# Patient Record
Sex: Female | Born: 1950 | Race: White | Hispanic: No | Marital: Married | Smoking: Current every day smoker
Health system: Southern US, Community
[De-identification: ages and names within clinical notes are randomized; demographics above are authoritative.]

## PROBLEM LIST (undated history)

## (undated) DIAGNOSIS — R634 Abnormal weight loss: Secondary | ICD-10-CM

## (undated) DIAGNOSIS — F419 Anxiety disorder, unspecified: Secondary | ICD-10-CM

## (undated) DIAGNOSIS — R0602 Shortness of breath: Secondary | ICD-10-CM

## (undated) DIAGNOSIS — K635 Polyp of colon: Secondary | ICD-10-CM

## (undated) DIAGNOSIS — C3490 Malignant neoplasm of unspecified part of unspecified bronchus or lung: Secondary | ICD-10-CM

## (undated) DIAGNOSIS — I509 Heart failure, unspecified: Secondary | ICD-10-CM

## (undated) DIAGNOSIS — R519 Other chronic pain: Secondary | ICD-10-CM

## (undated) DIAGNOSIS — Z923 Personal history of irradiation: Secondary | ICD-10-CM

## (undated) DIAGNOSIS — M1990 Unspecified osteoarthritis, unspecified site: Secondary | ICD-10-CM

## (undated) DIAGNOSIS — M545 Low back pain, unspecified: Secondary | ICD-10-CM

## (undated) DIAGNOSIS — J449 Chronic obstructive pulmonary disease, unspecified: Secondary | ICD-10-CM

## (undated) DIAGNOSIS — Z9981 Dependence on supplemental oxygen: Secondary | ICD-10-CM

## (undated) DIAGNOSIS — F1910 Other psychoactive substance abuse, uncomplicated: Secondary | ICD-10-CM

## (undated) DIAGNOSIS — S270XXA Traumatic pneumothorax, initial encounter: Secondary | ICD-10-CM

## (undated) DIAGNOSIS — M8700 Idiopathic aseptic necrosis of unspecified bone: Secondary | ICD-10-CM

## (undated) DIAGNOSIS — R51 Headache: Secondary | ICD-10-CM

## (undated) DIAGNOSIS — G8929 Other chronic pain: Secondary | ICD-10-CM

## (undated) DIAGNOSIS — J441 Chronic obstructive pulmonary disease with (acute) exacerbation: Secondary | ICD-10-CM

## (undated) DIAGNOSIS — I10 Essential (primary) hypertension: Secondary | ICD-10-CM

## (undated) DIAGNOSIS — F329 Major depressive disorder, single episode, unspecified: Secondary | ICD-10-CM

## (undated) DIAGNOSIS — F3289 Other specified depressive episodes: Secondary | ICD-10-CM

## (undated) DIAGNOSIS — F17200 Nicotine dependence, unspecified, uncomplicated: Secondary | ICD-10-CM

## (undated) HISTORY — DX: Abnormal weight loss: R63.4

## (undated) HISTORY — DX: Low back pain: M54.5

## (undated) HISTORY — DX: Other specified depressive episodes: F32.89

## (undated) HISTORY — DX: Other psychoactive substance abuse, uncomplicated: F19.10

## (undated) HISTORY — DX: Chronic obstructive pulmonary disease with (acute) exacerbation: J44.1

## (undated) HISTORY — PX: HIP SURGERY: SHX245

## (undated) HISTORY — PX: BACK SURGERY: SHX140

## (undated) HISTORY — PX: VESICOVAGINAL FISTULA CLOSURE W/ TAH: SUR271

## (undated) HISTORY — DX: Personal history of irradiation: Z92.3

## (undated) HISTORY — DX: Polyp of colon: K63.5

## (undated) HISTORY — DX: Low back pain, unspecified: M54.50

## (undated) HISTORY — DX: Nicotine dependence, unspecified, uncomplicated: F17.200

## (undated) HISTORY — DX: Unspecified osteoarthritis, unspecified site: M19.90

## (undated) HISTORY — DX: Anxiety disorder, unspecified: F41.9

## (undated) HISTORY — DX: Traumatic pneumothorax, initial encounter: S27.0XXA

## (undated) HISTORY — DX: Major depressive disorder, single episode, unspecified: F32.9

## (undated) HISTORY — DX: Idiopathic aseptic necrosis of unspecified bone: M87.00

## (undated) HISTORY — DX: Essential (primary) hypertension: I10

## (undated) HISTORY — DX: Chronic obstructive pulmonary disease, unspecified: J44.9

## (undated) HISTORY — PX: TUBAL LIGATION: SHX77

## (undated) HISTORY — PX: CHOLECYSTECTOMY: SHX55

## (undated) HISTORY — DX: Heart failure, unspecified: I50.9

## (undated) HISTORY — PX: TONSILECTOMY, ADENOIDECTOMY, BILATERAL MYRINGOTOMY AND TUBES: SHX2538

---

## 1997-11-26 ENCOUNTER — Emergency Department (HOSPITAL_COMMUNITY): Admission: EM | Admit: 1997-11-26 | Discharge: 1997-11-26 | Payer: Self-pay | Admitting: Emergency Medicine

## 1998-02-07 ENCOUNTER — Emergency Department (HOSPITAL_COMMUNITY): Admission: EM | Admit: 1998-02-07 | Discharge: 1998-02-07 | Payer: Self-pay | Admitting: Emergency Medicine

## 1998-03-06 ENCOUNTER — Emergency Department (HOSPITAL_COMMUNITY): Admission: EM | Admit: 1998-03-06 | Discharge: 1998-03-06 | Payer: Self-pay | Admitting: Endocrinology

## 1998-05-07 ENCOUNTER — Emergency Department (HOSPITAL_COMMUNITY): Admission: EM | Admit: 1998-05-07 | Discharge: 1998-05-07 | Payer: Self-pay | Admitting: Emergency Medicine

## 1998-08-07 DIAGNOSIS — F17200 Nicotine dependence, unspecified, uncomplicated: Secondary | ICD-10-CM

## 1998-08-07 HISTORY — DX: Nicotine dependence, unspecified, uncomplicated: F17.200

## 1999-02-01 ENCOUNTER — Emergency Department (HOSPITAL_COMMUNITY): Admission: EM | Admit: 1999-02-01 | Discharge: 1999-02-01 | Payer: Self-pay | Admitting: Emergency Medicine

## 1999-12-22 ENCOUNTER — Encounter: Payer: Self-pay | Admitting: Sports Medicine

## 1999-12-22 ENCOUNTER — Ambulatory Visit (HOSPITAL_COMMUNITY): Admission: RE | Admit: 1999-12-22 | Discharge: 1999-12-22 | Payer: Self-pay | Admitting: Sports Medicine

## 2000-01-05 ENCOUNTER — Encounter: Payer: Self-pay | Admitting: Sports Medicine

## 2000-01-05 ENCOUNTER — Ambulatory Visit (HOSPITAL_COMMUNITY): Admission: RE | Admit: 2000-01-05 | Discharge: 2000-01-05 | Payer: Self-pay | Admitting: Sports Medicine

## 2000-01-27 ENCOUNTER — Other Ambulatory Visit: Admission: RE | Admit: 2000-01-27 | Discharge: 2000-01-27 | Payer: Self-pay | Admitting: *Deleted

## 2000-02-02 ENCOUNTER — Encounter: Payer: Self-pay | Admitting: *Deleted

## 2000-02-02 ENCOUNTER — Encounter: Admission: RE | Admit: 2000-02-02 | Discharge: 2000-02-02 | Payer: Self-pay | Admitting: *Deleted

## 2000-09-01 ENCOUNTER — Encounter: Payer: Self-pay | Admitting: Emergency Medicine

## 2000-09-01 ENCOUNTER — Observation Stay (HOSPITAL_COMMUNITY): Admission: EM | Admit: 2000-09-01 | Discharge: 2000-09-02 | Payer: Self-pay | Admitting: Emergency Medicine

## 2000-12-10 ENCOUNTER — Encounter: Admission: RE | Admit: 2000-12-10 | Discharge: 2000-12-10 | Payer: Self-pay | Admitting: Neurosurgery

## 2000-12-10 ENCOUNTER — Encounter: Payer: Self-pay | Admitting: Neurosurgery

## 2000-12-25 ENCOUNTER — Encounter: Payer: Self-pay | Admitting: Neurosurgery

## 2000-12-25 ENCOUNTER — Encounter: Admission: RE | Admit: 2000-12-25 | Discharge: 2000-12-25 | Payer: Self-pay | Admitting: Neurosurgery

## 2001-01-22 ENCOUNTER — Encounter: Admission: RE | Admit: 2001-01-22 | Discharge: 2001-01-22 | Payer: Self-pay | Admitting: Neurosurgery

## 2001-01-22 ENCOUNTER — Encounter: Payer: Self-pay | Admitting: Neurosurgery

## 2001-02-14 ENCOUNTER — Encounter: Payer: Self-pay | Admitting: Neurosurgery

## 2001-02-14 ENCOUNTER — Encounter: Admission: RE | Admit: 2001-02-14 | Discharge: 2001-02-14 | Payer: Self-pay | Admitting: Neurosurgery

## 2001-03-04 ENCOUNTER — Encounter: Payer: Self-pay | Admitting: Neurosurgery

## 2001-03-07 ENCOUNTER — Encounter: Payer: Self-pay | Admitting: Neurosurgery

## 2001-03-07 ENCOUNTER — Inpatient Hospital Stay (HOSPITAL_COMMUNITY): Admission: RE | Admit: 2001-03-07 | Discharge: 2001-03-08 | Payer: Self-pay | Admitting: Neurosurgery

## 2001-04-01 ENCOUNTER — Ambulatory Visit (HOSPITAL_COMMUNITY): Admission: RE | Admit: 2001-04-01 | Discharge: 2001-04-01 | Payer: Self-pay | Admitting: Neurosurgery

## 2001-04-01 ENCOUNTER — Encounter: Payer: Self-pay | Admitting: Neurosurgery

## 2001-05-01 ENCOUNTER — Encounter: Payer: Self-pay | Admitting: Neurosurgery

## 2001-05-01 ENCOUNTER — Ambulatory Visit (HOSPITAL_COMMUNITY): Admission: RE | Admit: 2001-05-01 | Discharge: 2001-05-01 | Payer: Self-pay | Admitting: Neurosurgery

## 2001-05-23 ENCOUNTER — Emergency Department (HOSPITAL_COMMUNITY): Admission: EM | Admit: 2001-05-23 | Discharge: 2001-05-23 | Payer: Self-pay | Admitting: Emergency Medicine

## 2001-07-15 ENCOUNTER — Encounter: Payer: Self-pay | Admitting: Neurosurgery

## 2001-07-15 ENCOUNTER — Ambulatory Visit (HOSPITAL_COMMUNITY): Admission: RE | Admit: 2001-07-15 | Discharge: 2001-07-15 | Payer: Self-pay | Admitting: Neurosurgery

## 2002-01-01 ENCOUNTER — Encounter: Payer: Self-pay | Admitting: Neurosurgery

## 2002-01-01 ENCOUNTER — Ambulatory Visit (HOSPITAL_COMMUNITY): Admission: RE | Admit: 2002-01-01 | Discharge: 2002-01-01 | Payer: Self-pay | Admitting: Neurosurgery

## 2002-03-12 ENCOUNTER — Emergency Department (HOSPITAL_COMMUNITY): Admission: EM | Admit: 2002-03-12 | Discharge: 2002-03-12 | Payer: Self-pay | Admitting: *Deleted

## 2002-03-12 ENCOUNTER — Encounter: Payer: Self-pay | Admitting: *Deleted

## 2002-05-10 ENCOUNTER — Emergency Department (HOSPITAL_COMMUNITY): Admission: EM | Admit: 2002-05-10 | Discharge: 2002-05-11 | Payer: Self-pay | Admitting: Emergency Medicine

## 2002-08-29 ENCOUNTER — Encounter: Admission: RE | Admit: 2002-08-29 | Discharge: 2002-08-29 | Payer: Self-pay | Admitting: Family Medicine

## 2002-09-10 ENCOUNTER — Encounter: Admission: RE | Admit: 2002-09-10 | Discharge: 2002-09-10 | Payer: Self-pay | Admitting: Family Medicine

## 2002-09-16 ENCOUNTER — Encounter: Payer: Self-pay | Admitting: Emergency Medicine

## 2002-09-16 ENCOUNTER — Emergency Department (HOSPITAL_COMMUNITY): Admission: EM | Admit: 2002-09-16 | Discharge: 2002-09-17 | Payer: Self-pay | Admitting: *Deleted

## 2002-09-16 ENCOUNTER — Encounter: Payer: Self-pay | Admitting: *Deleted

## 2002-09-19 ENCOUNTER — Encounter: Admission: RE | Admit: 2002-09-19 | Discharge: 2002-09-19 | Payer: Self-pay | Admitting: Family Medicine

## 2002-10-06 ENCOUNTER — Encounter: Admission: RE | Admit: 2002-10-06 | Discharge: 2002-10-06 | Payer: Self-pay | Admitting: Family Medicine

## 2002-10-06 ENCOUNTER — Encounter (INDEPENDENT_AMBULATORY_CARE_PROVIDER_SITE_OTHER): Payer: Self-pay | Admitting: *Deleted

## 2002-10-06 LAB — CONVERTED CEMR LAB

## 2002-10-17 ENCOUNTER — Encounter: Admission: RE | Admit: 2002-10-17 | Discharge: 2002-10-17 | Payer: Self-pay | Admitting: Family Medicine

## 2002-10-20 ENCOUNTER — Encounter: Admission: RE | Admit: 2002-10-20 | Discharge: 2002-10-20 | Payer: Self-pay | Admitting: Family Medicine

## 2002-10-20 ENCOUNTER — Encounter: Payer: Self-pay | Admitting: Family Medicine

## 2002-11-28 ENCOUNTER — Encounter: Admission: RE | Admit: 2002-11-28 | Discharge: 2002-11-28 | Payer: Self-pay | Admitting: Specialist

## 2002-11-28 ENCOUNTER — Encounter: Payer: Self-pay | Admitting: Specialist

## 2002-12-02 ENCOUNTER — Encounter: Admission: RE | Admit: 2002-12-02 | Discharge: 2002-12-02 | Payer: Self-pay | Admitting: Family Medicine

## 2002-12-30 ENCOUNTER — Encounter: Admission: RE | Admit: 2002-12-30 | Discharge: 2002-12-30 | Payer: Self-pay | Admitting: Family Medicine

## 2003-01-27 ENCOUNTER — Encounter: Admission: RE | Admit: 2003-01-27 | Discharge: 2003-01-27 | Payer: Self-pay | Admitting: Family Medicine

## 2003-02-05 HISTORY — PX: LAMINECTOMY: SHX219

## 2003-02-19 ENCOUNTER — Inpatient Hospital Stay (HOSPITAL_COMMUNITY): Admission: RE | Admit: 2003-02-19 | Discharge: 2003-02-26 | Payer: Self-pay | Admitting: Specialist

## 2003-02-21 ENCOUNTER — Encounter: Payer: Self-pay | Admitting: Specialist

## 2003-02-22 ENCOUNTER — Encounter: Payer: Self-pay | Admitting: Pulmonary Disease

## 2003-02-23 ENCOUNTER — Encounter: Payer: Self-pay | Admitting: Pulmonary Disease

## 2003-02-23 ENCOUNTER — Encounter: Payer: Self-pay | Admitting: Cardiology

## 2003-03-03 ENCOUNTER — Encounter: Admission: RE | Admit: 2003-03-03 | Discharge: 2003-03-03 | Payer: Self-pay | Admitting: Family Medicine

## 2003-04-07 ENCOUNTER — Encounter: Admission: RE | Admit: 2003-04-07 | Discharge: 2003-04-07 | Payer: Self-pay | Admitting: Family Medicine

## 2003-05-05 ENCOUNTER — Encounter: Admission: RE | Admit: 2003-05-05 | Discharge: 2003-05-05 | Payer: Self-pay | Admitting: Family Medicine

## 2003-05-19 ENCOUNTER — Emergency Department (HOSPITAL_COMMUNITY): Admission: EM | Admit: 2003-05-19 | Discharge: 2003-05-19 | Payer: Self-pay | Admitting: *Deleted

## 2003-05-19 ENCOUNTER — Encounter: Payer: Self-pay | Admitting: *Deleted

## 2003-06-02 ENCOUNTER — Encounter: Admission: RE | Admit: 2003-06-02 | Discharge: 2003-06-02 | Payer: Self-pay | Admitting: Sports Medicine

## 2003-06-19 ENCOUNTER — Encounter: Admission: RE | Admit: 2003-06-19 | Discharge: 2003-07-09 | Payer: Self-pay | Admitting: Specialist

## 2003-07-24 ENCOUNTER — Encounter: Admission: RE | Admit: 2003-07-24 | Discharge: 2003-07-24 | Payer: Self-pay | Admitting: Sports Medicine

## 2003-09-05 ENCOUNTER — Emergency Department (HOSPITAL_COMMUNITY): Admission: EM | Admit: 2003-09-05 | Discharge: 2003-09-05 | Payer: Self-pay | Admitting: Emergency Medicine

## 2003-09-15 ENCOUNTER — Encounter: Admission: RE | Admit: 2003-09-15 | Discharge: 2003-09-15 | Payer: Self-pay | Admitting: Family Medicine

## 2003-09-16 ENCOUNTER — Encounter: Admission: RE | Admit: 2003-09-16 | Discharge: 2003-09-16 | Payer: Self-pay | Admitting: Specialist

## 2003-10-13 ENCOUNTER — Encounter: Admission: RE | Admit: 2003-10-13 | Discharge: 2003-10-13 | Payer: Self-pay | Admitting: Family Medicine

## 2003-11-10 ENCOUNTER — Encounter: Admission: RE | Admit: 2003-11-10 | Discharge: 2003-11-10 | Payer: Self-pay | Admitting: Family Medicine

## 2003-12-01 ENCOUNTER — Encounter: Admission: RE | Admit: 2003-12-01 | Discharge: 2003-12-01 | Payer: Self-pay | Admitting: Sports Medicine

## 2004-01-01 ENCOUNTER — Emergency Department (HOSPITAL_COMMUNITY): Admission: EM | Admit: 2004-01-01 | Discharge: 2004-01-01 | Payer: Self-pay | Admitting: Emergency Medicine

## 2004-01-12 ENCOUNTER — Encounter: Admission: RE | Admit: 2004-01-12 | Discharge: 2004-01-12 | Payer: Self-pay | Admitting: Sports Medicine

## 2004-02-02 ENCOUNTER — Emergency Department (HOSPITAL_COMMUNITY): Admission: EM | Admit: 2004-02-02 | Discharge: 2004-02-02 | Payer: Self-pay | Admitting: Emergency Medicine

## 2004-02-02 ENCOUNTER — Inpatient Hospital Stay (HOSPITAL_COMMUNITY): Admission: RE | Admit: 2004-02-02 | Discharge: 2004-02-07 | Payer: Self-pay | Admitting: Psychiatry

## 2004-03-01 ENCOUNTER — Encounter: Admission: RE | Admit: 2004-03-01 | Discharge: 2004-03-01 | Payer: Self-pay | Admitting: Sports Medicine

## 2004-09-15 ENCOUNTER — Ambulatory Visit: Payer: Self-pay | Admitting: Internal Medicine

## 2004-10-11 ENCOUNTER — Ambulatory Visit: Payer: Self-pay | Admitting: Family Medicine

## 2004-10-16 ENCOUNTER — Encounter: Admission: RE | Admit: 2004-10-16 | Discharge: 2004-10-16 | Payer: Self-pay | Admitting: Orthopaedic Surgery

## 2004-10-19 ENCOUNTER — Ambulatory Visit (HOSPITAL_COMMUNITY): Admission: RE | Admit: 2004-10-19 | Discharge: 2004-10-20 | Payer: Self-pay | Admitting: Orthopaedic Surgery

## 2004-10-27 ENCOUNTER — Ambulatory Visit (HOSPITAL_COMMUNITY): Admission: RE | Admit: 2004-10-27 | Discharge: 2004-10-27 | Payer: Self-pay | Admitting: Orthopaedic Surgery

## 2004-11-24 ENCOUNTER — Emergency Department (HOSPITAL_COMMUNITY): Admission: EM | Admit: 2004-11-24 | Discharge: 2004-11-25 | Payer: Self-pay | Admitting: Emergency Medicine

## 2004-11-29 ENCOUNTER — Ambulatory Visit: Payer: Self-pay | Admitting: Family Medicine

## 2004-12-14 ENCOUNTER — Ambulatory Visit (HOSPITAL_COMMUNITY): Admission: RE | Admit: 2004-12-14 | Discharge: 2004-12-14 | Payer: Self-pay | Admitting: Family Medicine

## 2004-12-14 ENCOUNTER — Ambulatory Visit: Payer: Self-pay | Admitting: Family Medicine

## 2005-06-08 ENCOUNTER — Ambulatory Visit: Payer: Self-pay | Admitting: Family Medicine

## 2005-06-18 ENCOUNTER — Emergency Department (HOSPITAL_COMMUNITY): Admission: EM | Admit: 2005-06-18 | Discharge: 2005-06-18 | Payer: Self-pay | Admitting: *Deleted

## 2005-06-19 ENCOUNTER — Emergency Department (HOSPITAL_COMMUNITY): Admission: EM | Admit: 2005-06-19 | Discharge: 2005-06-20 | Payer: Self-pay | Admitting: Emergency Medicine

## 2005-07-03 ENCOUNTER — Inpatient Hospital Stay (HOSPITAL_COMMUNITY): Admission: RE | Admit: 2005-07-03 | Discharge: 2005-07-06 | Payer: Self-pay | Admitting: Psychiatry

## 2005-07-03 ENCOUNTER — Emergency Department (HOSPITAL_COMMUNITY): Admission: EM | Admit: 2005-07-03 | Discharge: 2005-07-03 | Payer: Self-pay | Admitting: Emergency Medicine

## 2005-07-04 ENCOUNTER — Ambulatory Visit: Payer: Self-pay | Admitting: Psychiatry

## 2005-11-20 ENCOUNTER — Emergency Department (HOSPITAL_COMMUNITY): Admission: EM | Admit: 2005-11-20 | Discharge: 2005-11-20 | Payer: Self-pay | Admitting: Emergency Medicine

## 2006-04-06 ENCOUNTER — Ambulatory Visit: Payer: Self-pay | Admitting: Family Medicine

## 2006-04-22 ENCOUNTER — Emergency Department (HOSPITAL_COMMUNITY): Admission: EM | Admit: 2006-04-22 | Discharge: 2006-04-22 | Payer: Self-pay | Admitting: Family Medicine

## 2006-05-06 ENCOUNTER — Emergency Department (HOSPITAL_COMMUNITY): Admission: EM | Admit: 2006-05-06 | Discharge: 2006-05-06 | Payer: Self-pay | Admitting: Emergency Medicine

## 2006-05-11 ENCOUNTER — Ambulatory Visit (HOSPITAL_COMMUNITY): Admission: RE | Admit: 2006-05-11 | Discharge: 2006-05-12 | Payer: Self-pay

## 2006-05-11 ENCOUNTER — Encounter (INDEPENDENT_AMBULATORY_CARE_PROVIDER_SITE_OTHER): Payer: Self-pay | Admitting: Specialist

## 2006-05-12 ENCOUNTER — Encounter (INDEPENDENT_AMBULATORY_CARE_PROVIDER_SITE_OTHER): Payer: Self-pay | Admitting: *Deleted

## 2006-05-23 ENCOUNTER — Emergency Department (HOSPITAL_COMMUNITY): Admission: EM | Admit: 2006-05-23 | Discharge: 2006-05-23 | Payer: Self-pay | Admitting: Family Medicine

## 2006-10-04 DIAGNOSIS — R638 Other symptoms and signs concerning food and fluid intake: Secondary | ICD-10-CM | POA: Insufficient documentation

## 2006-10-04 DIAGNOSIS — J438 Other emphysema: Secondary | ICD-10-CM | POA: Insufficient documentation

## 2006-10-04 DIAGNOSIS — F411 Generalized anxiety disorder: Secondary | ICD-10-CM | POA: Insufficient documentation

## 2006-10-04 DIAGNOSIS — M545 Low back pain: Secondary | ICD-10-CM

## 2006-10-04 DIAGNOSIS — K5900 Constipation, unspecified: Secondary | ICD-10-CM | POA: Insufficient documentation

## 2006-10-04 DIAGNOSIS — F17200 Nicotine dependence, unspecified, uncomplicated: Secondary | ICD-10-CM | POA: Insufficient documentation

## 2006-10-04 DIAGNOSIS — F329 Major depressive disorder, single episode, unspecified: Secondary | ICD-10-CM | POA: Insufficient documentation

## 2006-10-04 DIAGNOSIS — I10 Essential (primary) hypertension: Secondary | ICD-10-CM | POA: Insufficient documentation

## 2006-10-05 ENCOUNTER — Encounter (INDEPENDENT_AMBULATORY_CARE_PROVIDER_SITE_OTHER): Payer: Self-pay | Admitting: *Deleted

## 2006-10-15 ENCOUNTER — Encounter: Payer: Self-pay | Admitting: Family Medicine

## 2006-11-21 ENCOUNTER — Inpatient Hospital Stay (HOSPITAL_COMMUNITY): Admission: EM | Admit: 2006-11-21 | Discharge: 2006-11-23 | Payer: Self-pay | Admitting: Emergency Medicine

## 2006-12-26 ENCOUNTER — Ambulatory Visit (HOSPITAL_COMMUNITY): Admission: RE | Admit: 2006-12-26 | Discharge: 2006-12-26 | Payer: Self-pay | Admitting: Internal Medicine

## 2006-12-26 ENCOUNTER — Encounter (INDEPENDENT_AMBULATORY_CARE_PROVIDER_SITE_OTHER): Payer: Self-pay | Admitting: *Deleted

## 2007-03-08 ENCOUNTER — Emergency Department (HOSPITAL_COMMUNITY): Admission: EM | Admit: 2007-03-08 | Discharge: 2007-03-08 | Payer: Self-pay | Admitting: Emergency Medicine

## 2007-03-08 ENCOUNTER — Encounter (INDEPENDENT_AMBULATORY_CARE_PROVIDER_SITE_OTHER): Payer: Self-pay | Admitting: *Deleted

## 2007-03-12 ENCOUNTER — Encounter (INDEPENDENT_AMBULATORY_CARE_PROVIDER_SITE_OTHER): Payer: Self-pay | Admitting: *Deleted

## 2007-03-12 ENCOUNTER — Ambulatory Visit (HOSPITAL_COMMUNITY): Admission: RE | Admit: 2007-03-12 | Discharge: 2007-03-12 | Payer: Self-pay | Admitting: Internal Medicine

## 2007-03-16 ENCOUNTER — Encounter (INDEPENDENT_AMBULATORY_CARE_PROVIDER_SITE_OTHER): Payer: Self-pay | Admitting: *Deleted

## 2007-03-16 ENCOUNTER — Emergency Department (HOSPITAL_COMMUNITY): Admission: EM | Admit: 2007-03-16 | Discharge: 2007-03-16 | Payer: Self-pay | Admitting: Emergency Medicine

## 2007-03-27 ENCOUNTER — Ambulatory Visit: Payer: Self-pay | Admitting: Internal Medicine

## 2007-03-27 LAB — CONVERTED CEMR LAB
Albumin: 4 g/dL (ref 3.5–5.2)
Amylase: 113 units/L (ref 27–131)
CEA: 3.5 ng/mL (ref 0.0–5.0)
Total Bilirubin: 0.9 mg/dL (ref 0.3–1.2)

## 2007-03-28 ENCOUNTER — Ambulatory Visit: Payer: Self-pay

## 2007-04-19 ENCOUNTER — Ambulatory Visit (HOSPITAL_COMMUNITY): Admission: RE | Admit: 2007-04-19 | Discharge: 2007-04-19 | Payer: Self-pay | Admitting: Internal Medicine

## 2007-04-19 ENCOUNTER — Encounter: Payer: Self-pay | Admitting: Internal Medicine

## 2007-04-24 ENCOUNTER — Ambulatory Visit: Payer: Self-pay | Admitting: Internal Medicine

## 2007-05-10 ENCOUNTER — Emergency Department (HOSPITAL_COMMUNITY): Admission: EM | Admit: 2007-05-10 | Discharge: 2007-05-10 | Payer: Self-pay | Admitting: *Deleted

## 2007-05-21 ENCOUNTER — Emergency Department (HOSPITAL_COMMUNITY): Admission: EM | Admit: 2007-05-21 | Discharge: 2007-05-21 | Payer: Self-pay | Admitting: Emergency Medicine

## 2007-05-31 ENCOUNTER — Emergency Department (HOSPITAL_COMMUNITY): Admission: EM | Admit: 2007-05-31 | Discharge: 2007-05-31 | Payer: Self-pay | Admitting: *Deleted

## 2007-06-07 ENCOUNTER — Ambulatory Visit: Payer: Self-pay | Admitting: Internal Medicine

## 2007-06-17 ENCOUNTER — Encounter: Payer: Self-pay | Admitting: Internal Medicine

## 2007-06-17 ENCOUNTER — Ambulatory Visit: Payer: Self-pay | Admitting: Internal Medicine

## 2007-06-17 DIAGNOSIS — D126 Benign neoplasm of colon, unspecified: Secondary | ICD-10-CM

## 2007-07-07 ENCOUNTER — Emergency Department (HOSPITAL_COMMUNITY): Admission: EM | Admit: 2007-07-07 | Discharge: 2007-07-07 | Payer: Self-pay | Admitting: *Deleted

## 2007-10-03 DIAGNOSIS — R519 Headache, unspecified: Secondary | ICD-10-CM | POA: Insufficient documentation

## 2007-10-03 DIAGNOSIS — J449 Chronic obstructive pulmonary disease, unspecified: Secondary | ICD-10-CM

## 2007-10-03 DIAGNOSIS — Q8500 Neurofibromatosis, unspecified: Secondary | ICD-10-CM | POA: Insufficient documentation

## 2007-10-03 DIAGNOSIS — J4489 Other specified chronic obstructive pulmonary disease: Secondary | ICD-10-CM | POA: Insufficient documentation

## 2007-10-03 DIAGNOSIS — Z9981 Dependence on supplemental oxygen: Secondary | ICD-10-CM

## 2007-10-03 DIAGNOSIS — R51 Headache: Secondary | ICD-10-CM

## 2007-10-03 DIAGNOSIS — F1910 Other psychoactive substance abuse, uncomplicated: Secondary | ICD-10-CM

## 2007-10-03 DIAGNOSIS — I509 Heart failure, unspecified: Secondary | ICD-10-CM | POA: Insufficient documentation

## 2007-10-18 ENCOUNTER — Ambulatory Visit: Payer: Self-pay | Admitting: Internal Medicine

## 2007-11-14 ENCOUNTER — Encounter
Admission: RE | Admit: 2007-11-14 | Discharge: 2007-11-14 | Payer: Self-pay | Admitting: Physical Medicine & Rehabilitation

## 2007-11-19 ENCOUNTER — Inpatient Hospital Stay (HOSPITAL_COMMUNITY): Admission: EM | Admit: 2007-11-19 | Discharge: 2007-11-28 | Payer: Self-pay | Admitting: Emergency Medicine

## 2007-11-25 ENCOUNTER — Ambulatory Visit: Payer: Self-pay | Admitting: Physical Medicine & Rehabilitation

## 2008-01-29 ENCOUNTER — Encounter: Admission: RE | Admit: 2008-01-29 | Discharge: 2008-01-29 | Payer: Self-pay | Admitting: Specialist

## 2008-04-03 ENCOUNTER — Encounter
Admission: RE | Admit: 2008-04-03 | Discharge: 2008-07-02 | Payer: Self-pay | Admitting: Physical Medicine & Rehabilitation

## 2008-04-06 ENCOUNTER — Ambulatory Visit: Payer: Self-pay | Admitting: Physical Medicine & Rehabilitation

## 2008-04-29 ENCOUNTER — Emergency Department (HOSPITAL_COMMUNITY): Admission: EM | Admit: 2008-04-29 | Discharge: 2008-04-29 | Payer: Self-pay | Admitting: Family Medicine

## 2008-05-06 ENCOUNTER — Ambulatory Visit: Payer: Self-pay | Admitting: Physical Medicine & Rehabilitation

## 2008-06-05 ENCOUNTER — Ambulatory Visit: Payer: Self-pay | Admitting: Physical Medicine & Rehabilitation

## 2008-07-06 ENCOUNTER — Ambulatory Visit: Payer: Self-pay | Admitting: Physical Medicine & Rehabilitation

## 2008-07-06 ENCOUNTER — Encounter
Admission: RE | Admit: 2008-07-06 | Discharge: 2008-10-04 | Payer: Self-pay | Admitting: Physical Medicine & Rehabilitation

## 2008-07-17 ENCOUNTER — Emergency Department (HOSPITAL_COMMUNITY): Admission: EM | Admit: 2008-07-17 | Discharge: 2008-07-17 | Payer: Self-pay | Admitting: Emergency Medicine

## 2008-08-03 ENCOUNTER — Ambulatory Visit: Payer: Self-pay | Admitting: Physical Medicine & Rehabilitation

## 2008-09-02 ENCOUNTER — Ambulatory Visit: Payer: Self-pay | Admitting: Physical Medicine & Rehabilitation

## 2008-09-18 ENCOUNTER — Emergency Department (HOSPITAL_COMMUNITY): Admission: EM | Admit: 2008-09-18 | Discharge: 2008-09-18 | Payer: Self-pay | Admitting: Emergency Medicine

## 2008-09-22 ENCOUNTER — Emergency Department (HOSPITAL_COMMUNITY): Admission: EM | Admit: 2008-09-22 | Discharge: 2008-09-22 | Payer: Self-pay | Admitting: Emergency Medicine

## 2008-10-02 ENCOUNTER — Ambulatory Visit: Payer: Self-pay | Admitting: Physical Medicine & Rehabilitation

## 2008-11-02 ENCOUNTER — Ambulatory Visit: Payer: Self-pay | Admitting: Physical Medicine & Rehabilitation

## 2008-11-02 ENCOUNTER — Encounter
Admission: RE | Admit: 2008-11-02 | Discharge: 2009-01-26 | Payer: Self-pay | Admitting: Physical Medicine & Rehabilitation

## 2008-11-06 ENCOUNTER — Ambulatory Visit (HOSPITAL_COMMUNITY)
Admission: RE | Admit: 2008-11-06 | Discharge: 2008-11-06 | Payer: Self-pay | Admitting: Physical Medicine & Rehabilitation

## 2008-12-04 ENCOUNTER — Ambulatory Visit: Payer: Self-pay | Admitting: Physical Medicine & Rehabilitation

## 2009-01-08 ENCOUNTER — Ambulatory Visit: Payer: Self-pay | Admitting: Physical Medicine & Rehabilitation

## 2009-01-26 ENCOUNTER — Encounter
Admission: RE | Admit: 2009-01-26 | Discharge: 2009-04-26 | Payer: Self-pay | Admitting: Physical Medicine & Rehabilitation

## 2009-01-29 ENCOUNTER — Ambulatory Visit: Payer: Self-pay | Admitting: Physical Medicine & Rehabilitation

## 2009-02-23 ENCOUNTER — Other Ambulatory Visit (HOSPITAL_COMMUNITY): Admission: RE | Admit: 2009-02-23 | Discharge: 2009-03-15 | Payer: Self-pay | Admitting: Psychiatry

## 2009-02-24 ENCOUNTER — Ambulatory Visit: Payer: Self-pay | Admitting: Psychiatry

## 2009-03-17 ENCOUNTER — Ambulatory Visit: Payer: Self-pay | Admitting: Physical Medicine & Rehabilitation

## 2009-03-27 ENCOUNTER — Emergency Department (HOSPITAL_COMMUNITY): Admission: EM | Admit: 2009-03-27 | Discharge: 2009-03-27 | Payer: Self-pay | Admitting: Emergency Medicine

## 2009-04-14 ENCOUNTER — Ambulatory Visit: Payer: Self-pay | Admitting: Physical Medicine & Rehabilitation

## 2009-05-06 ENCOUNTER — Encounter
Admission: RE | Admit: 2009-05-06 | Discharge: 2009-07-29 | Payer: Self-pay | Admitting: Physical Medicine & Rehabilitation

## 2009-05-14 ENCOUNTER — Ambulatory Visit: Payer: Self-pay | Admitting: Physical Medicine & Rehabilitation

## 2009-06-10 ENCOUNTER — Ambulatory Visit: Payer: Self-pay | Admitting: Physical Medicine & Rehabilitation

## 2009-07-09 ENCOUNTER — Ambulatory Visit: Payer: Self-pay | Admitting: Physical Medicine & Rehabilitation

## 2009-07-27 ENCOUNTER — Encounter
Admission: RE | Admit: 2009-07-27 | Discharge: 2009-08-05 | Payer: Self-pay | Admitting: Physical Medicine & Rehabilitation

## 2009-08-05 ENCOUNTER — Ambulatory Visit: Payer: Self-pay | Admitting: Physical Medicine & Rehabilitation

## 2009-08-25 ENCOUNTER — Encounter
Admission: RE | Admit: 2009-08-25 | Discharge: 2009-11-23 | Payer: Self-pay | Admitting: Physical Medicine & Rehabilitation

## 2009-09-01 ENCOUNTER — Ambulatory Visit: Payer: Self-pay | Admitting: Physical Medicine & Rehabilitation

## 2009-10-05 ENCOUNTER — Ambulatory Visit: Payer: Self-pay | Admitting: Physical Medicine & Rehabilitation

## 2009-10-21 ENCOUNTER — Ambulatory Visit: Payer: Self-pay | Admitting: Pulmonary Disease

## 2009-10-21 ENCOUNTER — Ambulatory Visit: Payer: Self-pay | Admitting: Cardiothoracic Surgery

## 2009-10-21 ENCOUNTER — Inpatient Hospital Stay (HOSPITAL_COMMUNITY): Admission: EM | Admit: 2009-10-21 | Discharge: 2009-11-05 | Payer: Self-pay | Admitting: Emergency Medicine

## 2009-10-23 ENCOUNTER — Encounter (INDEPENDENT_AMBULATORY_CARE_PROVIDER_SITE_OTHER): Payer: Self-pay | Admitting: Internal Medicine

## 2009-11-01 ENCOUNTER — Encounter (INDEPENDENT_AMBULATORY_CARE_PROVIDER_SITE_OTHER): Payer: Self-pay | Admitting: Internal Medicine

## 2009-11-10 ENCOUNTER — Ambulatory Visit: Payer: Self-pay | Admitting: Thoracic Surgery

## 2009-11-10 ENCOUNTER — Encounter: Admission: RE | Admit: 2009-11-10 | Discharge: 2009-11-10 | Payer: Self-pay | Admitting: Thoracic Surgery

## 2009-11-24 ENCOUNTER — Ambulatory Visit (HOSPITAL_COMMUNITY): Admission: RE | Admit: 2009-11-24 | Discharge: 2009-11-24 | Payer: Self-pay | Admitting: Thoracic Surgery

## 2009-11-24 ENCOUNTER — Ambulatory Visit: Payer: Self-pay | Admitting: Thoracic Surgery

## 2009-11-29 ENCOUNTER — Emergency Department (HOSPITAL_COMMUNITY): Admission: EM | Admit: 2009-11-29 | Discharge: 2009-11-29 | Payer: Self-pay | Admitting: Emergency Medicine

## 2009-12-01 ENCOUNTER — Encounter: Payer: Self-pay | Admitting: Internal Medicine

## 2009-12-01 ENCOUNTER — Encounter: Admission: RE | Admit: 2009-12-01 | Discharge: 2009-12-01 | Payer: Self-pay | Admitting: Thoracic Surgery

## 2009-12-01 ENCOUNTER — Ambulatory Visit: Payer: Self-pay | Admitting: Thoracic Surgery

## 2009-12-29 ENCOUNTER — Encounter
Admission: RE | Admit: 2009-12-29 | Discharge: 2010-03-29 | Payer: Self-pay | Admitting: Physical Medicine & Rehabilitation

## 2009-12-31 ENCOUNTER — Ambulatory Visit: Payer: Self-pay | Admitting: Internal Medicine

## 2009-12-31 ENCOUNTER — Ambulatory Visit: Payer: Self-pay | Admitting: Physical Medicine & Rehabilitation

## 2010-01-06 ENCOUNTER — Encounter: Payer: Self-pay | Admitting: Internal Medicine

## 2010-02-01 ENCOUNTER — Ambulatory Visit: Payer: Self-pay | Admitting: Internal Medicine

## 2010-02-10 ENCOUNTER — Ambulatory Visit: Payer: Self-pay | Admitting: Physical Medicine & Rehabilitation

## 2010-03-17 ENCOUNTER — Ambulatory Visit: Payer: Self-pay | Admitting: Physical Medicine & Rehabilitation

## 2010-04-05 ENCOUNTER — Encounter (INDEPENDENT_AMBULATORY_CARE_PROVIDER_SITE_OTHER): Payer: Self-pay | Admitting: *Deleted

## 2010-04-05 ENCOUNTER — Ambulatory Visit: Payer: Self-pay | Admitting: Internal Medicine

## 2010-04-05 ENCOUNTER — Telehealth: Payer: Self-pay | Admitting: Internal Medicine

## 2010-04-06 ENCOUNTER — Encounter (INDEPENDENT_AMBULATORY_CARE_PROVIDER_SITE_OTHER): Payer: Self-pay | Admitting: *Deleted

## 2010-04-06 ENCOUNTER — Encounter: Admission: RE | Admit: 2010-04-06 | Discharge: 2010-04-06 | Payer: Self-pay | Admitting: Internal Medicine

## 2010-04-07 ENCOUNTER — Encounter (INDEPENDENT_AMBULATORY_CARE_PROVIDER_SITE_OTHER): Payer: Self-pay | Admitting: *Deleted

## 2010-04-15 ENCOUNTER — Encounter
Admission: RE | Admit: 2010-04-15 | Discharge: 2010-07-05 | Payer: Self-pay | Admitting: Physical Medicine & Rehabilitation

## 2010-04-20 ENCOUNTER — Ambulatory Visit: Payer: Self-pay | Admitting: Physical Medicine & Rehabilitation

## 2010-05-06 ENCOUNTER — Ambulatory Visit: Payer: Self-pay | Admitting: Internal Medicine

## 2010-05-06 DIAGNOSIS — Z8639 Personal history of other endocrine, nutritional and metabolic disease: Secondary | ICD-10-CM | POA: Insufficient documentation

## 2010-05-06 DIAGNOSIS — Z862 Personal history of diseases of the blood and blood-forming organs and certain disorders involving the immune mechanism: Secondary | ICD-10-CM

## 2010-05-13 LAB — CONVERTED CEMR LAB: A-1 Antitrypsin, Ser: 292 mg/dL — ABNORMAL HIGH (ref 83–200)

## 2010-05-18 ENCOUNTER — Emergency Department (HOSPITAL_COMMUNITY): Admission: EM | Admit: 2010-05-18 | Discharge: 2010-05-18 | Payer: Self-pay | Admitting: Family Medicine

## 2010-05-18 ENCOUNTER — Telehealth (INDEPENDENT_AMBULATORY_CARE_PROVIDER_SITE_OTHER): Payer: Self-pay | Admitting: *Deleted

## 2010-05-19 ENCOUNTER — Ambulatory Visit: Payer: Self-pay | Admitting: Physical Medicine & Rehabilitation

## 2010-05-31 ENCOUNTER — Encounter: Payer: Self-pay | Admitting: Cardiovascular Disease

## 2010-05-31 DIAGNOSIS — I739 Peripheral vascular disease, unspecified: Secondary | ICD-10-CM

## 2010-06-01 ENCOUNTER — Ambulatory Visit: Payer: Self-pay

## 2010-06-01 ENCOUNTER — Encounter: Payer: Self-pay | Admitting: Cardiovascular Disease

## 2010-06-16 ENCOUNTER — Ambulatory Visit: Payer: Self-pay | Admitting: Physical Medicine & Rehabilitation

## 2010-07-05 ENCOUNTER — Encounter
Admission: RE | Admit: 2010-07-05 | Discharge: 2010-08-06 | Payer: Self-pay | Source: Home / Self Care | Attending: Physical Medicine & Rehabilitation | Admitting: Physical Medicine & Rehabilitation

## 2010-07-13 ENCOUNTER — Ambulatory Visit: Payer: Self-pay | Admitting: Physical Medicine & Rehabilitation

## 2010-07-20 ENCOUNTER — Inpatient Hospital Stay (HOSPITAL_COMMUNITY)
Admission: EM | Admit: 2010-07-20 | Discharge: 2010-07-26 | Payer: Self-pay | Source: Home / Self Care | Attending: Internal Medicine | Admitting: Internal Medicine

## 2010-07-25 ENCOUNTER — Encounter: Payer: Self-pay | Admitting: Internal Medicine

## 2010-08-03 ENCOUNTER — Ambulatory Visit
Admission: RE | Admit: 2010-08-03 | Discharge: 2010-08-03 | Payer: Self-pay | Source: Home / Self Care | Attending: Physical Medicine & Rehabilitation | Admitting: Physical Medicine & Rehabilitation

## 2010-08-07 DIAGNOSIS — C3490 Malignant neoplasm of unspecified part of unspecified bronchus or lung: Secondary | ICD-10-CM

## 2010-08-07 HISTORY — DX: Malignant neoplasm of unspecified part of unspecified bronchus or lung: C34.90

## 2010-08-08 ENCOUNTER — Encounter: Payer: Self-pay | Admitting: Internal Medicine

## 2010-08-08 ENCOUNTER — Inpatient Hospital Stay (HOSPITAL_COMMUNITY)
Admission: EM | Admit: 2010-08-08 | Discharge: 2010-08-10 | Payer: Self-pay | Source: Home / Self Care | Attending: Internal Medicine | Admitting: Internal Medicine

## 2010-08-09 ENCOUNTER — Telehealth: Payer: Self-pay | Admitting: Internal Medicine

## 2010-08-09 ENCOUNTER — Encounter
Admission: RE | Admit: 2010-08-09 | Discharge: 2010-09-06 | Payer: Self-pay | Source: Home / Self Care | Attending: Physical Medicine & Rehabilitation | Admitting: Physical Medicine & Rehabilitation

## 2010-08-10 ENCOUNTER — Encounter (INDEPENDENT_AMBULATORY_CARE_PROVIDER_SITE_OTHER): Payer: Self-pay | Admitting: *Deleted

## 2010-08-10 LAB — GLUCOSE, CAPILLARY
Glucose-Capillary: 100 mg/dL — ABNORMAL HIGH (ref 70–99)
Glucose-Capillary: 83 mg/dL (ref 70–99)

## 2010-08-11 ENCOUNTER — Ambulatory Visit
Admission: RE | Admit: 2010-08-11 | Discharge: 2010-08-11 | Payer: Self-pay | Source: Home / Self Care | Attending: Internal Medicine | Admitting: Internal Medicine

## 2010-08-11 ENCOUNTER — Ambulatory Visit: Admit: 2010-08-11 | Payer: Self-pay | Admitting: Physical Medicine & Rehabilitation

## 2010-08-11 DIAGNOSIS — J984 Other disorders of lung: Secondary | ICD-10-CM | POA: Insufficient documentation

## 2010-08-19 ENCOUNTER — Ambulatory Visit: Admit: 2010-08-19 | Payer: Self-pay | Admitting: Internal Medicine

## 2010-08-23 ENCOUNTER — Encounter: Payer: Self-pay | Admitting: Internal Medicine

## 2010-08-28 ENCOUNTER — Encounter: Payer: Self-pay | Admitting: Internal Medicine

## 2010-09-01 ENCOUNTER — Ambulatory Visit: Admit: 2010-09-01 | Payer: Self-pay | Admitting: Physical Medicine & Rehabilitation

## 2010-09-02 ENCOUNTER — Telehealth: Payer: Self-pay | Admitting: Internal Medicine

## 2010-09-06 NOTE — Progress Notes (Signed)
Summary: Supporting lab dx codes  ---- Converted from flag ---- ---- 05/18/2010 2:54 PM, Hart Carwin MD wrote: [phone], 571.9, 790.4,794.8  ---- 05/18/2010 9:22 AM, Leodis Liverpool wrote: Could you please send supporting dx codes for Mitochondrial anti, AFP tumor marker, anti nuclear AB reflex, alpha-1 antitrypsin from 05/06/10, V12.2 was denied by insurance for payment.  Thanks. Darl Pikes ------------------------------

## 2010-09-06 NOTE — Miscellaneous (Signed)
Summary: Orders Update pft charges  Clinical Lists Changes  Orders: Added new Service order of Carbon Monoxide diffusing w/capacity (94720) - Signed Added new Service order of Lung Volumes (94240) - Signed Added new Service order of Spirometry (Pre & Post) (94060) - Signed 

## 2010-09-06 NOTE — Miscellaneous (Signed)
Summary: Orders Update pft charges  Clinical Lists Changes  Orders: Added new Service order of Lung Volumes (94240) - Signed Added new Service order of Spirometry (Pre & Post) (94060) - Signed 

## 2010-09-06 NOTE — Op Note (Signed)
Summary: ERCP  NAME:  [name], [name]               ACCOUNT NO.:  [id number]      MEDICAL RECORD NO.:  [id number]          PATIENT TYPE:  AMB      LOCATION:  ENDO                         FACILITY:  [facility]      PHYSICIAN:  Hedwig Morton. Juanda Chance, MD     DATE OF BIRTH:  08-19-1950      DATE OF PROCEDURE:  04/19/2007   DATE OF DISCHARGE:                                  OPERATIVE REPORT      PROCEDURE:  Endoscopic retrograde cholangiopancreatography.      INDICATIONS:  This 60 year old white female has had dilated intrahepatic   ducts on intraoperative cholangiogram during cholecystectomy two years   ago.  She has been complaining of recurrent abdominal pain and had mild   elevation of liver function tests.  CT scan of the abdomen recently   showed biliary, as well as pancreatic duct dilatation.  There was no   mass.  She is undergoing an ERCP to rule out a retained common bile duct   stone.  She also has COPD and neurofibromatosis.      ENDOSCOPE:  Olympus single-channel side-viewing duodenoscope.      SEDATION:  Versed 10 mg IV, fentanyl 150 micrograms IV, Phenergan 25 mg   IV, and Glucagon 0.5 mg IV.      FINDINGS:  Patient required a large amount of sedation prior to the   procedure because she has been on maintenance dose of Xanax 1 mg three   times a day.  Olympus single-channel side-viewing duodenoscope was   passed blindly through the posterior oropharynx into esophagus, into the   stomach.  Gastric mucosa appeared normal.  Pyloric channel was normal.   Biliary papilla was located without difficulty and showed normal size.   Small amount of bile was exiting from the papilla.  It was intubated   without difficulty and, with the help of a guide wire, we obtained a   cholangiogram, using fluoroscopic guidance.  Common bile duct was   visualized and was at least 15 mm dilated, but smoothly tapered without   evidence of obstruction.  Bile was easily flowing out of the common bile   duct.   A 15 mm Wilson-Cook balloon swept bile duct twice with recovery   of only normal-looking bile.  There were no stones or sludge.  Occlusion   cholangiogram was obtained.  Surgical clips were visualized next to the   common bile duct.  Small amount of extravasation was noted at the   papilla.  Pancreatic duct was avoided.  Internal hepatic radicles were   mildly prominent.  Common bile duct emptied rapidly.  After obtaining   occlusion cholangiogram and review of the spot films, which showed no   evidence of retained stones, the procedure was terminated.  The patient   tolerated the procedure well.      IMPRESSION:   1. Dilated, nonobstructed common bile duct with mild dilatation of       intrahepatic radicles.  No evidence of obstruction.   2. Main pancreatic duct not seen.  PLAN:  Although the common bile duct seemed to be prominent, it is not   likely to be causing the patient's abdominal pain.  I would suggest   further evaluation.  The pain seemed to be relieved by antispasmodics.   She is also on Vicodin for chronic back pain.  Her pain threshold may be   somewhat low because of chronic use of antianxiety medications, as well   as narcotics.  We will follow her liver function tests.               Hedwig Morton. Juanda Chance, MD   Electronically Signed            DMB/MEDQ  D:  04/19/2007  T:  04/19/2007  Job:  [id number]      cc:   Kendrick Ranch, M.D.   Fax: [phone]

## 2010-09-06 NOTE — Op Note (Signed)
Summary: Cholelithiasis   NAMEXIN, KLAWITTER               ACCOUNT NO.:  [id number]   MEDICAL RECORD NO.:  [id number]          PATIENT TYPE:  OIB   LOCATION:  5737                         FACILITY:  [facility]   PHYSICIAN:  Lebron Conners, M.D.   DATE OF BIRTH:  1950/10/08   DATE OF PROCEDURE:  05/11/2006  DATE OF DISCHARGE:  05/12/2006                                 OPERATIVE REPORT   PREOPERATIVE DIAGNOSIS:  Cholelithiasis   POSTOPERATIVE DIAGNOSIS:  Abdominal pain of uncertain etiology, probable  chronic cholecystitis   OPERATION:  Laparoscopic cholecystectomy with operative cholangiogram.   SURGEON:  Lebron Conners, M.D.   ANESTHESIA:  General and local.   BLOOD LOSS:  Minimal.   SPECIMEN:  Gallbladder.   COMPLICATIONS:  None.   PROCEDURE:  After the patient was monitored and asleep and had routine  preparation and draping of the abdomen,  I infiltrated local anesthetic just  below the umbilicus and made a short transverse incision.  I then exposed  the midline fascia and cut it for about 2 cm.  I opened the peritoneum  bluntly and placed a 0 Vicryl pursestring suture; secured a Hassan cannula  and inflated the abdomen with carbon dioxide.  There were some adhesions of  omentum and some filmy adhesions on the undersurface of the liver and  gallbladder.  I saw no other abnormalities.  The liver appeared to be  healthy.  The gallbladder appeared to be somewhat chronically inflamed.  I  put in 3 additional ports through anesthetized sites, and then retracted the  fundus of the gallbladder toward the right shoulder.  I took down the  adhesions and then grasped the fundus of the gallbladder, pulled it to the  right and dissected the hepatoduodenal ligament, until I clearly saw the  cystic duct emerging from the infundibulum and saw the cystic artery  crossing the triangle of Calot.  I clipped and divided the cystic artery; I  put a clip on the cystic duct and made a small cut  just distal to it and put  in a Cook cholangiogram catheter.  A fluoroscopic cholangiogram showed an  enlarged common bile duct with slow filling and slow emptying, but I saw no  stricture and saw no filling defects to suggest stones.  There was definite  easy flow into the duodenum.  I then resumed my laparoscopy and removed the  clip and the cholangiogram catheter.  I then clipped the distal cystic duct  with 3 clips and divided it.  I then dissected the gallbladder from the  liver with the hook and cautery.  I saw a couple more small vessels, which I  clipped and divided.  During the dissection I made a couple of small holes  in the gallbladder and some bile spilled; but, I did not see any stones  spill.  I then completed the detachment of the gallbladder from the liver  and placed it in a plastic pouch.  After copiously irrigating the right  upper quadrant and removing the irrigant, and assuring good hemostasis, I  removed the gallbladder from the  abdomen through the umbilical incision and  tied the pursestring suture.  I then removed the remaining small amount of  irrigant from the right gutter.  I removed the two lateral ports under  direct view and saw no bleeding from the belly wall.  I removed the  epigastric port after allowing the carbon dioxide to escape.  I closed all  skin incisions with intracuticular 4-0 Vicryl and Steri-Strips.  I felt the  gallbladder  and did not feel any obvious stones (feeling it in the pouch); but I sent it  to the lab intact for their examination.  The sponge, needle and instrument  counts were correct.  The patient tolerated the operation well and went to  PACU in stable condition.      Lebron Conners, M.D.  Electronically Signed     WB/MEDQ  D:  05/11/2006  T:  05/13/2006  Job:  [id number]

## 2010-09-06 NOTE — Assessment & Plan Note (Signed)
Summary: rov after pft ///kp   CC:  Follow up visit-unable to do PFT; Increased SOB; currently smoking 1/2 ppd. .  History of Present Illness:  History of Present Illness: Jan 22, 2010- 60 yoF smoker, former Scientist, product/process development, seen with husband at request of Dr Riley Kill for COPD. She was dx'd about 4 years ago, but continues to smoke. Prescribed continuous oxygen, but using it only at night. She started out by asking for "somethng for my nerves". Has smoked 40 years, now reduced from 2 PPD to 1/2 PPD. Was followed by a psychiatrist - quit, "didn't like him". Tried quitting cigs but failed patches and afraid to try Chantix due to her depression. Little cough, dry, with dyspnea on ADLs, sometimes wheeze. Little change from day to day. Breathing doesn't wake her. Doesn't try to exercise. Feels overdired- avoids Spiriva. Had chest tube for left pneumothorax after a fall on 10/21/09. Denies pneumonia. Had pneumovax more than once, flu vax each year. Limited by back pain- Dr Riley Kill, Pain Management.  February 01, 2010- COPD, neurofibromatosis, hx traumatic L PTX/ rib fx Scheduled today for PFT, but she had to defer due to residual pain left lateral ribs where fx'd and chest tube after falling in March, 2011.  Trying to stop smoking, cold [place],  but says nerves make it hard. Cough "little bit" clear mucus. Denies blood, fever.On arrival, O2 portable pulde regulated 82% sat, rose to 94% on continuous 3L.  Uses her nebulizer rarely. Has little change day to day and denies wheeze.    Preventive Screening-Counseling & Management  Alcohol-Tobacco     Smoking Status: current     Smoking Cessation Counseling: yes     Smoke Cessation Stage: contemplative     Packs/Day: 0.5     Tobacco Counseling: to quit use of tobacco products  Current Medications (verified): 1)  Diltiazem Hcl Er Beads 360 Mg Xr24h-Cap (Diltiazem Hcl Er Beads) .... Take 1 By Mouth Once Daily 2)  Nortriptyline Hcl 50 Mg Caps (Nortriptyline  Hcl) .... Take 2 By Mouth At Bedtime 3)  Methadone Hcl 10 Mg Tabs (Methadone Hcl) .... Take 2 By Mouth Three Times A Day 4)  Oxycodone-Acetaminophen 5-325 Mg Tabs (Oxycodone-Acetaminophen) .... Take 1-2 Tablets By Mouth Every 4 Hours As Needed For Pain 5)  Alprazolam 0.25 Mg Tabs (Alprazolam) .... Take 1 Tablet By Mouth Three Times A Day As Needed For Anxiety and Panic Attacks 6)  Budesonide 0.25 Mg/64ml Susp (Budesonide) .... Use in Nebulizer Two Times A Day 7)  Albuterol Sulfate (2.5 Mg/28ml) 0.083% Nebu (Albuterol Sulfate) .... Use 1 Vial Via Nebulizer Every 6 Hours 8)  Ipratropium Bromide 0.02 % Soln (Ipratropium Bromide) .... Inhale 1 Vial in [employer] Every 6 Hours  Allergies (verified): 1)  ! Pcn  Past History:  Past Medical History: Last updated: 01-22-2010 Degenerative disk disease Laminectomy-removal of painful hardware-7/04, 7/0, Low Back Surgery-2000(disabled), Severe bullous emphysema/ COPD  supplemental O2 Avascular necrosis Osteoporosis Neurofibromatosis  Past Surgical History: Last updated: 2010/01/22 Chest CT-emphysema - 09/25/2002, Chest CT-hypoplastic r rib - 10/23/2002, dorsal column stimulator - 06/08/2005, Echo-nl - 03/10/2003, FEV 1  70% befor bronchodilator - 10/23/2002, FEV 1  76% after bronchodilator - 10/06/2002, FEV 1/FVC 0.75 - 10/23/2002, MRI -brain, sm. 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 ; estranged from sisters Doesn't drive Patient is a current smoker. 40 pack years  Risk  Factors: Smoking Status: current (02/01/2010) Packs/Day: 0.5 (02/01/2010)  Review of Systems      See HPI       The patient complains of shortness of breath with activity and chest pain.  The patient denies shortness of breath at rest, productive cough, non-productive cough, coughing up blood, irregular heartbeats, acid heartburn, indigestion, loss of appetite, weight change, abdominal pain, difficulty swallowing, sore throat, tooth/dental problems, headaches, nasal congestion/difficulty breathing through nose, and sneezing.    Vital Signs:  Patient profile:   60 year old female Height:      63 inches Weight:      109 pounds BMI:     19.38 O2 Sat:      83 % on 3 L/min Pulse rate:   94 / minute BP sitting:   120 / 80  (right arm) Cuff size:   regular  Vitals Entered By: Reynaldo Minium CMA (February 01, 2010 2:12 PM)  O2 Flow:  3 L/min CC: Follow up visit-unable to do PFT; Increased SOB; currently smoking 1/2 ppd.  Comments Pt was placed on continous O2 3L/M and rechecked: 94% 3L/M.Reynaldo Minium CMA  February 01, 2010 2:20 PM    Physical Exam  Additional Exam:  General: A/Ox3; pleasant and cooperative, NAD, supplemental oxygen.  94% on 3 L/M continuous. SKIN: generalized neurofibromas NODES: no lymphadenopathy HEENT: [place]/AT, EOM- WNL, Conjuctivae- clear, PERRLA, TM-WNL, Nose- clear, Throat- clear and wnl, edentulous, very dry mucosa NECK: Supple w/ fair ROM, JVD- none, normal carotid impulses w/o bruits Thyroid- normal to palpation CHEST: [place]uff[place], somewhat labored. Crackles and diminished breath sounds. Fair bilateral breath sounds. HEART: RRR, no m/g/r heard ABDOMEN: Soft and nl ZOX:WRUE, nl pulses, no edema. Mild clubbing on fingers  NEURO: Grossly intact to observation      Impression & Recommendations:  Problem # 1:  CHRONIC OBSTRUCTIVE PULMONARY DISEASE (ICD-496) Mostly emphysema. She couldn't tell much before from Spiriva and doesn't feel much now with her nebulizer. I don't  think additional meds would make her life better. We discussed oxygen use. Desaturates with exertion and should adjust O2 for that.  Problem # 2:  TOBACCO DEPENDENCE (ICD-305.1) Continues effort to stop smoking. We have discussed available support.  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 We will continue to watch over time.  Other Orders: Est. Patient Level III (45409)  Patient Instructions: 1)  Keep appointment in August for PFT and to see me. 2)  Please keep trying hard to stop smoking.  3)  Suggest when you are up and walking around, that you set your oxygen on 4. When sitting or inactive, then 3  is ok.

## 2010-09-06 NOTE — Medication Information (Signed)
Summary: [employer]   Imported By: Lehman Prom 01/06/2010 16:33:17  _____________________________________________________________________  External Attachment:    Type:   Image     Comment:   External Document

## 2010-09-06 NOTE — Miscellaneous (Signed)
Summary: Orders Update  Clinical Lists Changes  Problems: Added new problem of UNSPECIFIED PERIPHERAL VASCULAR DISEASE (ICD-443.9) Orders: Added new Test order of Arterial Duplex Lower Extremity (Arterial Duplex Low) - Signed 

## 2010-09-06 NOTE — Letter (Signed)
Summary: [facility]   Imported By: Lamona Curl CMA (AAMA) 05/05/2010 12:03:33  _____________________________________________________________________  External Attachment:    Type:   Image     Comment:   External Document

## 2010-09-06 NOTE — Assessment & Plan Note (Signed)
Summary: abnormal lfts acute hepatitis.Marland KitchenMarland KitchenMarland KitchenMarland Kitchenem    History of Present Illness Visit Type: consult  Primary GI MD: Lina Sar MD Primary Provider: Bennett Scrape, MD Requesting Provider: Bennett Scrape, MD Chief Complaint: abnormal liver function tests History of Present Illness:   This is a 60 year old white female with chronic elevation of liver function tests, previously evaluated in 2008. She has neurofibromatosis and oxygen-dependent COPD. She had a recent upper abdominal ultrasound in August 2011 which showed a common bile duct of 10.3 mm with no interval change since her last ultrasound in May 2008. She has stable intrahepatic duct dilatation. Her alkaline phosphatase has been elevated at 375 with an AST of 98 and ALT of 234. Her hepatitis serologies have been negative and her synthetic function is normal; specifically an albumin of 4.5 and INR of 0.8. Her platelet count is normal. A CT Scan of the abdomen for evaluation of the common bile duct abnormality in August 2008 showed a prominent main pancreatic duct and dilated common bile duct with intrahepatic radical dilatation. There was no mass in the pancreas. An ERCP in September 2008 confirmed a dilated common bile duct but no obstruction. There was no stone or tumor. She had a prior cholecystectomy in 2007. She never drank alcohol. There is no family history of liver disease.   GI Review of Systems      Denies abdominal pain, acid reflux, belching, bloating, chest pain, dysphagia with liquids, dysphagia with solids, heartburn, loss of appetite, nausea, vomiting, vomiting blood, weight loss, and  weight gain.      Reports liver problems.     Denies anal fissure, black tarry stools, change in bowel habit, constipation, diarrhea, diverticulosis, fecal incontinence, heme positive stool, hemorrhoids, irritable bowel syndrome, jaundice, light color stool, rectal bleeding, and  rectal pain.    Current Medications (verified): 1)   Diltiazem Hcl Er Beads 360 Mg Xr24h-Cap (Diltiazem Hcl Er Beads) .... Take 1 By Mouth Once Daily 2)  Nortriptyline Hcl 50 Mg Caps (Nortriptyline Hcl) .... Take 2 By Mouth At Bedtime 3)  Methadone Hcl 10 Mg Tabs (Methadone Hcl) .... Take 2 By Mouth Three Times A Day 4)  Oxycodone-Acetaminophen 5-325 Mg Tabs (Oxycodone-Acetaminophen) .... Take 1-2 Tablets By Mouth Every 4 Hours As Needed For Pain 5)  Alprazolam 0.25 Mg Tabs (Alprazolam) .... Take 1 Tablet By Mouth Three Times A Day As Needed For Anxiety and Panic Attacks 6)  Budesonide 0.25 Mg/46ml Susp (Budesonide) .... Use in Nebulizer Two Times A Day 7)  Albuterol Sulfate (2.5 Mg/31ml) 0.083% Nebu (Albuterol Sulfate) .... Use 1 Vial Via Nebulizer Every 6 Hours 8)  Ipratropium Bromide 0.02 % Soln (Ipratropium Bromide) .... Inhale 1 Vial in [employer] Every 6 Hours 9)  Oxygen 4 L W/ Exertion, 3l At Rest, Sleep 10)  Excedrin Extra Strength 250-250-65 Mg Tabs (Aspirin-Acetaminophen-Caffeine) .... 6-8 By Mouth Daily For Headaches  Allergies (verified): 1)  ! 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 Chest CT-emphysema - 09/25/2002, Chest CT-hypoplastic r  rib -  10/23/2002, dorsal column stimulator - 06/08/2005, Echo-nl - 03/10/2003, FEV 1  70% befor bronchodilator - 10/23/2002, FEV 1  76% after bronchodilator - 10/06/2002, FEV 1/FVC 0.75 - 10/23/2002, MRI -brain, sm. Neurofib leasion - 06/02/2003, TSH  1.030 - 09/15/2002  tubal ligation hysterectomy cholecystectomy multiple back surgeries Tonsilectomy Right hip- metal plate  Family History: Father died of Lung CA Mother died of emphysema at 30, had neurofibromatosis, no family                                                       history of Asthma Family History Liver Disease No FH of Colon Cancer:  Social History: Reviewed history from 12/31/2009 and no changes required. 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 ; estranged from sisters Doesn't drive Patient is a current smoker. 40 pack years  Review of Systems       The patient complains of back pain, depression-new, fatigue, headaches-new, and sleeping problems.  The patient denies allergy/sinus, anemia, anxiety-new, arthritis/joint pain, blood in urine, breast changes/lumps, change in vision, confusion, cough, coughing up blood, fainting, fever, hearing problems, heart murmur, heart rhythm changes, itching, menstrual pain, muscle pains/cramps, night sweats, nosebleeds, pregnancy symptoms, shortness of breath, skin rash, sore throat, swelling of feet/legs, swollen lymph glands, thirst - excessive , urination - excessive , urination changes/pain, urine leakage, vision changes, and voice change.         Pertinent positive and negative review of systems were noted in the above HPI. All other ROS was otherwise negative.   Vital Signs:  Patient profile:   60 year old female Height:      63 inches Weight:      115 pounds BMI:     20.44 BSA:     1.53 Pulse rate:   88 / minute Pulse rhythm:   regular BP sitting:   128 / 64  (left arm) Cuff size:   regular  Vitals Entered By: Ok Anis CMA (May 06, 2010 1:41 PM)  Physical Exam  General:  dyspneic chronically ill appearing without portable oxygen Eyes:  nonicteric Mouth:  edentulous Neck:  jugular veins prominent Lungs:  decreased breath sounds Heart:  rapid S1-S2 Abdomen:  protuberant abdomen with voluntary guarding which relaxes no tenderness. Liver edge at costal margin. Overall span 8-9 cm. No splenomegaly. No ascites Skin:  widespread lesions of neurofibromatosis on the time extremities and face,palmar erythema Psych:  Alert and cooperative. Normal mood and affect.   Impression & Recommendations:  Problem # 1:  LIVER FUNCTION TESTS, ABNORMAL, HX OF (ICD-V12.2) Patient has stable intrahepatic duct dilatation with mild dilatation of the common bile duct which is not obstructed as per ERCP in 2008. A recent upper abdominal ultrasound shows no progression of dilatation. Her abnormal liver function tests are stable and her liver function is satisfactory as demonstrated by a normal albumin and prothrombin time. The etiology of the abnormalities is not known. We will obtain an antimitochondrial antibody titer and several other blood tests to rule out hepatocellular disease. She would be at very high risk for a liver biopsy which would most likely not impact her management in the long run anyway. Her upper abdominal ultrasound will need to be followed every year or 2 to watch for progressive dilatation of the biliary  tree. 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 There is no evidence of right-sided heart failure which could impact her liver function tests.  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  Patient Instructions: 1)  antimitochondrial antibody, ANA titer, serum ferritin, alpha-1 antitrypsin, alpha-fetoprotein, abdominal ultrasound every 2 years 2)  Hold off on liver biopsy due to comorbidities. 3)  Recall colonoscopy November 2015. 4)  Copy sent to : Dr Seth Bake. [place] 5)  The medication list was reviewed and reconciled.  All changed / newly prescribed medications were explained.  A complete medication list was provided to the patient / caregiver.

## 2010-09-06 NOTE — Assessment & Plan Note (Signed)
Summary: SOB consult/kcw   CC:  Pulmonary Consult-SOB; Dr. Riley Kill.  History of Present Illness: 01-27-2010- 60 yoF smoker, former Scientist, product/process development, seen with husband at request of Dr Riley Kill for COPD. She was dx'd about 4 years ago, but continues to smoke. Prescribed continuous oxygen, but using it only at night. She started out by asking for "somethng for my nerves". Has smoked 40 years, now reduced from 2 PPD to 1/2 PPD. Was followed by a psychiatrist - quit, "didn't like him". Tried quitting cigs but failed patches and afraid to try Chantix due to her depression. Little cough, dry, with dyspnea on ADLs, sometimes wheeze. Little change from day to day. Breathing doesn't wake her. Doesn't try to exercise. Feels overdired- avoids Spiriva. Had chest tube for left pneumothorax after a fall on 10/21/09. Denies pneumonia. Had pneumovax more than once, flu vax each year. Limited by back pain- Dr Riley Kill, Pain Management.   Preventive Screening-Counseling & Management  Alcohol-Tobacco     Smoking Status: current     Smoking Cessation Counseling: yes     Packs/Day: 0.5  Current Medications (verified): 1)  Diltiazem Hcl Er Beads 360 Mg Xr24h-Cap (Diltiazem Hcl Er Beads) .... Take 1 By Mouth Once Daily 2)  Nortriptyline Hcl 50 Mg Caps (Nortriptyline Hcl) .... Take 2 By Mouth At Bedtime 3)  Methadone Hcl 10 Mg Tabs (Methadone Hcl) .... Take 2 By Mouth Three Times A Day 4)  Oxycodone-Acetaminophen 5-325 Mg Tabs (Oxycodone-Acetaminophen) .... Take 1-2 Tablets By Mouth Every 4 Hours As Needed For Pain 5)  Alprazolam 0.25 Mg Tabs (Alprazolam) .... Take 1 Tablet By Mouth Three Times A Day As Needed For Anxiety and Panic Attacks 6)  Budesonide 0.25 Mg/73ml Susp (Budesonide) .... Use in Nebulizer Two Times A Day 7)  Albuterol Sulfate (2.5 Mg/4ml) 0.083% Nebu (Albuterol Sulfate) .... Use 1 Vial Via Nebulizer Every 6 Hours 8)  Ipratropium Bromide 0.02 % Soln (Ipratropium Bromide) .... Inhale 1 Vial in [employer]  Every 6 Hours 9)  Prednisone 10 Mg Tabs (Prednisone) .... Take As Directed 10)  Omeprazole 20 Mg Cpdr (Omeprazole) .... Take 1 By Mouth Once Daily 11)  Promethazine Hcl 25 Mg Tabs (Promethazine Hcl) .... Take 1 Tablet By Mouth Every 6 Hours As Needed For Nausea  Allergies (verified): 1)  ! 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 ; estranged from sisters Doesn't drive Patient is a current smoker. 40 pack years  Risk Factors: Smoking Status: current (01-27-10) Packs/Day: 0.5 (Jan 27, 2010)  Past Medical History: Degenerative disk disease Laminectomy-removal of painful hardware-7/04, 7/0, Low Back Surgery-2000(disabled), Severe bullous emphysema/ COPD  supplemental O2 Avascular necrosis Osteoporosis Neurofibromatosis  Past Surgical History: Chest CT-emphysema - 09/25/2002, Chest CT-hypoplastic r rib - 10/23/2002, dorsal column stimulator - 06/08/2005, Echo-nl - 03/10/2003, FEV 1  70% befor bronchodilator - 10/23/2002, FEV 1  76% after bronchodilator - 10/06/2002, FEV 1/FVC 0.75 - 10/23/2002, MRI -brain, sm. 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 ; estranged from sisters Doesn't drive Patient is a current smoker. 40 pack  years Smoking Status:  current Packs/Day:  0.5  Review of Systems      See HPI       The patient complains of shortness of breath with activity, anxiety, and depression.  The patient denies shortness of breath at rest, productive cough, non-productive cough, coughing up blood, chest pain, irregular heartbeats, acid heartburn, indigestion, loss of appetite, weight change, abdominal pain, difficulty swallowing, sore throat, tooth/dental problems, headaches, nasal congestion/difficulty breathing through nose, sneezing, itching, ear ache, hand/feet swelling, joint stiffness or pain, rash, change in color of mucus, and fever.    Vital Signs:  Patient profile:   60 year old female Weight:      113 pounds O2 Sat:      81 % on 4 L/min Pulse rate:   85 / minute BP sitting:   112 / 78  (left arm) Cuff size:   regular  Vitals Entered By: Reynaldo Minium CMA (Dec 31, 2009 11:22 AM)  O2 Flow:  4 L/min CC: Pulmonary Consult-SOB; Dr. Riley Kill Comments Pt was on pulse O2 when entering exam room; place on continous Oxygen (our tank)rechecked 2 min post 86% 2l/m; increased to 3l/m and rechecked 92%. Pt lef ton 3l/m at continous flow until otherwise directed by CDY.Reynaldo Minium CMA  Dec 31, 2009 11:23 AM    Physical Exam  Additional Exam:  General: A/Ox3; pleasant and cooperative, NAD, supplemental oxygen. Arrived sat 81% on 4 L/m demand. 86% on 2 L continuus and 92% on 3 L/M continuous. SKIN: generalized neurofibromas NODES: no lymphadenopathy HEENT: [place]/AT, EOM- WNL, Conjuctivae- clear, PERRLA, TM-WNL, Nose- clear, Throat- clear and wnl, edentulous, very dry mucosa NECK: Supple w/ fair ROM, JVD- none, normal carotid impulses w/o bruits Thyroid- normal to palpation CHEST: Puffineg, somewhat labored. Crackles and diminished breath sounds. HEART: RRR, no m/g/r heard ABDOMEN: Soft and nl; nml bowel sounds; no organomegaly or masses noted ZOX:WRUE, nl pulses, no edema. Mild clubbing on fingers  NEURO:  Grossly intact to observation      Impression & Recommendations:  Problem # 1:  CHRONIC OBSTRUCTIVE PULMONARY DISEASE (ICD-496) Probably severe emphysema with little bronchospasm and litle reponse to meds Wil get PFT and find results of [facility] CXR- Reviewed from 12/01/09- Severe emphysema, improved interstitial and airspace disease Smoking cessation counselled. Discussed oxygen. She has chronic hypoxic repiratory failure and I explained she needs to wear oxygen continuously.  Problem # 2:  TOBACCO DEPENDENCE (ICD-305.1)  Needs ongoing help- discussed. Support measures and counseling reviewed. Husband told not to buy for her. her chronic pain and anxiety/ depression make this harder to control.  Problem # 3:  DEPRESSIVE DISORDER, NOS (ICD-311) She began interview by asking med for nerves. This is deferred to Dr Riley Kill.  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  Medications Added to Medication List This Visit: 1)  Diltiazem Hcl Er Beads 360 Mg Xr24h-cap (Diltiazem hcl er beads) .... Take 1 by mouth once daily 2)  Nortriptyline Hcl 50 Mg Caps (Nortriptyline hcl) .... Take 2 by mouth at bedtime 3)  Methadone Hcl 10 Mg Tabs (Methadone hcl) .... Take 2 by mouth three times a day 4)  Oxycodone-acetaminophen 5-325 Mg Tabs (Oxycodone-acetaminophen) .... Take 1-2 tablets by mouth every 4 hours as needed for pain 5)  Alprazolam 0.25 Mg Tabs (Alprazolam) .... Take 1 tablet by mouth three times a day as needed for anxiety and panic attacks 6)  Budesonide 0.25 Mg/70ml Susp (Budesonide) .... Use in nebulizer two times a day 7)  Albuterol Sulfate (2.5 Mg/78ml) 0.083% Nebu (Albuterol sulfate) .... Use 1 vial via nebulizer every 6 hours 8)  Ipratropium Bromide 0.02 % Soln (Ipratropium bromide) .... Inhale 1 vial in nebulzer every 6 hours 9)  Prednisone 10 Mg Tabs (Prednisone) .... Take as directed 10)   Omeprazole 20 Mg Cpdr (Omeprazole) .... Take 1 by mouth once daily 11)  Promethazine Hcl 25 Mg Tabs (Promethazine hcl) .... Take 1 tablet by mouth every 6 hours as needed for nausea  Other Orders: Consultation Level IV (45409)  Patient Instructions: 1)  Please schedule a follow-up appointment in 1 month. 2)  Schedule PFT 3)  Use home oxygen at 3-4 L/M 4)  Please try very hard to stop smoking- don't ask your husband to buy any more. 5)  Walking will help, even a little walking is better than none. 6)  We will get your CXR report from [facility], and check your medicine list. 7)  Copy sent to: Dr Riley Kill

## 2010-09-06 NOTE — Letter (Signed)
Summary: [facility]   Imported By: Lamona Curl CMA (AAMA) 05/05/2010 12:02:14  _____________________________________________________________________  External Attachment:    Type:   Image     Comment:   External Document

## 2010-09-06 NOTE — Progress Notes (Signed)
Summary: Oxygen needs  Phone Note Call from Patient Call back at Home Phone [phone]   Caller: Patient Call For: [name] Summary of Call: Pt states that [facility] is needing an order from Dr Maple Hudson stating that he increased her O2 needs and the settings.Please advise. Initial call taken by: Reynaldo Minium CMA,  April 05, 2010 12:06 PM  Follow-up for Phone Call        Order faxed to [facility] [phone]. [name]  April 05, 2010 12:44 PM     New/Updated Medications: * OXYGEN 4 L W/ EXERTION, 3L AT REST, SLEEP

## 2010-09-06 NOTE — Letter (Signed)
Summary: Medical laboratory scientific officer- Office Note  [facility]- Office Note   Imported By: Lamona Curl CMA (AAMA) 05/05/2010 11:56:52  _____________________________________________________________________  External Attachment:    Type:   Image     Comment:   External Document

## 2010-09-08 NOTE — Assessment & Plan Note (Signed)
Summary: LUNG NODULE/[facility]   Copy to:  Bennett Scrape, MD Primary Provider/Referring Provider:  Bennett Scrape, MD  CC:  Follow up visit-COPD and post hospital.  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 She was dx'd about 4 years ago, but continues to smoke. Prescribed continuous oxygen, but using it only at night. She started out by asking for "somethng for my nerves". Has smoked 40 years, now reduced from 2 PPD to 1/2 PPD. Was followed by a psychiatrist - quit, "didn't like him". Tried quitting cigs but failed patches and afraid to try Chantix due to her depression. Little cough, dry, with dyspnea on ADLs, sometimes wheeze. Little change from day to day. Breathing doesn't wake her. Doesn't try to exercise. Feels overdired- avoids Spiriva. Had chest tube for left pneumothorax after a fall on 10/21/09. Denies pneumonia. Had pneumovax more than once, flu vax each year. Limited by back pain- Dr Riley Kill, Pain Management.  February 01, 2010- COPD, neurofibromatosis, hx traumatic L PTX/ rib fx Scheduled today for PFT, but she had to defer due to residual pain left lateral ribs where fx'd and chest tube after falling in March, 2011.  Trying to stop smoking, cold [place],  but says nerves make it hard. Cough "little bit" clear mucus. Denies blood, fever.On arrival, O2 portable pulde regulated 82% sat, rose to 94% on continuous 3L.  Uses her nebulizer rarely. Has little change day to day and denies wheeze.  August 11, 2010-  COPD, neurofibromatosis, hx traumatic L PTX/ rib fx Nurse-CC: Follow up visit-COPD and post hospital Hosp twice- Dec 14 and Jan 3 for exacerbations of COPD. Can't afford meds. Both episodes c/w viral chest colds. Remais on O2- helps. Continues to smoke. CXRs showed severe bullous emphysema and new LUL nodule 1.4x1.0 cm. Feels back to her baseline, no phlegm. Right  rib cage still hurts where she broke rib in Spring 2010. Has been running O2 at 5 L since first hosp.    Preventive Screening-Counseling & Management  Alcohol-Tobacco     Smoking Status: current     Smoking Cessation Counseling: yes     Smoke Cessation Stage: contemplative     Packs/Day: 0.75     Year Started: teens     Tobacco Counseling: to quit use of tobacco products  Current Medications (verified): 1)  Methadone Hcl 10 Mg Tabs (Methadone Hcl) .... Take 2 By Mouth Three Times A Day 2)  Oxycodone-Acetaminophen 5-325 Mg Tabs (Oxycodone-Acetaminophen) .... Take 1-2 Tablets By Mouth Every 4 Hours As Needed For Pain 3)  Budesonide 0.5 Mg/68ml Susp (Budesonide) .Marland Kitchen.. 1 Vial Two Times A Day 4)  Albuterol Sulfate (2.5 Mg/84ml) 0.083% Nebu (Albuterol Sulfate) .... Use 1 Vial Via Nebulizer Every 6 Hours 5)  Ipratropium Bromide 0.02 % Soln (Ipratropium Bromide) .... Inhale 1 Vial in [employer] Every 6 Hours 6)  Oxygen 5 L 24/7 7)  Mucinex 600 Mg Xr12h-Tab (Guaifenesin) .... Take 1 By Mouth Two Times A Day 8)  Prednisone 10 Mg Tabs (Prednisone) .... Take 40 Mg Once Daily 9)  Citalopram Hydrobromide 10 Mg Tabs (Citalopram Hydrobromide) .... Take 1 By Mouth Once Daily  Allergies (verified): 1)  ! Pcn  Past History:  Past Surgical History: Last updated: 12/31/2009 Chest CT-emphysema - 09/25/2002, Chest CT-hypoplastic r rib - 10/23/2002, dorsal column stimulator - 06/08/2005, Echo-nl - 03/10/2003, FEV 1  70% befor bronchodilator -  10/23/2002, FEV 1  76% after bronchodilator - 10/06/2002, FEV 1/FVC 0.75 - 10/23/2002, MRI -brain, sm. 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 estranged from sisters Doesn't drive Patient is a current smoker. 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 estranged from sisters Doesn't drive Patient is a current smoker. 40 pack years Packs/Day:  0.75  Review of Systems      See HPI       The patient complains of shortness of breath with activity and chest pain.  The patient denies shortness of breath at rest, productive cough, non-productive cough, coughing up blood, irregular heartbeats, acid heartburn, indigestion, loss of appetite, weight change, abdominal pain, difficulty swallowing, sore throat, tooth/dental problems, headaches, nasal congestion/difficulty breathing through nose, and sneezing.    Vital Signs:  Patient profile:   60 year old female Height:      63 inches Weight:      114.50 pounds BMI:     20.36 O2 Sat:      99 % on 5 L/min Pulse rate:   110 / minute BP sitting:   112 / 68  (left arm) Cuff size:   regular  Vitals Entered By: Reynaldo Minium CMA (August 11, 2010 1:45 PM)  O2 Flow:  5 L/min CC: Follow up visit-COPD and post hospital   Physical Exam  Additional Exam:  General: A/Ox3; pleasant and cooperative, NAD, supplemental oxygen.  99% on 5 L/M continuous. SKIN: generalized neurofibromas NODES: no lymphadenopathy HEENT: [place]/AT, EOM- WNL, Conjuctivae- clear, PERRLA, TM-WNL, Nose- clear, Throat- clear and wnl, edentulous, very dry mucosa NECK: Supple w/ fair ROM, JVD- none, normal carotid impulses w/o bruits Thyroid- normal to palpation CHEST: Puffineg, somewhat labored. Crackles and diminished breath sounds. Fair bilateral breath sounds. HEART: RRR, no m/g/r heard ABDOMEN: Soft and nl WJX:BJYN, nl pulses, no edema. Mild clubbing on fingers  NEURO: Grossly intact to observation      Impression & Recommendations:  Problem # 1:  CHRONIC OBSTRUCTIVE PULMONARY DISEASE (ICD-496) Recent exacerbation due to viral pattern bronchitis. She is back to baseline and oxygenating better Will let her redu ce O2 back toward 3 L/M as tolerated.   Problem # 2:  TOBACCO DEPENDENCE (ICD-305.1)  Really important that she stop- doscussed.   Orders: Est. Patient Level IV (82956)  Problem # 3:  LUNG NODULE (ICD-518.89)  We discussed options and will bring her back in 3 months for f/u CXR. In this smoker ddx includes benign, neoplasm, neurofibroma.  Problem # 4:  LUNG NODULE (ICD-518.89)  Medications Added to Medication List This Visit: 1)  Budesonide 0.5 Mg/60ml Susp (Budesonide) .Marland Kitchen.. 1 vial two times a day 2)  Oxygen 5 L 24/7  3)  Mucinex 600 Mg Xr12h-tab (Guaifenesin) .... Take 1 by mouth two times a day 4)  Prednisone 10 Mg Tabs (Prednisone) .... Take 40 mg once daily 5)  Citalopram Hydrobromide 10 Mg Tabs (Citalopram hydrobromide) .... Take 1 by mouth once daily  Patient Instructions: 1)  Please schedule a follow-up appointment in 3 months.  We can update CXR then. 2)  Continue using your nebulizer as needed. Please call if you need [place].  3)   Please keep trying to get off those cigarettes.

## 2010-09-08 NOTE — Discharge Summary (Signed)
Summary: COPD Exacerbation    NAME:  [name], [name]               ACCOUNT NO.:  [id number]      MEDICAL RECORD NO.:  [id number]          PATIENT TYPE:  REC      LOCATION:  [facility]                          FACILITY:  [facility]      PHYSICIAN:  Jeoffrey Massed, MD    DATE OF BIRTH:  11-Sep-1950      DATE OF ADMISSION:  08/09/2010   DATE OF DISCHARGE:  08/10/2010                            DISCHARGE SUMMARY - REFERRING         PRIMARY CARE PRACTITIONER:  Dr. Providence Crosby.      PATIENT'S PRIMARY PULMONOLOGIST:  Dr. Jetty Duhamel.      PRIMARY DISCHARGE DIAGNOSES:   1. Chronic obstructive pulmonary exacerbation.   2. Acute on chronic hypoxic respiratory failure.      PATIENT'S SECONDARY DISCHARGE DIAGNOSES:   1. Chronic pain syndrome.   2. Ongoing tobacco abuse.   3. History of pulmonary nodule.   4. History of neurofibromatosis.   5. She does carry a history of hypertension.  However, she is not on       any medications presently.   6. History of traumatic pneumothorax secondary to fall with chest tube       placement.   7. Prior history of syncope.      DISCHARGE MEDICATIONS:   1. Albuterol nebulizers 2.5 mg inhaled q.6 hours and q.2 hours p.r.n.   2. Budesonide 0.5 mg inhaled every 12 hours.   3. Mucinex 600 mg 1 tablet p.o. twice daily.   4. Atrovent 0.5 mg inhaled every 6 hours via nebulizers.   5. Avelox 400 mg 1 tablet p.o. daily for 5 more days.   6. Prednisone 40 mg 1 tablet p.o. daily from January 5 to August 13, 2010.   7. Prednisone 30 mg 1 tablet p.o. daily from January 8 to August 16, 2010.   8. Prednisone 20 mg 1 tablet p.o. daily from January 11 to August 19, 2010.   9. Prednisone 10 mg p.o. daily from January 14 to August 22, 2010,       and then discontinue.   10.Citalopram 10 mg 1 tablet p.o. daily.   11.Hydrocortisone 1% cream, 1 application topically daily p.r.n.   12.Methadone 10 mg 2 tablets p.o. 3 times a day.   13.Percocet 5/325 one  tablet p.o. q.6 hours p.r.n.      CONSULTATIONS:  None.      BRIEF HPI:  Patient is a 60 year old female with the above noted medical   problems who was brought to the hospital on August 08, 2009 for   shortness of breath.  She was also found to be tachycardic during her   initial evaluation.  She was then admitted to the Hospitalist Service   for further evaluation and treatment.  For further details, please see   the history and physical that was dictated by Dr. Toniann Fail on   admission.      PERTINENT RADIOLOGICAL STUDIES:  1. CT of the head without contrast showed no significant acute       intracranial abnormality.   2. X-ray of the chest showed chronic interstitial lung disease with       severe emphysema.  Left upper lobe pulmonary nodule.  Thoracic       scoliosis.      PERTINENT LABORATORY DATA:   1. Cardiac enzymes were cycled, and these are negative.   2. TSH is 0.691.   3. Urine drug screen was positive for benzodiazepine and opiates.      BRIEF HOSPITAL COURSE:   1. Shortness of breath.  This was likely secondary to COPD       exacerbation.  This patient does have a history of chronic       respiratory failure secondary to COPD, and is on home O2 at all       times.  She unfortunately continues to use tobacco.  In any event       patient was admitted, started on steroids and nebulized       bronchodilators.  She was also empirically covered with       antibiotics.  Over the past 2 days she has done significantly       better, and is actually requesting to be discharged home today.  On       examination she hardly has any rhonchi or wheezing, and is not       short of breath at rest.  She is also easily able to talk in full       sentences, and is not using any of her accessory muscles of       respiration.  She will be discharged on a prednisone taper as noted       above and also on Avelox for 5 more days.  She is requesting that       nebulizers that she stopped  taking be resumed on discharge.  We       will have our case management worker arrange for nebulizers as       above.  She will follow up with her primary pulmonologist and the       primary care practitioner in the next 1-2 weeks for further       evaluation and ongoing care.   2. Patient did have atypical chest pain on admission.  Her cardiac       enzymes were cycled, and these are negative.   3. Chronic pain syndrome.  She is to continue taking her usual       narcotic medication.  She is to continue following up with her pain       doctor as previously scheduled.   4. Patient did have an episode of questionable fecal incontinence, and       as a result an EEG was done by the admitting physician which is       currently pending, and will need to be followed up as an       outpatient.   5. History of pulmonary nodule.  This was also seen on a CAT scan of       the chest done on July 25, 2010.  Given her history of smoking,       she will need further outpatient workup.      DISPOSITION:  Patient will be discharged home.      FOLLOW-UP INSTRUCTIONS:   1. Patient will follow up with her  primary care practitioner, Dr.       Providence Crosby, within 1-2 weeks upon discharge.   2. She is to follow up with her primary pulmonologist within 1-2 weeks       upon discharge.  She is to call and make an appointment.   3. Patient does have a pulmonary nodule noted on a CAT scan of herchest on July 25, 2010 for which she will require further       workup.  In fact, patient has a previously scheduled appointment at       her primary       pulmonologist, Dr. Roxy Cedar office, on August 11, 2010 at 1:30 p.m.       She is advised to keep this appointment.  Given a history of       smoking, the lung nodule is obviously a cause of concern.  All of       this was explained to the patient in detail, and she claims       understanding.  Total time spent equals 45 minutes.               Jeoffrey Massed, MD               SG/MEDQ  D:  08/10/2010  T:  08/10/2010  Job:  [id number]      cc:   Bennett Scrape, MD   Ranelle Oyster, M.D.   Jetty Duhamel, M.D.      Electronically Signed by Jeoffrey Massed  on 08/12/2010 05:27:22 PM

## 2010-09-08 NOTE — Miscellaneous (Signed)
Summary: Request for PFT records/[facility]  Request for PFT records/[facility]   Imported By: Sherian Rein 08/29/2010 10:20:57  _____________________________________________________________________  External Attachment:    Type:   Image     Comment:   External Document

## 2010-09-08 NOTE — Progress Notes (Signed)
Summary: dr called- re_ hosp visit- dr young  Phone Note From Other Clinic   Caller: dr Olena Leatherwood at [facility] For: young Summary of Call: calling as a courtesty for pt who requests that dr young see pt while she is in hosp today- [facility] room 4704. dr Olena Leatherwood (if needed) [phone]. pt is in for copd exacerbation Initial call taken by: Tivis Ringer, CNA,  August 09, 2010 12:17 PM  Follow-up for Phone Call        Noted courtesy call. Follow-up by: Waymon Budge MD,  August 09, 2010 1:28 PM

## 2010-09-14 NOTE — Progress Notes (Signed)
Summary: Triage  Medications Added PRILOSEC 20 MG CPDR (OMEPRAZOLE) Take one by mouth two times a day x 2 weeks then prn       Phone Note Call from Patient Call back at [phone]   Caller: Patient Call For: Dr. Juanda Chance Reason for Call: Talk to Nurse Summary of Call: abd pain, heartburn since Monday Initial call taken by: Karna Christmas,  September 02, 2010 3:02 PM  Follow-up for Phone Call        Patient calling to report abdominal pain and heartburn since Monday. Patient states the abdominal pain is above her belly button under her bra area. States her heartburn is so bad she cannot sleep. Some nausea this AM but no vomiting. States she is not eating much because it hurts. States she is drinking some fluids. Patient states she is not taking any medications for reflux. Please, advise. Follow-up by: Jesse Fall RN,  September 02, 2010 3:21 PM  Additional Follow-up for Phone Call Additional follow up Details #1::        Prilosec 20 mg by mouth two times a day x 2 weeks,then  as needed. Additional Follow-up by: Hart Carwin MD,  September 04, 2010 7:47 AM    Additional Follow-up for Phone Call Additional follow up Details #2::    Patient notified of Dr. Regino Schultze recommendations. Rx to pharmacy Follow-up by: Jesse Fall RN,  September 05, 2010 8:25 AM  New/Updated Medications: PRILOSEC 20 MG CPDR (OMEPRAZOLE) Take one by mouth two times a day x 2 weeks then prn Prescriptions: PRILOSEC 20 MG CPDR (OMEPRAZOLE) Take one by mouth two times a day x 2 weeks then prn  #30 x 0   Entered by:   Jesse Fall RN   Authorized by:   Hart Carwin MD   Signed by:   Jesse Fall RN on 09/05/2010   Method used:   Electronically to        [facility] [address] [facility] [address] [phone]* (retail)       [address]       [place], [place]  [address]       Ph: [phone] or [phone]       Fax: [phone]   RxID:   [id number]

## 2010-09-23 ENCOUNTER — Inpatient Hospital Stay (INDEPENDENT_AMBULATORY_CARE_PROVIDER_SITE_OTHER)
Admission: RE | Admit: 2010-09-23 | Discharge: 2010-09-23 | Disposition: A | Discharge status: Home / Self Care | Payer: Medicare Other | Source: Ambulatory Visit | Attending: Family Medicine | Admitting: Family Medicine

## 2010-09-23 DIAGNOSIS — I83009 Varicose veins of unspecified lower extremity with ulcer of unspecified site: Secondary | ICD-10-CM

## 2010-09-23 DIAGNOSIS — L97509 Non-pressure chronic ulcer of other part of unspecified foot with unspecified severity: Secondary | ICD-10-CM

## 2010-10-04 ENCOUNTER — Encounter (HOSPITAL_BASED_OUTPATIENT_CLINIC_OR_DEPARTMENT_OTHER): Payer: Medicare Other | Attending: Plastic Surgery

## 2010-10-04 DIAGNOSIS — Z79899 Other long term (current) drug therapy: Secondary | ICD-10-CM | POA: Insufficient documentation

## 2010-10-04 DIAGNOSIS — J438 Other emphysema: Secondary | ICD-10-CM | POA: Insufficient documentation

## 2010-10-04 DIAGNOSIS — L89609 Pressure ulcer of unspecified heel, unspecified stage: Secondary | ICD-10-CM | POA: Insufficient documentation

## 2010-10-04 DIAGNOSIS — L8990 Pressure ulcer of unspecified site, unspecified stage: Secondary | ICD-10-CM | POA: Insufficient documentation

## 2010-10-04 DIAGNOSIS — F17200 Nicotine dependence, unspecified, uncomplicated: Secondary | ICD-10-CM | POA: Insufficient documentation

## 2010-10-04 DIAGNOSIS — M549 Dorsalgia, unspecified: Secondary | ICD-10-CM | POA: Insufficient documentation

## 2010-10-04 DIAGNOSIS — Z90710 Acquired absence of both cervix and uterus: Secondary | ICD-10-CM | POA: Insufficient documentation

## 2010-10-04 DIAGNOSIS — Q8501 Neurofibromatosis, type 1: Secondary | ICD-10-CM | POA: Insufficient documentation

## 2010-10-04 DIAGNOSIS — Z9981 Dependence on supplemental oxygen: Secondary | ICD-10-CM | POA: Insufficient documentation

## 2010-10-05 ENCOUNTER — Encounter (INDEPENDENT_AMBULATORY_CARE_PROVIDER_SITE_OTHER): Payer: Medicare Other

## 2010-10-05 DIAGNOSIS — R9430 Abnormal result of cardiovascular function study, unspecified: Secondary | ICD-10-CM

## 2010-10-05 DIAGNOSIS — L97409 Non-pressure chronic ulcer of unspecified heel and midfoot with unspecified severity: Secondary | ICD-10-CM

## 2010-10-06 ENCOUNTER — Other Ambulatory Visit: Payer: Self-pay | Admitting: Plastic Surgery

## 2010-10-06 ENCOUNTER — Ambulatory Visit (HOSPITAL_COMMUNITY)
Admission: RE | Admit: 2010-10-06 | Discharge: 2010-10-06 | Disposition: A | Discharge status: Home / Self Care | Payer: Medicare Other | Source: Ambulatory Visit | Attending: Plastic Surgery | Admitting: Plastic Surgery

## 2010-10-06 ENCOUNTER — Ambulatory Visit: Payer: Medicare Other

## 2010-10-06 ENCOUNTER — Encounter: Payer: Medicare Other | Attending: Physical Medicine & Rehabilitation

## 2010-10-06 DIAGNOSIS — J449 Chronic obstructive pulmonary disease, unspecified: Secondary | ICD-10-CM

## 2010-10-06 DIAGNOSIS — M8708 Idiopathic aseptic necrosis of bone, other site: Secondary | ICD-10-CM | POA: Insufficient documentation

## 2010-10-06 DIAGNOSIS — M810 Age-related osteoporosis without current pathological fracture: Secondary | ICD-10-CM | POA: Insufficient documentation

## 2010-10-06 DIAGNOSIS — M79609 Pain in unspecified limb: Secondary | ICD-10-CM | POA: Insufficient documentation

## 2010-10-06 DIAGNOSIS — Z9981 Dependence on supplemental oxygen: Secondary | ICD-10-CM | POA: Insufficient documentation

## 2010-10-06 DIAGNOSIS — L02619 Cutaneous abscess of unspecified foot: Secondary | ICD-10-CM | POA: Insufficient documentation

## 2010-10-06 DIAGNOSIS — M79673 Pain in unspecified foot: Secondary | ICD-10-CM

## 2010-10-06 DIAGNOSIS — G8929 Other chronic pain: Secondary | ICD-10-CM | POA: Insufficient documentation

## 2010-10-06 DIAGNOSIS — J4489 Other specified chronic obstructive pulmonary disease: Secondary | ICD-10-CM | POA: Insufficient documentation

## 2010-10-06 DIAGNOSIS — M899 Disorder of bone, unspecified: Secondary | ICD-10-CM | POA: Insufficient documentation

## 2010-10-06 DIAGNOSIS — M961 Postlaminectomy syndrome, not elsewhere classified: Secondary | ICD-10-CM

## 2010-10-06 DIAGNOSIS — M1610 Unilateral primary osteoarthritis, unspecified hip: Secondary | ICD-10-CM

## 2010-10-06 DIAGNOSIS — IMO0002 Reserved for concepts with insufficient information to code with codable children: Secondary | ICD-10-CM

## 2010-10-06 NOTE — Procedures (Unsigned)
LOWER EXTREMITY ARTERIAL DUPLEX  INDICATION:  Nonhealing left heel ulcer.  HISTORY: Diabetes:  no Cardiac:  no Hypertension:  no Smoking:  yes Previous Surgery:  no  SINGLE LEVEL ARTERIAL EXAM                         RIGHT                LEFT Brachial:               132                  129 Anterior tibial:        125                  104 Posterior tibial:       126                  107 Peroneal: Ankle/Brachial Index:   0.95                 0.81  LOWER EXTREMITY ARTERIAL DUPLEX EXAM  DUPLEX:  Patent left common femoral, proximal profunda femoral, proximal mid and superficial femoral and popliteal arteries with no increased velocities noted.  No flow was adequately visualized in the distal superficial femoral artery based on limited visualization due to vessel depth with collateral circulation noted.  IMPRESSION: 1. Possible occlusion of the left distal superficial femoral artery     based on limited visualization as described above. 2. The right ankle brachial index and triphasic Doppler waveforms     suggest normal perfusion of the right lower extremity.  The left     ankle brachial index and biphasic/ monophasic Doppler waveforms     suggest mildly decreased  perfusion to the left lower extremity.  ___________________________________________ Quita Skye Hart Rochester, M.D.  CH/MEDQ  D:  10/06/2010  T:  10/06/2010  Job:  [id number]

## 2010-10-11 ENCOUNTER — Encounter (HOSPITAL_BASED_OUTPATIENT_CLINIC_OR_DEPARTMENT_OTHER): Payer: Medicare Other | Attending: Plastic Surgery

## 2010-10-11 DIAGNOSIS — Z79899 Other long term (current) drug therapy: Secondary | ICD-10-CM | POA: Insufficient documentation

## 2010-10-11 DIAGNOSIS — Q8501 Neurofibromatosis, type 1: Secondary | ICD-10-CM | POA: Insufficient documentation

## 2010-10-11 DIAGNOSIS — M549 Dorsalgia, unspecified: Secondary | ICD-10-CM | POA: Insufficient documentation

## 2010-10-11 DIAGNOSIS — Z90710 Acquired absence of both cervix and uterus: Secondary | ICD-10-CM | POA: Insufficient documentation

## 2010-10-11 DIAGNOSIS — Z9981 Dependence on supplemental oxygen: Secondary | ICD-10-CM | POA: Insufficient documentation

## 2010-10-11 DIAGNOSIS — F17200 Nicotine dependence, unspecified, uncomplicated: Secondary | ICD-10-CM | POA: Insufficient documentation

## 2010-10-11 DIAGNOSIS — J438 Other emphysema: Secondary | ICD-10-CM | POA: Insufficient documentation

## 2010-10-11 DIAGNOSIS — L8990 Pressure ulcer of unspecified site, unspecified stage: Secondary | ICD-10-CM | POA: Insufficient documentation

## 2010-10-11 DIAGNOSIS — L89609 Pressure ulcer of unspecified heel, unspecified stage: Secondary | ICD-10-CM | POA: Insufficient documentation

## 2010-10-17 LAB — CBC
HCT: 44.1 % (ref 36.0–46.0)
HCT: 45.2 % (ref 36.0–46.0)
HCT: 45.2 % (ref 36.0–46.0)
HCT: 45.9 % (ref 36.0–46.0)
HCT: 46.6 % — ABNORMAL HIGH (ref 36.0–46.0)
Hemoglobin: 15 g/dL (ref 12.0–15.0)
Hemoglobin: 15.2 g/dL — ABNORMAL HIGH (ref 12.0–15.0)
Hemoglobin: 15.5 g/dL — ABNORMAL HIGH (ref 12.0–15.0)
Hemoglobin: 16.2 g/dL — ABNORMAL HIGH (ref 12.0–15.0)
MCH: 29.6 pg (ref 26.0–34.0)
MCH: 30 pg (ref 26.0–34.0)
MCH: 30.6 pg (ref 26.0–34.0)
MCHC: 32.9 g/dL (ref 30.0–36.0)
MCHC: 33.1 g/dL (ref 30.0–36.0)
MCV: 88.3 fL (ref 78.0–100.0)
MCV: 88.5 fL (ref 78.0–100.0)
MCV: 89.5 fL (ref 78.0–100.0)
MCV: 89.6 fL (ref 78.0–100.0)
MCV: 89.8 fL (ref 78.0–100.0)
MCV: 90.1 fL (ref 78.0–100.0)
Platelets: 225 10*3/uL (ref 150–400)
Platelets: 230 10*3/uL (ref 150–400)
RBC: 5.17 MIL/uL — ABNORMAL HIGH (ref 3.87–5.11)
RBC: 5.3 MIL/uL — ABNORMAL HIGH (ref 3.87–5.11)
RDW: 18.5 % — ABNORMAL HIGH (ref 11.5–15.5)
RDW: 18.6 % — ABNORMAL HIGH (ref 11.5–15.5)
RDW: 18.7 % — ABNORMAL HIGH (ref 11.5–15.5)
RDW: 19 % — ABNORMAL HIGH (ref 11.5–15.5)
WBC: 10.6 10*3/uL — ABNORMAL HIGH (ref 4.0–10.5)
WBC: 14.6 10*3/uL — ABNORMAL HIGH (ref 4.0–10.5)
WBC: 8.8 10*3/uL (ref 4.0–10.5)
WBC: 9.5 10*3/uL (ref 4.0–10.5)

## 2010-10-17 LAB — TROPONIN I
Troponin I: 0.03 ng/mL (ref 0.00–0.06)
Troponin I: 0.03 ng/mL (ref 0.00–0.06)
Troponin I: 0.04 ng/mL (ref 0.00–0.06)

## 2010-10-17 LAB — BASIC METABOLIC PANEL
BUN: 11 mg/dL (ref 6–23)
BUN: 14 mg/dL (ref 6–23)
CO2: 24 mEq/L (ref 19–32)
CO2: 30 mEq/L (ref 19–32)
Calcium: 8.5 mg/dL (ref 8.4–10.5)
Chloride: 106 mEq/L (ref 96–112)
Chloride: 96 mEq/L (ref 96–112)
Creatinine, Ser: 0.69 mg/dL (ref 0.4–1.2)
GFR calc Af Amer: >60 mL/min (ref >60)
GFR calc Af Amer: >60 mL/min (ref >60)
GFR calc non Af Amer: >60 mL/min (ref >60)
GFR calc non Af Amer: >60 mL/min (ref >60)
Glucose, Bld: 102 mg/dL — ABNORMAL HIGH (ref 70–99)
Glucose, Bld: 134 mg/dL — ABNORMAL HIGH (ref 70–99)
Potassium: 3.9 mEq/L (ref 3.5–5.1)
Potassium: 4.9 mEq/L (ref 3.5–5.1)
Sodium: 134 mEq/L — ABNORMAL LOW (ref 135–145)
Sodium: 136 mEq/L (ref 135–145)
Sodium: 137 mEq/L (ref 135–145)
Sodium: 138 mEq/L (ref 135–145)

## 2010-10-17 LAB — RAPID URINE DRUG SCREEN, HOSP PERFORMED
Amphetamines: NOT DETECTED
Barbiturates: NOT DETECTED
Benzodiazepines: POSITIVE — AB
Cocaine: NOT DETECTED
Opiates: NOT DETECTED
Opiates: POSITIVE — AB
Tetrahydrocannabinol: NOT DETECTED

## 2010-10-17 LAB — DIFFERENTIAL
Eosinophils Absolute: 0.1 10*3/uL (ref 0.0–0.7)
Eosinophils Absolute: 0.1 10*3/uL (ref 0.0–0.7)
Eosinophils Relative: 1 % (ref 0–5)
Lymphocytes Relative: 9 % — ABNORMAL LOW (ref 12–46)
Lymphs Abs: 1 10*3/uL (ref 0.7–4.0)
Lymphs Abs: 1 10*3/uL (ref 0.7–4.0)
Monocytes Absolute: 0.8 10*3/uL (ref 0.1–1.0)
Monocytes Relative: 10 % (ref 3–12)
Monocytes Relative: 5 % (ref 3–12)
Neutrophils Relative %: 78 % — ABNORMAL HIGH (ref 43–77)
Neutrophils Relative %: 85 % — ABNORMAL HIGH (ref 43–77)

## 2010-10-17 LAB — GLUCOSE, CAPILLARY
Glucose-Capillary: 145 mg/dL — ABNORMAL HIGH (ref 70–99)
Glucose-Capillary: 153 mg/dL — ABNORMAL HIGH (ref 70–99)
Glucose-Capillary: 180 mg/dL — ABNORMAL HIGH (ref 70–99)
Glucose-Capillary: 193 mg/dL — ABNORMAL HIGH (ref 70–99)

## 2010-10-17 LAB — BLOOD GAS, ARTERIAL
Acid-base deficit: 0.4 mmol/L (ref 0.0–2.0)
Bicarbonate: 23.3 mEq/L (ref 20.0–24.0)
Bicarbonate: 26.2 mEq/L — ABNORMAL HIGH (ref 20.0–24.0)
Drawn by: 32526
O2 Saturation: 85 %
PEEP: 8 cmH2O
Patient temperature: 98.6
Pressure support: 14 cmH2O
TCO2: 24.8 mmol/L (ref 0–100)
TCO2: 27.5 mmol/L (ref 0–100)
pCO2 arterial: 50.8 mmHg — ABNORMAL HIGH (ref 35.0–45.0)
pCO2 arterial: 51 mmHg — ABNORMAL HIGH (ref 35.0–45.0)
pH, Arterial: 7.281 — ABNORMAL LOW (ref 7.350–7.400)
pO2, Arterial: 49.8 mmHg — ABNORMAL LOW (ref 80.0–100.0)
pO2, Arterial: 87.3 mmHg (ref 80.0–100.0)

## 2010-10-17 LAB — COMPREHENSIVE METABOLIC PANEL
ALT: 10 U/L (ref 0–35)
ALT: 25 U/L (ref 0–35)
AST: 13 U/L (ref 0–37)
Albumin: 2.9 g/dL — ABNORMAL LOW (ref 3.5–5.2)
Albumin: 3.3 g/dL — ABNORMAL LOW (ref 3.5–5.2)
Alkaline Phosphatase: 133 U/L — ABNORMAL HIGH (ref 39–117)
Alkaline Phosphatase: 95 U/L (ref 39–117)
BUN: 14 mg/dL (ref 6–23)
BUN: 5 mg/dL — ABNORMAL LOW (ref 6–23)
CO2: 22 mEq/L (ref 19–32)
CO2: 32 mEq/L (ref 19–32)
Calcium: 8.7 mg/dL (ref 8.4–10.5)
Calcium: 8.7 mg/dL (ref 8.4–10.5)
Chloride: 109 mEq/L (ref 96–112)
Creatinine, Ser: 0.76 mg/dL (ref 0.4–1.2)
GFR calc Af Amer: >60 mL/min (ref >60)
GFR calc non Af Amer: >60 mL/min (ref >60)
GFR calc non Af Amer: >60 mL/min (ref >60)
Glucose, Bld: 145 mg/dL — ABNORMAL HIGH (ref 70–99)
Glucose, Bld: 81 mg/dL (ref 70–99)
Potassium: 3.2 mEq/L — ABNORMAL LOW (ref 3.5–5.1)
Potassium: 4.1 mEq/L (ref 3.5–5.1)
Sodium: 137 mEq/L (ref 135–145)
Total Bilirubin: 0.1 mg/dL — ABNORMAL LOW (ref 0.3–1.2)
Total Protein: 5.9 g/dL — ABNORMAL LOW (ref 6.0–8.3)
Total Protein: 6 g/dL (ref 6.0–8.3)

## 2010-10-17 LAB — TSH: TSH: 0.691 u[IU]/mL (ref 0.350–4.500)

## 2010-10-17 LAB — POCT I-STAT 3, ART BLOOD GAS (G3+)
Bicarbonate: 20.6 mEq/L (ref 20.0–24.0)
O2 Saturation: 91 %
TCO2: 22 mmol/L (ref 0–100)
TCO2: 22 mmol/L (ref 0–100)
pCO2 arterial: 37.1 mmHg (ref 35.0–45.0)
pCO2 arterial: 38.9 mmHg (ref 35.0–45.0)
pH, Arterial: 7.345 — ABNORMAL LOW (ref 7.350–7.400)
pH, Arterial: 7.353 (ref 7.350–7.400)
pO2, Arterial: 44 mmHg — ABNORMAL LOW (ref 80.0–100.0)

## 2010-10-17 LAB — CK TOTAL AND CKMB (NOT AT ARMC)
CK, MB: 1.5 ng/mL (ref 0.3–4.0)
Relative Index: INVALID (ref 0.0–2.5)
Relative Index: INVALID (ref 0.0–2.5)
Relative Index: INVALID (ref 0.0–2.5)
Total CK: 21 U/L (ref 7–177)
Total CK: 25 U/L (ref 7–177)
Total CK: 54 U/L (ref 7–177)

## 2010-10-17 LAB — CARDIAC PANEL(CRET KIN+CKTOT+MB+TROPI)
CK, MB: 2.8 ng/mL (ref 0.3–4.0)
Relative Index: INVALID (ref 0.0–2.5)
Relative Index: INVALID (ref 0.0–2.5)
Total CK: 25 U/L (ref 7–177)
Total CK: 33 U/L (ref 7–177)
Total CK: 36 U/L (ref 7–177)
Troponin I: 0.02 ng/mL (ref 0.00–0.06)
Troponin I: 0.03 ng/mL (ref 0.00–0.06)

## 2010-10-17 LAB — MRSA PCR SCREENING: MRSA by PCR: NEGATIVE

## 2010-10-17 LAB — URINALYSIS, ROUTINE W REFLEX MICROSCOPIC
Glucose, UA: NEGATIVE mg/dL
Hgb urine dipstick: NEGATIVE
Specific Gravity, Urine: 1.038 — ABNORMAL HIGH (ref 1.005–1.030)

## 2010-10-17 LAB — PROTIME-INR: Prothrombin Time: 12.5 seconds (ref 11.6–15.2)

## 2010-10-17 LAB — LACTIC ACID, PLASMA
Lactic Acid, Venous: 1.1 mmol/L (ref 0.5–2.2)
Lactic Acid, Venous: 1.8 mmol/L (ref 0.5–2.2)

## 2010-10-17 LAB — POCT I-STAT, CHEM 8
BUN: 18 mg/dL (ref 6–23)
Hemoglobin: 16 g/dL — ABNORMAL HIGH (ref 12.0–15.0)
Potassium: 3.3 mEq/L — ABNORMAL LOW (ref 3.5–5.1)
Sodium: 141 mEq/L (ref 135–145)
TCO2: 23 mmol/L (ref 0–100)

## 2010-10-26 LAB — COMPREHENSIVE METABOLIC PANEL
AST: 16 U/L (ref 0–37)
Albumin: 3.3 g/dL — ABNORMAL LOW (ref 3.5–5.2)
Calcium: 8.8 mg/dL (ref 8.4–10.5)
Creatinine, Ser: 0.99 mg/dL (ref 0.4–1.2)
GFR calc Af Amer: >60 mL/min (ref >60)

## 2010-10-26 LAB — CBC
MCHC: 34.3 g/dL (ref 30.0–36.0)
MCV: 96.8 fL (ref 78.0–100.0)
Platelets: 315 10*3/uL (ref 150–400)
WBC: 7.9 10*3/uL (ref 4.0–10.5)

## 2010-10-26 LAB — APTT: aPTT: 29 seconds (ref 24–37)

## 2010-10-28 LAB — CARDIAC PANEL(CRET KIN+CKTOT+MB+TROPI)
CK, MB: 3.3 ng/mL (ref 0.3–4.0)
CK, MB: 3.5 ng/mL (ref 0.3–4.0)
Relative Index: INVALID (ref 0.0–2.5)
Total CK: 71 U/L (ref 7–177)
Total CK: 76 U/L (ref 7–177)
Troponin I: 0.04 ng/mL (ref 0.00–0.06)

## 2010-10-28 LAB — CBC
HCT: 40.2 % (ref 36.0–46.0)
HCT: 42.5 % (ref 36.0–46.0)
HCT: 46.2 % — ABNORMAL HIGH (ref 36.0–46.0)
HCT: 47.6 % — ABNORMAL HIGH (ref 36.0–46.0)
Hemoglobin: 12.5 g/dL (ref 12.0–15.0)
Hemoglobin: 13.6 g/dL (ref 12.0–15.0)
Hemoglobin: 14.1 g/dL (ref 12.0–15.0)
Hemoglobin: 14.3 g/dL (ref 12.0–15.0)
Hemoglobin: 14.3 g/dL (ref 12.0–15.0)
Hemoglobin: 16.3 g/dL — ABNORMAL HIGH (ref 12.0–15.0)
MCHC: 33.2 g/dL (ref 30.0–36.0)
MCHC: 33.6 g/dL (ref 30.0–36.0)
MCHC: 33.7 g/dL (ref 30.0–36.0)
MCHC: 34 g/dL (ref 30.0–36.0)
MCHC: 34 g/dL (ref 30.0–36.0)
MCV: 96.9 fL (ref 78.0–100.0)
MCV: 97.8 fL (ref 78.0–100.0)
MCV: 98 fL (ref 78.0–100.0)
MCV: 98.3 fL (ref 78.0–100.0)
MCV: 98.6 fL (ref 78.0–100.0)
MCV: 99.2 fL (ref 78.0–100.0)
Platelets: 202 10*3/uL (ref 150–400)
Platelets: 220 10*3/uL (ref 150–400)
Platelets: 223 10*3/uL (ref 150–400)
Platelets: 238 10*3/uL (ref 150–400)
Platelets: 267 10*3/uL (ref 150–400)
RBC: 3.8 MIL/uL — ABNORMAL LOW (ref 3.87–5.11)
RBC: 4.11 MIL/uL (ref 3.87–5.11)
RBC: 4.27 MIL/uL (ref 3.87–5.11)
RBC: 4.3 MIL/uL (ref 3.87–5.11)
RBC: 4.35 MIL/uL (ref 3.87–5.11)
RBC: 4.69 MIL/uL (ref 3.87–5.11)
RDW: 15 % (ref 11.5–15.5)
RDW: 15.2 % (ref 11.5–15.5)
WBC: 14 10*3/uL — ABNORMAL HIGH (ref 4.0–10.5)
WBC: 14.9 10*3/uL — ABNORMAL HIGH (ref 4.0–10.5)
WBC: 15 10*3/uL — ABNORMAL HIGH (ref 4.0–10.5)
WBC: 15.3 10*3/uL — ABNORMAL HIGH (ref 4.0–10.5)
WBC: 17 10*3/uL — ABNORMAL HIGH (ref 4.0–10.5)
WBC: 20.2 10*3/uL — ABNORMAL HIGH (ref 4.0–10.5)

## 2010-10-28 LAB — COMPREHENSIVE METABOLIC PANEL
ALT: 83 U/L — ABNORMAL HIGH (ref 0–35)
AST: 20 U/L (ref 0–37)
AST: 64 U/L — ABNORMAL HIGH (ref 0–37)
Albumin: 2.6 g/dL — ABNORMAL LOW (ref 3.5–5.2)
Alkaline Phosphatase: 159 U/L — ABNORMAL HIGH (ref 39–117)
CO2: 35 mEq/L — ABNORMAL HIGH (ref 19–32)
Calcium: 8.3 mg/dL — ABNORMAL LOW (ref 8.4–10.5)
Calcium: 8.4 mg/dL (ref 8.4–10.5)
Chloride: 100 mEq/L (ref 96–112)
Creatinine, Ser: 0.65 mg/dL (ref 0.4–1.2)
GFR calc Af Amer: >60 mL/min (ref >60)
GFR calc Af Amer: >60 mL/min (ref >60)
GFR calc non Af Amer: >60 mL/min (ref >60)
Glucose, Bld: 87 mg/dL (ref 70–99)
Potassium: 4.1 mEq/L (ref 3.5–5.1)
Sodium: 141 mEq/L (ref 135–145)
Total Bilirubin: 0.3 mg/dL (ref 0.3–1.2)
Total Protein: 5.7 g/dL — ABNORMAL LOW (ref 6.0–8.3)

## 2010-10-28 LAB — BASIC METABOLIC PANEL
BUN: 11 mg/dL (ref 6–23)
BUN: 21 mg/dL (ref 6–23)
CO2: 23 mEq/L (ref 19–32)
CO2: 27 mEq/L (ref 19–32)
CO2: 28 mEq/L (ref 19–32)
CO2: 30 mEq/L (ref 19–32)
Calcium: 8.5 mg/dL (ref 8.4–10.5)
Calcium: 8.7 mg/dL (ref 8.4–10.5)
Chloride: 100 mEq/L (ref 96–112)
Chloride: 101 mEq/L (ref 96–112)
Chloride: 103 mEq/L (ref 96–112)
Chloride: 105 mEq/L (ref 96–112)
Chloride: 107 mEq/L (ref 96–112)
Chloride: 108 mEq/L (ref 96–112)
Creatinine, Ser: 0.58 mg/dL (ref 0.4–1.2)
Creatinine, Ser: 0.68 mg/dL (ref 0.4–1.2)
Creatinine, Ser: 0.69 mg/dL (ref 0.4–1.2)
Creatinine, Ser: 0.72 mg/dL (ref 0.4–1.2)
Creatinine, Ser: 0.76 mg/dL (ref 0.4–1.2)
GFR calc Af Amer: >60 mL/min (ref >60)
GFR calc Af Amer: >60 mL/min (ref >60)
GFR calc Af Amer: >60 mL/min (ref >60)
GFR calc Af Amer: >60 mL/min (ref >60)
GFR calc Af Amer: >60 mL/min (ref >60)
GFR calc Af Amer: >60 mL/min (ref >60)
GFR calc non Af Amer: >60 mL/min (ref >60)
GFR calc non Af Amer: >60 mL/min (ref >60)
GFR calc non Af Amer: >60 mL/min (ref >60)
GFR calc non Af Amer: >60 mL/min (ref >60)
Glucose, Bld: 143 mg/dL — ABNORMAL HIGH (ref 70–99)
Glucose, Bld: 87 mg/dL (ref 70–99)
Glucose, Bld: 95 mg/dL (ref 70–99)
Potassium: 3.8 mEq/L (ref 3.5–5.1)
Potassium: 3.8 mEq/L (ref 3.5–5.1)
Potassium: 4.1 mEq/L (ref 3.5–5.1)
Potassium: 4.1 mEq/L (ref 3.5–5.1)
Potassium: 4.2 mEq/L (ref 3.5–5.1)
Potassium: 5 mEq/L (ref 3.5–5.1)
Sodium: 136 mEq/L (ref 135–145)
Sodium: 138 mEq/L (ref 135–145)
Sodium: 139 mEq/L (ref 135–145)
Sodium: 142 mEq/L (ref 135–145)
Sodium: 142 mEq/L (ref 135–145)

## 2010-10-28 LAB — MRSA PCR SCREENING: MRSA by PCR: NEGATIVE

## 2010-10-28 LAB — TYPE AND SCREEN
ABO/RH(D): O POS
Antibody Screen: NEGATIVE

## 2010-10-28 LAB — POCT I-STAT 3, ART BLOOD GAS (G3+)
Bicarbonate: 19.8 mEq/L — ABNORMAL LOW (ref 20.0–24.0)
Patient temperature: 98.7
TCO2: 21 mmol/L (ref 0–100)
pH, Arterial: 7.397 (ref 7.350–7.400)
pO2, Arterial: 55 mmHg — ABNORMAL LOW (ref 80.0–100.0)

## 2010-10-28 LAB — CULTURE, RESPIRATORY W GRAM STAIN

## 2010-10-28 LAB — APTT: aPTT: 24 seconds (ref 24–37)

## 2010-10-28 LAB — DIFFERENTIAL
Basophils Absolute: 0 10*3/uL (ref 0.0–0.1)
Basophils Relative: 0 % (ref 0–1)
Eosinophils Relative: 0 % (ref 0–5)
Lymphocytes Relative: 4 % — ABNORMAL LOW (ref 12–46)
Lymphocytes Relative: 4 % — ABNORMAL LOW (ref 12–46)
Lymphs Abs: 0.6 10*3/uL — ABNORMAL LOW (ref 0.7–4.0)
Neutro Abs: 8.6 10*3/uL — ABNORMAL HIGH (ref 1.7–7.7)

## 2010-10-28 LAB — AFB CULTURE WITH SMEAR (NOT AT ARMC)

## 2010-10-28 LAB — POCT CARDIAC MARKERS
CKMB, poc: <1 ng/mL — ABNORMAL LOW (ref 1.0–8.0)
Myoglobin, poc: 79.7 ng/mL (ref 12–200)
Troponin i, poc: <0.05 ng/mL (ref 0.00–0.09)

## 2010-10-28 LAB — CK TOTAL AND CKMB (NOT AT ARMC): Relative Index: INVALID (ref 0.0–2.5)

## 2010-10-28 LAB — TROPONIN I: Troponin I: 0.01 ng/mL (ref 0.00–0.06)

## 2010-10-28 LAB — PROTIME-INR
INR: 0.87 (ref 0.00–1.49)
Prothrombin Time: 11.7 seconds (ref 11.6–15.2)

## 2010-10-28 LAB — BRAIN NATRIURETIC PEPTIDE: Pro B Natriuretic peptide (BNP): 33 pg/mL (ref 0.0–100.0)

## 2010-11-03 ENCOUNTER — Encounter: Payer: Medicare Other | Attending: Physical Medicine & Rehabilitation

## 2010-11-03 ENCOUNTER — Ambulatory Visit: Payer: Medicare Other

## 2010-11-03 DIAGNOSIS — Z79899 Other long term (current) drug therapy: Secondary | ICD-10-CM | POA: Insufficient documentation

## 2010-11-03 DIAGNOSIS — IMO0002 Reserved for concepts with insufficient information to code with codable children: Secondary | ICD-10-CM

## 2010-11-03 DIAGNOSIS — M87029 Idiopathic aseptic necrosis of unspecified humerus: Secondary | ICD-10-CM | POA: Insufficient documentation

## 2010-11-03 DIAGNOSIS — J4489 Other specified chronic obstructive pulmonary disease: Secondary | ICD-10-CM | POA: Insufficient documentation

## 2010-11-03 DIAGNOSIS — M810 Age-related osteoporosis without current pathological fracture: Secondary | ICD-10-CM | POA: Insufficient documentation

## 2010-11-03 DIAGNOSIS — J449 Chronic obstructive pulmonary disease, unspecified: Secondary | ICD-10-CM | POA: Insufficient documentation

## 2010-11-03 DIAGNOSIS — M961 Postlaminectomy syndrome, not elsewhere classified: Secondary | ICD-10-CM

## 2010-11-03 DIAGNOSIS — G96198 Other disorders of meninges, not elsewhere classified: Secondary | ICD-10-CM | POA: Insufficient documentation

## 2010-11-03 DIAGNOSIS — M1610 Unilateral primary osteoarthritis, unspecified hip: Secondary | ICD-10-CM

## 2010-11-03 DIAGNOSIS — M545 Low back pain, unspecified: Secondary | ICD-10-CM | POA: Insufficient documentation

## 2010-11-07 ENCOUNTER — Encounter: Payer: Self-pay | Admitting: Internal Medicine

## 2010-11-08 ENCOUNTER — Encounter (INDEPENDENT_AMBULATORY_CARE_PROVIDER_SITE_OTHER): Payer: Medicare Other | Admitting: Vascular Surgery

## 2010-11-08 DIAGNOSIS — I7092 Chronic total occlusion of artery of the extremities: Secondary | ICD-10-CM

## 2010-11-08 NOTE — Consult Note (Signed)
NEW PATIENT CONSULTATION  [name], [name] DOB:  09/01/1950                                       11/08/2010 EAVWU#:[id number]  The patient presents today for evaluation of lower extremity arterial insufficiency.  She is a very frail 60 year old white female with multiple medical problems.  She had recent blistering over her left heel and underwent noninvasive vascular laboratory studies revealing some evidence of lower extremity arterial insufficiency.  I am seeing her for further discussion of this.  She reports that she had a similar ulcer on her right heel that healed spontaneously in the past.  She does not report any pain as currently related to the left heel.  She does have severe pulmonary compromise and is on continuous oxygen therapy.  She walks very little related to her shortness of breath.  Unbelievably she still continues to smoke 1 pack of cigarettes per day.  Does not drink alcohol.  FAMILY HISTORY:  Significant for severe CHF and her mother dying at age 46.  ALLERGIES:  Are to penicillin.  SOCIAL HISTORY:  She is married.  She does have a history of hypertension, paroxysmal atrial fibrillation, hyperglycemia, anxiety, depression, osteoarthritis and neurofibromatosis.  REVIEW OF SYSTEMS:  No weight loss or gain.  She weighs 112 pounds.  She is 5 feet 3 inches tall. VASCULAR:  Positive for pain in her legs with walking. CARDIAC:  Shortness of breath and arrhythmias. GI:  Negative. NEUROLOGIC:  Negative. PULMONARY:  Positive for home oxygen. Hematology, GU, ENT, musculoskeletal is negative.  PHYSICAL EXAM:  General:  Frail-appearing white female in no acute distress.  Vital signs:  Blood pressure 162/89, pulse 99, respirations 28.  HEENT:  Unremarkable.  Skin:  Remarkable for neurofibromatosis lesions.  Chest:  Clear bilaterally.  Heart:  Irregular.  Her radial pulses are 2+ bilaterally.  She does have palpable femoral  pulses bilaterally.  She does have palpable dorsalis pedis pulse on the right. I do not palpate pedal pulses on the left.  She does have a very superficial healing blister on her left heel.  I discussed this at length with the patient.  I do feel that she has adequate flow for healing.  I did review her noninvasive lower extremity arterial studies which did show probable focal occlusion above the knee at her distal superficial femoral artery.  I explained that this may be amenable to angioplasty and certainly would be amenable to bypass but do not feel that this is warranted currently since she is healing this superficial blister.  She will notify us should she have increasing pain or other tissue loss at which time we would recommend further evaluation.  Otherwise we will see her again on an as-needed basis.    [name], M.D. Electronically Signed  TFE/MEDQ  D:  11/08/2010  T:  11/08/2010  Job:  [id number]  cc:   Dr Duke Salvia

## 2010-11-10 ENCOUNTER — Ambulatory Visit: Payer: Self-pay | Admitting: Internal Medicine

## 2010-11-18 ENCOUNTER — Ambulatory Visit: Payer: Self-pay | Admitting: Internal Medicine

## 2010-11-22 LAB — POCT I-STAT, CHEM 8
BUN: 16 mg/dL (ref 6–23)
Calcium, Ion: 1.19 mmol/L (ref 1.12–1.32)
Hemoglobin: 18.7 g/dL — ABNORMAL HIGH (ref 12.0–15.0)
TCO2: 28 mmol/L (ref 0–100)

## 2010-12-02 ENCOUNTER — Ambulatory Visit: Payer: Medicare Other | Admitting: Neurosurgery

## 2010-12-09 ENCOUNTER — Other Ambulatory Visit (HOSPITAL_COMMUNITY): Payer: Self-pay | Admitting: Internal Medicine

## 2010-12-09 ENCOUNTER — Ambulatory Visit (INDEPENDENT_AMBULATORY_CARE_PROVIDER_SITE_OTHER): Payer: Medicare Other | Admitting: Internal Medicine

## 2010-12-09 ENCOUNTER — Encounter: Payer: Self-pay | Admitting: Internal Medicine

## 2010-12-09 VITALS — BP 122/78 | HR 98 | Ht 63.0 in | Wt 110.6 lb

## 2010-12-09 DIAGNOSIS — C3490 Malignant neoplasm of unspecified part of unspecified bronchus or lung: Secondary | ICD-10-CM | POA: Insufficient documentation

## 2010-12-09 DIAGNOSIS — J984 Other disorders of lung: Secondary | ICD-10-CM

## 2010-12-09 DIAGNOSIS — J449 Chronic obstructive pulmonary disease, unspecified: Secondary | ICD-10-CM

## 2010-12-09 DIAGNOSIS — F17200 Nicotine dependence, unspecified, uncomplicated: Secondary | ICD-10-CM

## 2010-12-09 MED ORDER — ALPRAZOLAM 1 MG PO TABS: ORAL_TABLET | ORAL | Status: DC

## 2010-12-09 NOTE — Progress Notes (Signed)
Subjective:    Patient ID: [name], female    DOB: May 30, 1951, 60 y.o.   MRN: [id number]  HPI 89 yoF smoker followed for COPD, tobacco abuse and lung nodule, still smoking. Complicating hx of extensive neurofibromatosis, depression, osteopenia. Last here August 11, 2010 after hosp twice for exacerbations of COPD, unable to afford meds. Oxygen dependent. During hosp CXR showed 1.4x1.0 LUL nodule.  Since last here, she had exacerbation with dyspnea while visiting sister in [place]. They admitted her to [facility] again 2-3 weeks ago- breathing better, but upped O2 to 5 L. They did needle bx of the lung nodule and told her POS for CANCER. She knows no more than that with no f/u planned at [facility].We are requesting records. She wants care here where she lives w/ husband. Very anxious- asks help for nerves- has taken xanax 1 mg in past w/o oversedation. Feels ok. Denies cough, phlegm, blood, pain or swelling. Easy DOE across room. Feet not swelling.   Review of Systems See HPI Constitutional:   No weight loss, night sweats, fevers, chills, fatigue, lassitude. HEENT:   No headaches,  Difficulty swallowing,  Tooth/dental problems,  Sore throat,                No sneezing, itching, ear ache, nasal congestion, post nasal drip,   CV:  No chest pain,  Orthopnea, PND, swelling in lower extremities, anasarca, dizziness, palpitations  GI  No heartburn, indigestion, abdominal pain, nausea, vomiting, diarrhea, change in bowel habits, loss of appetite  Resp:.  No coughing up of blood.  No change in color of mucus.  No wheezing.   Skin: She had scratched off an irritated fibroma on her abdomen  GU: no dysuria, change in color of urine, no urgency or frequency.  No flank pain.  MS:  No joint pain or swelling.  No decreased range of motion.  No back pain.  Psych:  No change in mood or affect. No memory loss. Very anxious.      Objective:   Physical Exam General- Alert, Oriented, Affect-appropriate,  Distress- none acute but anxious.     O2 portable on 5 L   VSx- 97% sat on 5 l O2, dropped to 85% on RA at rest. P reg 98/min, 122/ 78, Wt 110.6 lbs, 63" tall  Skin- Innumerable neurofibromas, with cafe au lait spots on back.   Lymphadenopathy- none  Head- atraumatic  Eyes- Gross vision intact, PERRLA, conjunctivae clear secretions  Ears- Normal-  Hearing, canals, Tm L ,    ,  Nose- Clear, no- Septal dev, mucus, polyps, erosion, perforation   Throat- Mallampati II , mucosa clear , drainage- none, tonsils- atrophic          Edentulous  Neck- flexible , trachea midline, no stridor , thyroid nl, carotid no bruit  Chest - symmetrical excursion , unlabored     Heart/CV- RRR , no murmur , no gallop  , no rub, nl s1 s2                     - JVD- none , edema- none, stasis changes- none, varices- none     Lung- Crackles, diminished,      wheeze- none, cough- none , dullness-none, rub- none     Chest wall-   Abd- tender-no, distended-no, bowel sounds-present, HSM- no  Br/ Gen/ Rectal- Not done, not indicated  Extrem- cyanosis- none, clubbing, none, atrophy- none, strength- nl  Neuro- grossly intact to observation  Assessment & Plan:

## 2010-12-09 NOTE — Assessment & Plan Note (Addendum)
Severe COPD with little bronchitis component. This will impact treatment options for lung nodule.

## 2010-12-09 NOTE — Patient Instructions (Signed)
Orders-  1) Records from [facility]                 2) [facility] schedule PET scan head to thigh, for dx lung nodule, lung cancer NOS  Please try hard now to stop smoking. Use gum, nicotine patches or any other method that works for you.

## 2010-12-09 NOTE — Assessment & Plan Note (Signed)
I have emphasized it is not too late to quit. She is going to try gum.

## 2010-12-09 NOTE — Assessment & Plan Note (Signed)
We had preliminary discussion. We have requested records and path from [facility] With that we will help her understand choices. Meanwhile we will get PET for staging.

## 2010-12-12 ENCOUNTER — Encounter: Payer: Medicare Other | Attending: Physical Medicine & Rehabilitation | Admitting: Neurosurgery

## 2010-12-12 DIAGNOSIS — J449 Chronic obstructive pulmonary disease, unspecified: Secondary | ICD-10-CM | POA: Insufficient documentation

## 2010-12-12 DIAGNOSIS — M545 Low back pain, unspecified: Secondary | ICD-10-CM | POA: Insufficient documentation

## 2010-12-12 DIAGNOSIS — J4489 Other specified chronic obstructive pulmonary disease: Secondary | ICD-10-CM | POA: Insufficient documentation

## 2010-12-12 DIAGNOSIS — G96198 Other disorders of meninges, not elsewhere classified: Secondary | ICD-10-CM | POA: Insufficient documentation

## 2010-12-12 DIAGNOSIS — M810 Age-related osteoporosis without current pathological fracture: Secondary | ICD-10-CM | POA: Insufficient documentation

## 2010-12-12 DIAGNOSIS — M48061 Spinal stenosis, lumbar region without neurogenic claudication: Secondary | ICD-10-CM

## 2010-12-12 DIAGNOSIS — Z79899 Other long term (current) drug therapy: Secondary | ICD-10-CM | POA: Insufficient documentation

## 2010-12-12 DIAGNOSIS — M87029 Idiopathic aseptic necrosis of unspecified humerus: Secondary | ICD-10-CM | POA: Insufficient documentation

## 2010-12-13 NOTE — Assessment & Plan Note (Signed)
This is a patient who sees Dr. Riley Kill.  He is with a history of low back pain.  The patient states that she was recently diagnosed with what she believes to be lung cancer at [facility].  She is going to be following up hopefully here in [place] according to her, but she is here today for the same problem with low back pain and some left leg pain.  She rates her pain about 7.  Her activity level is 4-5.  She does not indicate what makes the pain better or worse.  She does continue on Percocet.  She states that Dr. Riley Kill started her on Opana at her last visit.  She threw away bottle medicine and does not have it with her today due to the fact that it made her sick.  Her Percocet today had 75 pill count, it was filled on Dec 09, 2010.  Mobility, she can only walk for about 5 minutes.  She is on 2 L of oxygen.  She does not drive.  She is able to climb steps.  She has been out of work since 2001.  She has problems with dizziness.  Review of systems is notable for those difficulties as described in the history of present illness, past medical history.  Review of systems is otherwise unremarkable.  Past medical history is unchanged.  SOCIAL HISTORY:  She is married.  She does smoke at least a pack a day.  FAMILY HISTORY:  Significant for heart disease, lung disease, diabetes, and hypertension.  She may have a new diagnosis of lung cancer.  PHYSICAL EXAMINATION:  VITAL SIGNS:  Blood pressure 163/98, pulse 100, respirations 24, O2 sats 100% on 5 L. NEUROLOGIC:  Motor strength is diminished in the upper and lower extremities.  Sensation appears to be intact to pinprick throughout the upper and lower extremities.  Orientation, she is oriented x3.  She appears somewhat depressed.  She is somewhat disheveled and thin.  She can only flex to about 60 degrees and extend to about 0 with some pain and shortness of breath with any kind of exertion.  ASSESSMENT: 1. Low back pain with some  mild left radiculopathy. 2. Questionable new diagnosis of lung cancer. 3. Depression. 4. Tobacco abuse.  PLAN: 1. She will follow up with her cancer doctors at [facility] as     scheduled.  She may have some kind of followup schedule here, but I     am not sure exactly with whom or when. 2. She was not given prescriptions today as she just got her Percocet     filled. 3. She wants to talk to Dr. Riley Kill in person about restarting her     methadone since she threw away her Opana, and I have encouraged her     to call back to speak to him. 4. She will follow up with [place] in 1 month.  Her questions were     encouraged and answered.     Missy Baksh L. Blima Dessert    RLW/MedQ D:  12/12/2010 15:07:25  T:  12/13/2010 04:54:09  Job #:  [id number]

## 2010-12-15 ENCOUNTER — Encounter (HOSPITAL_COMMUNITY): Payer: Self-pay

## 2010-12-15 ENCOUNTER — Encounter (HOSPITAL_COMMUNITY)
Admission: RE | Admit: 2010-12-15 | Discharge: 2010-12-15 | Disposition: A | Discharge status: Home / Self Care | Payer: Medicare Other | Source: Ambulatory Visit | Attending: Internal Medicine | Admitting: Internal Medicine

## 2010-12-15 DIAGNOSIS — L723 Sebaceous cyst: Secondary | ICD-10-CM | POA: Insufficient documentation

## 2010-12-15 DIAGNOSIS — I7090 Unspecified atherosclerosis: Secondary | ICD-10-CM | POA: Insufficient documentation

## 2010-12-15 DIAGNOSIS — K5730 Diverticulosis of large intestine without perforation or abscess without bleeding: Secondary | ICD-10-CM | POA: Insufficient documentation

## 2010-12-15 DIAGNOSIS — C3490 Malignant neoplasm of unspecified part of unspecified bronchus or lung: Secondary | ICD-10-CM

## 2010-12-15 DIAGNOSIS — C3410 Malignant neoplasm of upper lobe, unspecified bronchus or lung: Secondary | ICD-10-CM | POA: Insufficient documentation

## 2010-12-15 DIAGNOSIS — J984 Other disorders of lung: Secondary | ICD-10-CM

## 2010-12-15 DIAGNOSIS — D1779 Benign lipomatous neoplasm of other sites: Secondary | ICD-10-CM | POA: Insufficient documentation

## 2010-12-15 LAB — GLUCOSE, CAPILLARY: Glucose-Capillary: 78 mg/dL (ref 70–99)

## 2010-12-15 MED ORDER — FLUDEOXYGLUCOSE F - 18 (FDG) INJECTION
18.7000 | Freq: Once | INTRAVENOUS | Status: AC | PRN
  Administered 2010-12-15: 18.7 via INTRAVENOUS

## 2010-12-18 ENCOUNTER — Encounter: Payer: Self-pay | Admitting: Internal Medicine

## 2010-12-19 ENCOUNTER — Emergency Department (HOSPITAL_COMMUNITY): Payer: Medicare Other

## 2010-12-19 ENCOUNTER — Inpatient Hospital Stay (HOSPITAL_COMMUNITY)
Admission: EM | Admit: 2010-12-19 | Discharge: 2010-12-22 | DRG: 917 | Disposition: A | Discharge status: Home / Self Care | Payer: Medicare Other | Attending: Internal Medicine | Admitting: Internal Medicine

## 2010-12-19 DIAGNOSIS — J8410 Pulmonary fibrosis, unspecified: Secondary | ICD-10-CM | POA: Diagnosis present

## 2010-12-19 DIAGNOSIS — Q8500 Neurofibromatosis, unspecified: Secondary | ICD-10-CM | POA: Diagnosis present

## 2010-12-19 DIAGNOSIS — J9620 Acute and chronic respiratory failure, unspecified whether with hypoxia or hypercapnia: Secondary | ICD-10-CM | POA: Diagnosis present

## 2010-12-19 DIAGNOSIS — J4489 Other specified chronic obstructive pulmonary disease: Secondary | ICD-10-CM | POA: Diagnosis present

## 2010-12-19 DIAGNOSIS — F17200 Nicotine dependence, unspecified, uncomplicated: Secondary | ICD-10-CM | POA: Diagnosis present

## 2010-12-19 DIAGNOSIS — F341 Dysthymic disorder: Secondary | ICD-10-CM | POA: Diagnosis present

## 2010-12-19 DIAGNOSIS — Y92009 Unspecified place in unspecified non-institutional (private) residence as the place of occurrence of the external cause: Secondary | ICD-10-CM

## 2010-12-19 DIAGNOSIS — I10 Essential (primary) hypertension: Secondary | ICD-10-CM | POA: Diagnosis present

## 2010-12-19 DIAGNOSIS — R680 Hypothermia, not associated with low environmental temperature: Secondary | ICD-10-CM | POA: Diagnosis present

## 2010-12-19 DIAGNOSIS — J449 Chronic obstructive pulmonary disease, unspecified: Secondary | ICD-10-CM | POA: Diagnosis present

## 2010-12-19 DIAGNOSIS — G894 Chronic pain syndrome: Secondary | ICD-10-CM | POA: Diagnosis present

## 2010-12-19 DIAGNOSIS — F19921 Other psychoactive substance use, unspecified with intoxication with delirium: Secondary | ICD-10-CM | POA: Diagnosis present

## 2010-12-19 DIAGNOSIS — Z79899 Other long term (current) drug therapy: Secondary | ICD-10-CM

## 2010-12-19 DIAGNOSIS — T50991A Poisoning by other drugs, medicaments and biological substances, accidental (unintentional), initial encounter: Principal | ICD-10-CM | POA: Diagnosis present

## 2010-12-19 DIAGNOSIS — C3490 Malignant neoplasm of unspecified part of unspecified bronchus or lung: Secondary | ICD-10-CM | POA: Diagnosis present

## 2010-12-20 ENCOUNTER — Emergency Department (HOSPITAL_COMMUNITY): Payer: Medicare Other

## 2010-12-20 LAB — BASIC METABOLIC PANEL
BUN: 11 mg/dL (ref 6–23)
CO2: 25 mEq/L (ref 19–32)
Chloride: 112 mEq/L (ref 96–112)
GFR calc Af Amer: >60 mL/min (ref >60)
Potassium: 4.7 mEq/L (ref 3.5–5.1)

## 2010-12-20 LAB — COMPREHENSIVE METABOLIC PANEL
ALT: 8 U/L (ref 0–35)
Albumin: 3 g/dL — ABNORMAL LOW (ref 3.5–5.2)
Calcium: 8.8 mg/dL (ref 8.4–10.5)
GFR calc Af Amer: >60 mL/min (ref >60)
Glucose, Bld: 87 mg/dL (ref 70–99)
Potassium: 3.9 mEq/L (ref 3.5–5.1)
Sodium: 140 mEq/L (ref 135–145)
Total Protein: 6 g/dL (ref 6.0–8.3)

## 2010-12-20 LAB — URINALYSIS, ROUTINE W REFLEX MICROSCOPIC
Bilirubin Urine: NEGATIVE
Ketones, ur: NEGATIVE mg/dL
Nitrite: NEGATIVE
Protein, ur: NEGATIVE mg/dL
Urobilinogen, UA: 0.2 mg/dL (ref 0.0–1.0)

## 2010-12-20 LAB — DIFFERENTIAL
Basophils Relative: 1 % (ref 0–1)
Eosinophils Absolute: 0.5 10*3/uL (ref 0.0–0.7)
Eosinophils Relative: 7 % — ABNORMAL HIGH (ref 0–5)
Lymphs Abs: 0.9 10*3/uL (ref 0.7–4.0)
Monocytes Relative: 14 % — ABNORMAL HIGH (ref 3–12)
Neutrophils Relative %: 65 % (ref 43–77)

## 2010-12-20 LAB — CBC
Hemoglobin: 14.5 g/dL (ref 12.0–15.0)
MCH: 30.2 pg (ref 26.0–34.0)
MCV: 91.8 fL (ref 78.0–100.0)
MCV: 92.6 fL (ref 78.0–100.0)
Platelets: 176 10*3/uL (ref 150–400)
Platelets: 201 10*3/uL (ref 150–400)
RBC: 4.51 MIL/uL (ref 3.87–5.11)
RBC: 4.84 MIL/uL (ref 3.87–5.11)
RDW: 15.6 % — ABNORMAL HIGH (ref 11.5–15.5)
WBC: 7.3 10*3/uL (ref 4.0–10.5)

## 2010-12-20 LAB — ACETAMINOPHEN LEVEL: Acetaminophen (Tylenol), Serum: <15 ug/mL (ref 10–30)

## 2010-12-20 LAB — ETHANOL: Alcohol, Ethyl (B): <11 mg/dL — ABNORMAL HIGH (ref 0–10)

## 2010-12-20 LAB — RAPID URINE DRUG SCREEN, HOSP PERFORMED
Cocaine: NOT DETECTED
Tetrahydrocannabinol: NOT DETECTED

## 2010-12-20 LAB — TRICYCLICS SCREEN, URINE: TCA Scrn: NOT DETECTED

## 2010-12-20 MED ORDER — IOHEXOL 300 MG/ML  SOLN
100.0000 mL | Freq: Once | INTRAMUSCULAR | Status: AC | PRN
  Administered 2010-12-20: 100 mL via INTRAVENOUS

## 2010-12-20 NOTE — Assessment & Plan Note (Signed)
[name] is back regarding her back pain.  She complains of more pain  particularly in the left leg over the last couple months.  Incidentally,  she stopped her Keppra a couple of months ago, but she does not recall  why.  She thinks it is because she did not feel that it was working.  She rates her pain as 6-7/10.  She describes the pain as stabbing and  aching.  She remains on her Percocet for breakthrough pain as well as a  methadone 20 mg t.i.d.  She states that pain interferes with general  activity, relations with others, enjoyment of life on a moderate level.  Sleep is fair with the Restoril.  Pain has been worse with the cooler  whether over the last few days especially.   On review of systems, the patient reports numbness, trouble walking,  depression, anxiety, shortness of breath at her baseline.  Other  pertinent positives are above, and full review is in the written health  and history section of the chart.  Her Oswestry score today is 68%.   SOCIAL HISTORY:  The patient is married and no new changes are reported  today.  She has been enjoying her new grandchild.   PHYSICAL EXAMINATION:  Blood pressure is 144/86, pulse is 104,  respiratory rate 18.  She is sating 92% on room air.  The patient is  pleasant.  Affect is generally bright and appropriate.  She had intact  strength in both legs at 5/5.  Her deep tendon reflexes at the ankles  may have been a bit diminished.  She is 2+ at the knees.  Sensation is  grossly intact.  She continues to have a kyphotic posture, but has fair  range of motion in the lower lumbar spine with flexion and extension  today.  Heart is tachycardic.  Chest is clear.  Abdomen is soft and  nontender.   ASSESSMENT:  1. Chronic low back pain status post lumbar laminectomy with epidural      fibrosis L4 through S1.  2. History of spinal stimulator.  3. Avascular necrosis.  4. Osteoporosis.  5. Severe oxygen-dependent chronic obstructive pulmonary  disease.  6. Tobacco abuse.   PLAN:  1. I would like to resume Keppra as it may have been actually helping      her.  We will begin at 500 mg nightly and titrate up to b.i.d.      after 1 week.  2. Begin a trial of Pamelor 10 mg to 20 mg nightly for sleep.  3. Refilled methadone 10 mg 2 t.i.d. #180.  4. Refilled Percocet 5 one q.6 hours p.r.n.  5. We will check CT with and without contrast of the back to assess      her for any new changes.  Consider intervention depending on      results.  6. I will see her back in 2 months' time with 2-month followup in      [facility]ursing [facility].      Ranelle Oyster, M.D.  Electronically Signed     ZTS/MedQ  D:  11/02/2008 10:13:26  T:  11/03/2008 00:43:59  Job #:  [id number]   cc:   Kerrin Champagne, M.D.  Fax: [phone]

## 2010-12-20 NOTE — Letter (Signed)
November 10, 2009   Lenon Curt. Chilton Si, MD  [address]  [place], [place]  [address]   Re:  [name], [name]               DOB:  1950-09-30   Dear Dr. Chilton Si:   The patient was recently in the hospital with prolonged air leak and was  requiring insertion of the chest tube and finally insertion of one-way  valve to stop her air leak.  She comes back today.  Her lungs completely  expanded.  Removed her chest tube sutures.  She is still short of  breath.  I told her not to smoke anymore.  From my standpoint though she  is stable will need to have close followup from her medical standpoint.  We will plan to remove her one-way valve on the 20th at [facility].   Ines Bloomer, M.D.  Electronically Signed   DPB/MEDQ  D:  11/10/2009  T:  11/11/2009  Job:  [id number]

## 2010-12-20 NOTE — Op Note (Signed)
[name], [name]               ACCOUNT NO.:  [id number]   MEDICAL RECORD NO.:  [id number]          PATIENT TYPE:  INP   LOCATION:  1607                         FACILITY[facility]  [facility]   PHYSICIAN:  Kerrin Champagne, M.D.   DATE OF BIRTH:  Mar 17, 1951   DATE OF PROCEDURE:  11/19/2007  DATE OF DISCHARGE:                               OPERATIVE REPORT   PREOPERATIVE DIAGNOSIS:  Right impacted valgus angulated femoral neck  fracture, Garden II variety.   POSTOPERATIVE DIAGNOSIS:  Right impacted valgus angulated femoral neck  fracture, Garden II variety.   PROCEDURE:  Closed reduction, internal fixation right femoral neck  fracture, with four 6.5 cannulated screws.   SURGEON:  Kerrin Champagne, M.D.   ASSISTANTS:  None.   ANESTHESIA:  General via orotracheal intubation, Jenelle Mages. Rica Mast,  M.D.   FINDINGS:  As above.   ESTIMATED BLOOD LOSS:  15 mL.   COMPLICATIONS:  None.   DRAINS:  Foley to straight drain.   The patient returned to the PACU in good condition.   HISTORY OF PRESENT ILLNESS:  This 60 year old female presented to the  emergency room at [facility] via ambulance after having fallen  while stepping out of her truck to go to an [facility] facility.  She apparently fell in the parking lot, landing on her right hip.  Seen  in the emergency room.  X-rays demonstrated an impacted right valgus  femoral neck fracture.  She has a history of osteoporosis, severe COPD,  and is on home oxygen.  Smokes 1 to 1-1/2 packs of cigarettes per day  for multiple years.  She underwent preoperative medical evaluation, felt  to be stable, and is brought to the operating room to undergo closed  reduction of right femoral neck fracture with pinning.  As she is 60  years of age, all attempts will be made to save the native femoral neck  and head.   INTRAOPERATIVE FINDINGS:  As above.  Fracture was reduced to anatomic  position and alignment.   DESCRIPTION OF PROCEDURE:   After adequate general anesthesia, with the  patient on the [facility] fracture table, the right lower extremity in  longitudinal traction and abduction, and placed into flexion.  C-arm  fluoro brought into the field.  Under the C-arm fluoro, directed  pressure was placed over the femoral neck fracture, reducing the  anterior angular deformity and reducing the fracture to anatomic  position and alignment.  This was noted in both AP and lateral planes.  Standard preoperative antibiotics of vancomycin.  Standard prep with  DuraPrep solution on the right lower limb, circumferentially about the  right thigh, draped in the usual manner.  Iodine Vi-Drape was used.  An  incisional approximately 3 to 3-1/2 inches in length directly across  from the lesser trochanter subcutaneous directly, directed down to the  tensor fascia .The fascia lata was incised and the vastus lateralis  muscle incised preserving a cuff for closure.  Using the multipin angle  guide 4 guide pins were placed from the lateral proximal femur crossing  the fracture site. Care  taken not to penetrate the hip joint.  A total  of four screws were placed, cannulated 6.5 screws were used. Layered  closure was then carried out following the removal of the guide pins and  use of the C-arm to verify accurate screw placement. A dressing with  2x2s and  Tegaderm.  The patient was then reactivated, extubated, and  returned to the respiratory rate in satisfactory condition.  Prior to  surgery, the patient had marking of her skin for correct leg for  surgery.  Also, intraoperatively, the patient had time-out for  identification of the patient, the side of surgery, surgical procedure,  and expected complications.      Kerrin Champagne, M.D.  Electronically Signed     JEN/MEDQ  D:  11/25/2007  T:  11/25/2007  Job:  [id number]

## 2010-12-20 NOTE — Assessment & Plan Note (Signed)
[facility]                         GASTROENTEROLOGY OFFICE NOTE   NAME:[name], [name] BROSSART                      MRN:          [id number]  DATE:06/07/2007                            DOB:          May 04, 1951    PROGRESS NOTE.   [name] is a 60 year old white female with no fibromatosis who we  have evaluated at the request of Dr. Constance Goltz for abnormal CT scan of  the abdomen indicating dilated intrahepatic ducts.  Her ERCP, however,  showed that there was no significant obstruction in her common bile duct  which was somewhat dilated but not obstructed, there were no stones or  cancer.  She had laparoscopic cholecystectomy in October, 2007.  She has  a chronic back pain for which she has had implanted a stimulator in the  back, the battery needs to be changed soon.  She also has chronic  anxiety and has taken some anxiolytic agents.  Patient continues to  smoke and has been oxygen dependent at times, being evaluated by Dr.  Sherene Sires in past.  She has COPD by chest x-ray.  The patient called  yesterday and was quite distressed about her abdominal distention and  bloating.  Her bowel habits apparently are regular but she has been  taking a lot of Maalox and has not been able to eat well, although her  weight has actually increased by 4 pounds from her last visit in August  of this year.   MEDICATIONS:  Oxygen at home.  Blood pressure medication, patient does  not know what kind.  She ran out of her Vicodin and Xanax.   PHYSICAL EXAM:  Blood pressure 114/70, pulse 88 and weight 131 pounds.  The patient was very nervous and anxious.  She was alert and oriented,  sclera is not icteric.  She had clearly neurofibromatosis of the skin.  LUNGS:  Clear to auscultation.  COR:  With rapid S1 S2, no murmur.  ABDOMEN:  Initially quite protuberant and tense as she was sitting up  but as I distracted her her abdomen relaxed and it was quite flat.  It  was tender  diffusely in periumbilical area.  Liver edge was at costal  margin, there was no ascites.  Lower abdomen was unremarkable.  RECTAL EXAM:  There was a small amount of hemoccult negative stool in  the rectal ampulla.  EXTREMITIES:  No edema.   IMPRESSION:  A 60 year old white female with abdominal distention and  bloating and nausea which has been partly evaluated short of colonoscopy  and no definite diagnosis has been made.  Some of her symptoms are  clearly related to a functional problem and anxiety.  Some of the  abdominal symptoms may be also originating in her back because of the  chronic back pain.  She continues to smoke and smoking itself could lead  to aerophagia and abdominal bloating.   PLAN:  1. Pt has never had a colonoscopy for screening. She ought to have one      as part of evaluating her abdominal pain. There is a high incidence  of colon cancer in patients with neurofibromatosis.  2. Refill on Xanax 1 mg, dispense 60, one p.o. b.i.d.  3. Samples of Prilosec 10 mg given to the patient that will last for      at least a couple of weeks.  4. Percocet 5/325, dispense 40, one p.o. q.6 to 8 hours p.r.n. back      and abdominal pain.   I encouraged patient and her husband strongly to stop smoking.     Hedwig Morton. Juanda Chance, MD  Electronically Signed    DMB/MedQ  DD: 06/07/2007  DT: 06/07/2007  Job #: [id number]   cc:   Kendrick Ranch, M.D.

## 2010-12-20 NOTE — H&P (Signed)
[name], [name]               ACCOUNT NO.:  [id number]  MEDICAL RECORD NO.:  [id number]           PATIENT TYPE:  E  LOCATION:  [facility]                         FACILITY:  [facility]  PHYSICIAN:  Andreas Blower, MD       DATE OF BIRTH:  1951-01-29  DATE OF ADMISSION:  12/19/2010 DATE OF DISCHARGE:                             HISTORY & PHYSICAL   PRIMARY CARE PHYSICIAN:  The patient does not have.  CHIEF COMPLAINT:  Altered mental status.  HISTORY OF PRESENT ILLNESS:  [name] is a 60 year old Caucasian female with history of neurofibromatosis, depression, anxiety, COPD, hypertension, possible new diagnosis of lung cancer with workup started a [facility] who presents with the above complaints.  The patient is not able to provide any history given her altered mental status.  Most of the history was obtained from talking to the patient's daughter [name], telephone number [phone].  Around 9:30 PM, patient's husband called his stepdaughter because the patient had slightly labored breathing and was not arousable.  There was some concern whether she may have taken more of her amitriptyline medications.  Per her family they did not believe that the patient was trying to kill herself.  The patient's daughter did note that some of the patient's friend had come by earlier during the day and may have given her "Xanax bar."  The patient was quite somnolent and was not arousable.  As a result the hospitalist service was called to help further evaluate.  In the ER, she was given two doses of Narcan without any response and she was given 0.2 mg of flumazenil with immediate response and the patient workup briefly before becoming somulent.  Per the patient's daughter has not had any recent fevers, chills, has not had any cough, has not complained of shortness of breath, has not complained of any abdominal pain, diarrhea, does not complain of any vision changes.  The patient  does have chronic headaches per the daughter.  Also has not had any chest pain.  The daughter did note that the patient has been feeling a little bit depressed given the finding of lung mass on imaging. Daughter also noted that at home patient's saturation was intially in low 80s off oxygen and when oxygen was placed back on her saturation improved to 90s.  REVIEW OF SYSTEMS:  Could not be obtained from the patient is positive as per HPI, otherwise all other systems are negative.  PAST MEDICAL HISTORY: 1. History of COPD. 2. History of anxiety. 3. History of depression. 4. Possible new diagnosis of lung cancer and had biopsy at [facility] results were pending.  The patient is scheduled further     evaluation as an outpatient. 5. History of neurofibromatosis 6. History of hypertension currently not on any hypertensive     medications. 7. History of a traumatic pneumothorax secondary to a fall with chest     tube placement. 8. History of the altered mental status and somnolent in the past per     daughter with similar presentation. 9. Tobacco use.  SOCIAL HISTORY:  Could not be obtained from the patient, but per daughter is a smoker.  Denies any alcohol use.  Has not used any illegal drugs or substances.  FAMILY HISTORY:  Could not be obtained from the patient, but per daughter the patient's mother had heart failure, father had a throat cancer and a sister with MS.  PHYSICAL EXAM:  VITAL SIGNS:  Temperature is 95.3, blood pressure is 112/75, heart rate 88, respiration 18, satting at 94% on 2 liters of oxygen. GENERAL:  The patient was somnolent was lying in bed comfortably. Wokeup briefy to sternal rub.  Had spontaneous movements. HEENT: Pupils equal, round.  Had moist mucous membranes. NECK:  Supple. HEART:  Regular with S1 and S2. LUNGS:  Had good air movement, minimally scattered wheezing. ABDOMEN:  Soft, nontender, nondistended.  Positive bowel  sounds. EXTREMITIES:  The patient had good peripheral pulses.  Trace edema. NEURO:  Could not be assessed due to patient's compliance. Skin: Numerous nodular lesions through out the skin.  RADIOLOGY/IMAGING:  The patient had a head CT with and without contrast which shows no acute or metastatic intracranial abnormality.  Chronic right parietal meningioma is not significantly changed since 2008. Numerous chronic scalp soft tissue nodules.  The patient had portable chest x-ray which shows diffuse bilateral airspace disease which may represent pneumonia or edema.  Left upper lobe mass, compatible with carcinoma.  LABS:  CBC shows white count of 6.6, hemoglobin 13.6, hematocrit 41.4, platelet count 201.  Electrolytes normal with BUN of 16, creatinine 0.77.  Liver function tests normal except total protein 6.0, albumin 3.0.  Drug screen was positive for benzodiazepine, blood alcohol level was less than 11.  UA was negative for nitrites and leukocytes.  ASSESSMENT AND PLAN: 1. Acute delirium, most likely due to benzodiazepine overdose.  The     patient's mentation improved after receiving a small dose of     flumazenil.  After talking to the patient's daughter, the daughter     had concern that the patient may have been given a Xanax bar by the     patient's friend which may have led to the patient's delirium.     Supportive management for now.  Let the benzodiazepine wear off,     as she can protect her airway at this time. If the patient's     mentation does not improve we could can consider further workup. 2. Benzodiazepine overdose managed as mentioned above.  3. Possible new diagnosis of lung cancer.  The patient had PET scan     done recently on Dec 15, 2010 which shows hypermetabolic activity     within the left upper lobe nodule most consistent with diagnosis of     lung cancer, biopsy results were pending, further management as an     outpatient.  Workup as per the patient's  daughter has been started     at [facility].  The patient may have been scheduled for     oncologist locally. 4. Hypothermia may be due to benzodiazepine overdose.  We will admit     the patient to step-down for further monitoring as her temperature     and her mentation improves we could consider transitioning the     patient to regular floor. 5. History of neurofibromatosis stable at this time. 6. Chronic obstructive pulmonary disease, stable at this time.     Continue oxygen.  Per the patient's daughter there was some concern     that whether are  not she was hypoxic at home currently is     maintaining good saturation. 7. Depression, stable at this time continue paroxetine.  We will be     holding her amitriptyline and nortriptyline for the time being     until her mentation improves. 8. History of anxiety.  Holding benzodiazepine. 9. Chronic pain syndrome.  Holding Roxicodone and Percocet at this     time until her mentation improves. 10.History of hypertension, blood pressure is stable at this time.     The patient is not on any antihypertensive medications. 11.Questionable hypoxic respiratory failure.  The patient's     saturation has improved.  Chest x-ray shows a bilateral airspace     disease concerning for possible pneumonia.  We will start the     patient on empiric course of moxifloxacin. 12.Prophylaxis Lovenox for deep venous thrombosis prophylaxis.  CODE STATUS:  The patient is full code as she is confused at this time.  Time spent on admission, examining the patient, talking to the patient's daughter and coordinating care was 1 hour.     Andreas Blower, MD     SR/MEDQ  D:  12/20/2010  T:  12/20/2010  Job:  [id number]  Electronically Signed by Wardell Heath Portia Wisdom  on 12/20/2010 06:17:00 AM

## 2010-12-20 NOTE — Assessment & Plan Note (Signed)
[name] is back regarding her chronic back pain.  We tried methadone and  titrated up to 20 mg t.i.d. and she seems to have found something that  works for her and also is affordable.  She rates her pain at 6/10 today.  She has occasional moments when pain flares in her back and hips,  particularly when the weather gets cold or damp.  She describes the pain  as stabbing.  Pain interferes with general activity, relations with  others, and enjoyment of life on a moderate-to-severe level.  Sleep is  still poor at times.  She just started on the Restoril and did find that  it helped a great deal.  The patient's Oswestry score today is 76%.   REVIEW OF SYSTEMS:  Notable for numbness, anxiety, weight gain, or  shortness of breath.  Other pertinent positives as above.  Full review  is in the written health and history section of the chart.   SOCIAL HISTORY:  Unchanged.  She still smoking a half-pack per day.   PHYSICAL EXAMINATION:  VITAL SIGNS:  Blood pressure 140/83, pulse 87,  and respiratory rate 20.  She is sating 95% on 2.5 ` of oxygen.  GENERAL:  She is alert and oriented.  Seem to be a bit brighter today.  She is short of breath with activity.  HEART:  Regular rate.  CHEST:  Notable for wheezes.  ABDOMEN:  Soft and nontender.  EXTREMITIES:  She continues to walk with a kyphotic posture and has  palpation tenderness over the lower lumbar spine.  Pain is worse with  flexion and extension.  Her strength is 5/5 with pain inhibition  proximally.  Her sensation is generally normal on both legs.  Reflexes  are 1-2+.   ASSESSMENT:  1. Chronic low back pain, status post lumbar laminectomy with epidural      fibrosis L4 through S1.  2. Spinal stimulator history.  3. Avascular necrosis of the hips and osteoporosis.  4. Severe chronic obstructive pulmonary disease.  5. Tobacco abuse.   PLAN:  1. Continue with methadone 20 mg t.i.d. #180 of the 10 mg tablets.  2. Percocet 5 mg for  breakthrough pain 1 q.6 hours p.r.n., #75.  3. We discussed the importance of exercise, range of motion,      strengthening of her back.  I gave her some gentle exercises to      work on for her low back.  I would like to look at trying physical      therapy this spring after she works on her stamina a bit.  4. Add melatonin over-the-counter for sleep, 3-9 mg in addition to her      Restoril.  5. See her back in Nursing Clinic 1 month and 2 months with me.      Ranelle Oyster, M.D.  Electronically Signed     ZTS/MedQ  D:  09/02/2008 10:56:47  T:  09/03/2008 01:30:24  Job #:  [id number]   cc:   Kerrin Champagne, M.D.  Fax: [phone]

## 2010-12-20 NOTE — Assessment & Plan Note (Signed)
[name] is back regarding her back and hip pain.  She really has not had a  lot of change with Avinza trial.  She is unable to tolerate Lyrica, so  we changed to Keppra, which she has not found beneficial either.  She  has tolerated it better, however.  She continues to have lot of back  pain, which is sharp, stabbing, tingling, and aching.  She has pain in  the low back and radiating down the left leg to the ankle.  The ankle  pain is more tingling and burning in nature.  Pain is rated 6-7/10.  Pain interferes with general activity, in relationship with others, and  enjoyment of life on a moderate level.  Sleep is poor.   REVIEW OF SYSTEMS:  Notable for bladder control problems, numbness,  tingling, trouble walking, spasms, and depression.  Full review is in  the written health and history section of the chart.  Other pertinent  positives are above.   SOCIAL HISTORY:  The patient is married, living with her husband.  She  still smokes despite being on oxygen.   PHYSICAL EXAMINATION:  VITAL SIGNS:  Blood pressure 118/69, pulse is 86,  and respiratory rate 16.  She is sating 93% on room air.  GENERAL:  The patient is pleasant, alert and oriented x3.  She is  wearing her oxygen once again today.  SKIN:  Not changed.  BACK:  Low back has remained tender somewhat to range of motion and  flexion and extension as well as with palpation today.  She had pain  with lateral bending as well.  Strength is generally 5/5.  NEUROLOGIC:  Reflexes are intact and sensory exam is nonfocal.  HEART:  Tachycardic.  CHEST:  Clear.  ABDOMEN:  Soft and nontender.   ASSESSMENT:  1. Chronic low back pain/lumbar post-laminectomy syndrome.  2. History of spinal stimulator.  3. Questionable epidural fibrosis, L4 through S1.  4. History of avascular necrosis and osteoporosis.  5. Severe oxygen-dependent chronic obstructive pulmonary disease.  6. Persistent tobacco use.   PLAN:  1. We will increase Avinza to 60  mg q.12 h. with Percocet 5 for      breakthrough pain.  She is given 60 of each medication today.  2. We will increase Keppra to 500 mg t.i.d. after 1 week to see if she      notes any appreciable difference.  3. We will see her back in about 1 month's time.  I did add Rozerem 8      mg nightly for sleep.      Ranelle Oyster, M.D.  Electronically Signed     ZTS/MedQ  D:  06/05/2008 13:22:17  T:  06/06/2008 01:18:53  Job #:  [id number]   cc:   Kerrin Champagne, M.D.  Fax: [phone]

## 2010-12-20 NOTE — Op Note (Signed)
NAMEDAZIAH, HESLER               ACCOUNT NO.:  [id number]   MEDICAL RECORD NO.:  [id number]          PATIENT TYPE:  AMB   LOCATION:  ENDO                         FACILITY:  [facility]   PHYSICIAN:  Hedwig Morton. Juanda Chance, MD     DATE OF BIRTH:  26-Nov-1950   DATE OF PROCEDURE:  04/19/2007  DATE OF DISCHARGE:                               OPERATIVE REPORT   PROCEDURE:  Endoscopic retrograde cholangiopancreatography.   INDICATIONS:  This 60 year old white female has had dilated intrahepatic  ducts on intraoperative cholangiogram during cholecystectomy two years  ago.  She has been complaining of recurrent abdominal pain and had mild  elevation of liver function tests.  CT scan of the abdomen recently  showed biliary, as well as pancreatic duct dilatation.  There was no  mass.  She is undergoing an ERCP to rule out a retained common bile duct  stone.  She also has COPD and neurofibromatosis.   ENDOSCOPE:  Olympus single-channel side-viewing duodenoscope.   SEDATION:  Versed 10 mg IV, fentanyl 150 micrograms IV, Phenergan 25 mg  IV, and Glucagon 0.5 mg IV.   FINDINGS:  Patient required a large amount of sedation prior to the  procedure because she has been on maintenance dose of Xanax 1 mg three  times a day.  Olympus single-channel side-viewing duodenoscope was  passed blindly through the posterior oropharynx into esophagus, into the  stomach.  Gastric mucosa appeared normal.  Pyloric channel was normal.  Biliary papilla was located without difficulty and showed normal size.  Small amount of bile was exiting from the papilla.  It was intubated  without difficulty and, with the help of a guide wire, we obtained a  cholangiogram, using fluoroscopic guidance.  Common bile duct was  visualized and was at least 15 mm dilated, but smoothly tapered without  evidence of obstruction.  Bile was easily flowing out of the common bile  duct.  A 15 mm Wilson-Cook balloon swept bile duct twice with recovery  of only normal-looking bile.  There were no stones or sludge.  Occlusion  cholangiogram was obtained.  Surgical clips were visualized next to the  common bile duct.  Small amount of extravasation was noted at the  papilla.  Pancreatic duct was avoided.  Internal hepatic radicles were  mildly prominent.  Common bile duct emptied rapidly.  After obtaining  occlusion cholangiogram and review of the spot films, which showed no  evidence of retained stones, the procedure was terminated.  The patient  tolerated the procedure well.   IMPRESSION:  1. Dilated, nonobstructed common bile duct with mild dilatation of      intrahepatic radicles.  No evidence of obstruction.  2. Main pancreatic duct not seen.   PLAN:  Although the common bile duct seemed to be prominent, it is not  likely to be causing the patient's abdominal pain.  I would suggest  further evaluation.  The pain seemed to be relieved by antispasmodics.  She is also on Vicodin for chronic back pain.  Her pain threshold may be  somewhat low because of chronic use of antianxiety medications,  as well  as narcotics.  We will follow her liver function tests.      Hedwig Morton. Juanda Chance, MD  Electronically Signed     DMB/MEDQ  D:  04/19/2007  T:  04/19/2007  Job:  [id number]   cc:   Kendrick Ranch, M.D.  Fax: [phone]

## 2010-12-20 NOTE — Assessment & Plan Note (Signed)
[name] is back regarding her back and leg pain.  She has been generally  stable since I last saw her.  She is unable to fill her Cymbalta due to  cost.  She had a sleep study this week and she is awaiting the results.  She states that she slept poorly during the whole study.  The patient's  pain is stabbing, interferes with general activity, relations with  others, enjoyment of life on a moderate-to-severe level.  She remains  limited with her breathing due to her COPD as well.  Pain usually  worsens with most activity, improves with rest and medications.  [name] is  on methadone 20 mg t.i.d., Pamelor 20 mg at bedtime, and Percocet 5 one  q.6 hours p.r.n.   CT of the back really was unremarkable for any acute changes.  She had  some neurofibromas more likely at the S2 foramina bilaterally.   REVIEW OF SYSTEMS:  Notable for the above as well as anxiety, shortness  of breath, poor appetite.  Full review is in the written health and  history section of the chart.   SOCIAL HISTORY:  The patient is married, living with her husband.  She  is still smoking one-pack of cigarettes per day.   PHYSICAL EXAMINATION:  Blood pressure is 148/89, pulse is 105,  respiratory rate 16.  She is sating 94% on 2.5 L via oxygen.  The  patient is pleasant, alert and oriented x3.  Affect is generally bright  and appropriate.  Gait is stable.  She uses her oxygen when she walks.  Reflexes are 2+ at the knees.  Sensation is grossly intact in bilateral  lower extremities.  She has poor posture at sitting and standing.  Range  of motion was fair, although more pain with flexion than extension noted  today.  She remains tachycardiac.  Chest is generally clear with poor  air movement.  Abdomen is soft, nontender.  Skin is unchanged.   ASSESSMENT:  1. Chronic low back pain status post lumbar laminectomy with epidural      fibrosis L4 through S1.  2. History of spinal stimulator.  3. History avascular necrosis.  4.  Osteoporosis.  5. Severe oxygen-dependent chronic obstructive pulmonary disease.  6. Tobacco abuse.   PLAN:  1. We really need to try to work on sleep.  We will wait results of      sleep apnea study.  We will increase Pamelor to 25-50 mg at bedtime      for now.  Continue with methadone 20 mg t.i.d. scheduled, as well      as Percocet 5 one q.6 hours p.r.n.  We will increase her Percocet      to 90 a month to allow her a bit more freedom with her breakthrough      pain doses.  Between her financial situation and medications and      treatment she has had in the past, there really are not a lot of      options here for her at this point.  Encourage increasing activity      tolerance as      well.  2. We will see her back in 3 months with nurse clinic followup in 1      month's time.      Ranelle Oyster, M.D.  Electronically Signed     ZTS/MedQ  D:  01/08/2009 10:17:53  T:  01/09/2009 02:18:34  Job #:  [id number]   cc:  Kerrin Champagne, M.D.  Fax: [phone]

## 2010-12-20 NOTE — Discharge Summary (Signed)
NAMELARAY, RIVKIN               ACCOUNT NO.:  [id number]   MEDICAL RECORD NO.:  [id number]          PATIENT TYPE:  INP   LOCATION:  1607                         FACILITY:  [facility]   PHYSICIAN:  Kerrin Champagne, M.D.   DATE OF BIRTH:  03-24-1951   DATE OF ADMISSION:  11/19/2007  DATE OF DISCHARGE:  11/27/2007                               DISCHARGE SUMMARY   DISCHARGE DIAGNOSIS/PRIMARY DIAGNOSIS:  1. Right impacted valgus anterior angulated femoral neck fracture,      Garden 2 variety.  2. Severe chronic hypoxemia and respiratory failure with exacerbation      secondary to pulmonary edema.  3. Ongoing tobacco abuse.  4. Neurofibromatosis.  5. Hyperglycemia.  6. Chronic back pain status post spinal cord stimulator placement.  7. History of narcotic and benzodiazepine abuse with prior behavioral      health care admission for detox.  8. Depression.  9. Status post bilateral tubal ligation.  10.Status post appendectomy in 1974.  11.Status post laparoscopic cystectomy.  12.Mild diastolic dysfunction by echocardiogram August 2008.   PROCEDURES PERFORMED DURING THIS HOSPITALIZATION:  1. November 19, 2007 right hip fracture, closed reduction and internal      fixation with multiple cannulated screws by Dr. Vira Browns      orthopedic surgery.  2. Internal medicine consultation Dr. Jetty Duhamel on November 20, 2007.  3. Spiral CT scan performed on November 21, 2007, indicating severe      chronic interstitial obstructive lung disease.  No pulmonary      embolism.   CONSULTATIONS PERFORMED DURING THIS HOSPITALIZATION:  1. Internal medicine consultation Dr. Jetty Duhamel.  [facility] team      F.  2. Rehab and physical medicine consult, Dr. Hermelinda Medicus performed on      November 25, 2007.   The patient was admitted to the step-down unit for period of 4 days  following her initial surgery.   HISTORY OF PRESENT ILLNESS:  This patient is a 61 year old female who  presented with right hip  pain to the [facility] emergency room and  admitted to the emergency room via ambulance after having fallen from  her truck in a parking lot at [employer].  Unable to  stand.  Unable to ambulate.  Radiographs in the emergency room  demonstrated a right femoral neck fracture.  Valgus impacted with  anterior angulation deformity.  The patient has as strong history of  osteoporosis and is on calcium and vitamin D for this.  She has a  history of severe COPD and chronic back pain.  Has an indwelling spinal  cord stimulator.   PAST SURGICAL HISTORY:  Significant for lumbar microdiskectomy in 2004,  cholecystectomy 2006, previous bilateral tubal ligation, previous  appendectomy, previous cholecystectomy.   PAST MEDICAL HISTORY:  Significant for emphysema and chronic obstructive  pulmonary disease on home oxygen.  She has also a history of high blood  pressure neurofibromatosis.   FAMILY HISTORY:  Significant for neurofibromatosis.   SOCIAL HISTORY:  Smokes between a pack and a pack-and-a-half of  cigarettes since age  14, greater than 60 pack year history.  Negative  for EtOH.  Does have past history of use of medications, benzodiazepine,  and narcotic medicines primarily for back pain.  She is disabled and  unable to work.   ALLERGIES:  PENICILLIN.   CURRENT MEDICATIONS:  1. Cardizem 180 mg 2 tablets p.o. daily.  2. She has previously been on Prozac and Ambien, and no longer takes      these medicines.   REVIEW OF SYSTEMS:  Negative for upper respiratory tract infection  recently.  Does have a history of GI discomfort up to 6 days ago.  She  has some chronic swelling in her leg and was treated with Lasix in the  past.   ADMISSION VITAL SIGNS:  Temperature of 97.5, pulse 86, respiration 22,  blood pressure of 117/73.   PHYSICAL EXAM:  Alert, oriented x4, well-developed, well-nourished 38-  year-old female multiple subcutaneous nodules of skin over the face,   neck, and entire body.  Pupils equal, rectal and accommodation.  Extraocular muscle was intact.  Neck is supple with full range of motion, no bruits noted.  No  thyromegaly, no masses, no lymphadenopathy.  Chest with coarse rhonchi sounds throughout both lung fields.  BREASTS: Atrophic.  Non tender, no masses, no lymphadenopathy.  HEART: Regular rate and rhythm without murmur, rub or gallop.  ABDOMEN: Soft, nontender, positive bowel sounds.  PELVIC: Exam deferred as was GU exam deferred to her primary care  physician in [facility].  She has had a Foley catheter placed in the  emergency room.  Right leg with minimal shortening, slight external rotation.  Good  pulses.  Normal sensation both lower extremities.  Skin with  neurofibroma throughout.  Reportedly with left leg pain that is chronic  related to previous lumbar surgery with indwelling spinal cord  stimulator.   LABORATORY TESTS:  Demonstrated a white cell count of 16.8, hematocrit  was 44.5% with hemoglobin 15, platelet count 351,000.  Chemistry, sodium  140, potassium 3.8, chloride 109, CO2 21, BUN 12, creatinine 0.78 and  glucose 98.  Plain radiographs demonstrated a right femoral neck  fracture.  This is subcapital mid transcervical and it demonstrated  anterior angular deformity lateral view with some valgus about 10-15  degrees impaction.  Chest x-ray showing pulmonary fibrosis.  No acute to  air space opacities noted.   The patient underwent further testing include EKG preoperatively which  demonstrated normal sinus rhythm.  No nonspecific ST and T-wave  abnormalities, prolonged QT.  Otherwise no acute findings.   HOSPITAL COURSE:  This patient was seen in the emergency room evaluated.  She underwent type and cross.  She was brought to the operating room and  underwent acute closed reduction internal fixation of the right femoral  neck fracture use using multiple cannulated screws.  Her age dictated  attempts at trying  to manage this with preserving normal anatomy and  native femoral head and neck.  Surgery was performed within 8 hours of  her being brought to the emergency room.  Postoperatively, the patient,  while in the post-op recovery unit demonstrated significant pulmonary  compromise.  She was treated with multiple inhalant treatments.  However, due to desaturation and requirements of a rebreather 75% mask,  the patient was transferred to the step-down unit.  An internal medicine  consult was obtained with [facility] Dr. Sharon Seller who graciously saw this  patient and treated her for the next 4 days improving her lung function.  Her  initial hemoglobin of 15 diminished to approximately 13 during the  course of her treatment.  Postoperatively, however, she did not require  any blood transfusions.  She did have a fall while in the hospital and  followup radiographs demonstrated excellent position and alignment right  femoral neck fracture status post internal fixation with multiple  cannula screws.  The patient was treated initially with Lovenox then  placed on Coumadin for anti DVT prophylaxis.  The chest and respiratory  compromise continued while in the intensive care unit.  A CT scan was  obtained in order to rule out pulmonary embolism.  Results of this were  unremarkable for pulmonary embolism, but I have reinforced the concerns  regarding chronic pulmonary fibrosis and COPD changes.  The patient was  placed on a patch for her nicotine use.  Coumadin levels by  postoperative day number four had begun to normalize at 2 to 2.1.  Her  incision right thigh remained without erythema or drainage.  There are  staples intact here and these will need to be removed at 2 weeks  following her surgery.  The patient was transferred from the intensive  care unit to the regular orthopedic floor on November 24, 2007.  There she  remained stable.  She was placed on 6 liters per nasal cannula.  This  seemed to maintain  her saturations between 90 and 95%.  Her BNP checked  on April 17 was 79.5.  The patient underwent physical therapy and  occupational therapy, and she demonstrated extreme need for assistance  in activities of daily living and in ambulation of short distances so  that a prolonged recovery and prolonged rehabilitation course was  expected.  A rehabilitation medicine consult was obtained on November 25, 2007.  They indicated that the patient was a candidate for admission to  the inpatient rehab unit at [facility]  However, because of insurance reasons  and lack of bed availability, the patient was not able to be transferred  to the rehab facility.  Instead a skilled nursing home facility  placement was undertaken.  She remained stable throughout the remainder  of her course.  She demonstrates poor standing tolerance, does require a  walker for ambulation, touchdown weightbearing on the right lower  extremity needs to be maintained for a period of at least 6 weeks  following the surgery.  She is able to move herself in bed and able to  ambulate with a walker at this time to and from bathroom with minimal  assistance.  However, standby assistance is recommended.  On November 25, 2007, this patient demonstrated stable diastolic dysfunction, well  compensated.  On November 27, 2007 seen by Dr. Sharon Seller and felt to have  severe COPD with pulmonary fibrosis which was stable and back to her  baseline.  It was recommended that she discontinue all smoking secondary  to severe COPD and that she participate in a nonsmoking or discontinue  smoking program.  Her anxiety during the hospitalization was treated  with BuSpar and she tolerated this well.  On November 27, 2007 the rehab  facility indicated that the patient is not capable of being placed into  the rehab center at [facility].  The patient selected [facility] for a skilled nursing facility for continued rehabilitation  and convalescence  following a pinning of right hip fracture performed on  November 19, 2007.  On November 27, 2007 this patient was transferred to the  [facility] [place].  Discharge authorization was obtained prior  to transfer.   DISPOSITION:  Mrs. SOLENNE MANWARREN was discharged to the [facility] nursing home on November 27, 2007 after a hospitalization for  a right hip fracture that was acute November 19, 2007 and subsequent  postoperative recovery and exacerbation of severe COPD.  At the time of  discharge the patient is on 6 liters per nasal cannula for respiratory  insufficiency.  She is reportedly taking Coumadin 4 mg p.o. daily at 4  p.m. to maintain INR between 2 and 3.  This should be checked weekly by  pharmacy to ensure that her Coumadin level was not require adjusting.   DISCHARGE MEDICATIONS:  1. Home oxygen per nasal cannula 6 liters.  2. Cardizem 360 mg p.o. daily.  3. Coumadin 4 mg p.o. daily at 6 p.m.  4. Colace 100 mg p.o. b.i.d.  5. MiraLax 17 grams powder dissolved an 8 ounces of juice p.o. daily.  6. Albuterol (Ventolin) 2.5 mg in 3 mL nebulizer solution q.6h p.r.n.  7. Atrovent 0.5 mg in 2.5 mL absolution q.6h p.r.n.  8. Guaifenesin 600 mg p.o. b.i.d.  9. Nicotine patch 21 mg applied to chest q.24h removing the old patch.  10.BuSpar 10 mg p.o. b.i.d. for anxiety.  11.Advair Diskus 1 puff 250/50 p.o. b.i.d.  12.Singulair 10 mg p.o. daily.  13.Tylenol 325 mg 1 or 2 q.4-6h p.r.n. temperature 101.  14.Albuterol had been changed to a inhaler form 2 puffs p.o. q.4h      p.r.n.  15.Robaxin 500 mg p.o. q.6h p.r.n. spasm.  16.Tylenol with Codeine #3 one tablet or two every 4-6 hours p.r.n.      pain.  17.The patient also is allowed Percocet 5/325 1 tablet p.o. q.4h to 6h      p.r.n. pain.  18.Dulcolax orally 3-4 tablets p.o. daily p.r.n. no bowel movement.   STATUS AT THE TIME OF DISCHARGE:  The patient was felt to be stable and  improved.   PLAN:  The patient will  be discharged to the [facility] [place]  nursing home on November 27, 2007 she should be seen back at [facility] and Dr. Vira Browns at 2 weeks following her  discharge and an appointment should be made at phone number [phone].  The patient will be transferred [facility] via ambulance.  Her  weightbearing status is touchdown weightbearing only right lower  extremity using a walker or crutches.  This will be maintained for a  period of 6 weeks from the time of her surgery November 19, 2007.      Kerrin Champagne, M.D.  Electronically Signed     JEN/MEDQ  D:  11/27/2007  T:  11/27/2007  Job:  [id number]

## 2010-12-20 NOTE — Assessment & Plan Note (Signed)
HISTORY OF PRESENT ILLNESS:  [name] is back regarding her back pain.  She  could not afford the Opana due to cough and so we tried methadone.  She  is on 10 mg t.i.d.  She has not had any problems with the methadone from  tolerance standpoint, although she does not notice improvement in her  pain.  She rates her pain at 5-6/10.  She stopped the Keppra.  She ran  out of trazodone and stated that it was not working either for sleep.  The only thing she feels that helps are the Percocet.  She rates her  pain at 5-6/10, describes it as sharp stabbing tingling aching.  Her  Oswestry score is 70%.  Pain interferes with general activity, relations  with others, enjoyment of life on a moderate level.  Pain increases with  bending, sitting, and standing and it usually gets better with rest and  then the Percocet as mentioned above.   REVIEW OF SYSTEMS:  Notable for trouble walking.  Other pertinent  positives are above and full review is in the written health and history  section of the chart.   SOCIAL HISTORY:  The patient is married, living with her husband.  She  is excited about grandchild that she expecting any day now.   PHYSICAL EXAMINATION:  VITAL SIGNS:  Blood pressure 139/88, pulse 104,  respiratory rate 20.  She is satting 95% on room air.  GENERAL:  The patient is pleasant, alert, and oriented x3.  Affect is  generally bright and appropriate.  She remains shortness of breath at  rest.  She is ordering portable oxygen.  HEART:  Tachycardic.  CHEST:  Clear.  ABDOMEN:  Soft, nontender.  She continues to have a kyphotic posture and pain with palpation and  range of motion in the lower lumbar spine.  Strength is generally 5/5  with normal sensory exam today.  Reflexes are 2+.   ASSESSMENT:  1. Chronic low back pain status post lumbar laminectomy.  2. History of spinal stimulator.  3. Epidural fibrosis, L4 through S1.  4. Avascular necrosis and osteoporosis.  5. Severe O2 dependent  chronic obstructive pulmonary disease.  6. Tobacco abuse.   PLAN:  1. We will continue titrating the methadone as she has not had any      tolerance issues as of yet .  We will titrate over 10 days at 20 mg      t.i.d.  2. Continue Percocet 5 for breakthrough pain.  3. We will try Restoril 15 mg nightly for sleep.  4. I will see her back in a month and we will continue working towards      better pain control.  I am starting to wonder if there will be a      regimen that will work for her.      Ranelle Oyster, M.D.  Electronically Signed     ZTS/MedQ  D:  08/03/2008 12:22:23  T:  08/04/2008 07:08:57  Job #:  [id number]   cc:   Kerrin Champagne, M.D.  Fax: [phone]

## 2010-12-20 NOTE — Assessment & Plan Note (Signed)
[name] is back regarding her back and hip pain.  I had a chance to review  her MRIs from last visit at least her older MRIs and MRI from 2003,  showed epidural scarring and involvement of L5-S1 nerve roots  potentially.  Myelogram done in 2005 revealed facet arthropathy.  No  significant disk herniation, but likely some epidural scaring without an  involvement of the nerves.  Her pain remains mostly in her back.  She  has an active stimulator, but does not feel that it helps her much at  this point.  The stimulator is at least 62 years old.  We trailed her on  Avinza 30 mg daily, and she did not have significant benefit with that.  She was unable to tolerate Lidoderm patch due to rash.  She uses  Percocet for breakthrough pain.   Recently, the patient has been suffering from viral illness, and she has  lost some weight and has not eaten much over the last few days and this  has certainly decreased her quality of life.  The patient rates her pain  at 6/10 today, described as aching and constant.  Pain interferes with  general activity, relations with others, enjoyment of life on a moderate-  to-severe level.  Sleep is poor.   REVIEW OF SYSTEMS:  Notable for numbness, trouble walking, depression,  and anxiety.  Full review is written out in history section in the  chart.   SOCIAL HISTORY:  The patient is married and living with her husband.  She is still smoking daily.   PHYSICAL EXAMINATION:  VITAL SIGNS:  Blood pressure is 139/85, pulse is  91, and respiratory rate 24.  She is saturating 93% on room air.  GENERAL:  The patient is pleasant, alert, and oriented x3.  She was  wearing oxygen today, but still is a bit short of breath.  She is  notable for her multiple fibromas all over the body.  Cafe-au-lait spots  were also seen.  Low back remains tender to range, particularly with  flexion and extension today.  Extension may have been more painful for  her.  It is hard to discern really  which movement was specifically  increasing symptoms.  HEART:  Tachycardic.  CHEST:  Clear.  ABDOMEN:  Soft and nontender.  EXTREMITIES:  Lower extremities, remain generally intact, 4+ to 5/5  strength and nonfocal.  Reflexes are 2+ and sensory exam is generally  within normal limits.   ASSESSMENT:  1. Chronic low back pain/lumbar post-laminectomy syndrome.  2. History of spinal stimulator.  3. Questionable epidural fibrosis L4-L5 and L5-S1.  4. History of avascular necrosis and osteoporosis.  5. Severe O2 dependent chronic obstructive pulmonary disease.  6. Persistent tobacco use.   PLAN:  1. We will increase Avinza to 30 mg q.12 hours with Percocet 5/325 for      breakthrough pain.  2. We will initiate Lyrica trail 75 mg nightly, titrating up to t.i.d.      over 2 weeks' time.  3. Given the fact that she has stimulator in place, I do not see it      prudent in trying any type of interventional blocks to her low back      at this point.  She could be having some facet symptoms perhaps.      We will first try to      reduce her pain levels and then encourage back strengthening range      of motion  exercises going forward.  4. I will see her back in about a month.      Ranelle Oyster, M.D.  Electronically Signed     ZTS/MedQ  D:  05/06/2008 11:14:46  T:  05/07/2008 04:44:31  Job #:  [id number]   cc:   Kerrin Champagne, M.D.  Fax: [phone]

## 2010-12-20 NOTE — Assessment & Plan Note (Signed)
Deshanna is back regarding her back and hip pain.  She has had nausea and  vomiting with the increase of Avinza to 60 mg q.12 h.  For some reason,  she neglected to call us about this.  Her pain has been at a 5-6/10.  She does have increase in her pain recently due to the cooler damp  weather.  Pain has been sharp and stabbing.  Pain interferes with  general activity, relations with others, and enjoyment of life on a  moderate level.  Pain is in the low back and right hip most prominently.   REVIEW OF SYSTEMS:  Notable for numbness, tingling, bladder control  problems, depression, anxiety, vomiting, and shortness of breath.  She  continues to wear oxygen and smokes 1 pack of cigarettes per day.   SOCIAL HISTORY:  The patient is married, living with her husband, who  also is smoking.   PHYSICAL EXAMINATION:  VITAL SIGNS:  Blood pressure is 125/71, pulse 72,  respiratory rate 24, and she is sating 85% on 2.5 L of oxygen.  GENERAL:  She is generally short of breath at rest.  She is able to  speak in complete sentences.  HEART:  Regular.  CHEST:  Noticeable for some wheezes.  EXTREMITIES:  Posture is kyphotic.  She has some pain with palpation  over the lower lumbar spine segments.  The pain is increased with  somewhat flexion more so than extension today, although both are  painful.  Strength is 5/5 with intact reflex and sensory exam.   ASSESSMENT:  1. Chronic low back pain/lumbar post-laminectomy syndrome.  2. History of spinal stimulator.  3. Epidural fibrosis L4 through S1.  4. Avascular necrosis and osteoporosis.  5. Severe O2 dependent chronic obstructive pulmonary disease.  6. Tobacco abuse.   PLAN:  1. We will change over her from Avinza 60 q.12 to Opana 10 mg q.12 h.      Continue Percocet 5 for breakthrough pain.  Continue Keppra 500 mg      t.i.d. for now for neuropathic symptoms.  2. The patient was placed on trazodone after she could not afford      Rozerem.  We will  increase trazodone to 100 mg nightly for sleep.      Consider sleep study.  3. I will see her back in a month.      Ranelle Oyster, M.D.  Electronically Signed     ZTS/MedQ  D:  07/06/2008 12:51:08  T:  07/07/2008 03:38:10  Job #:  [id number]   cc:   Kerrin Champagne, M.D.  Fax: [phone]

## 2010-12-20 NOTE — Assessment & Plan Note (Signed)
[facility]                         GASTROENTEROLOGY OFFICE NOTE   NAME:[name], [name] POCHE                      MRN:          [id number]  DATE:03/27/2007                            DOB:          24-Feb-1951    [name] is a very nice 60 year old white female with  neurofibromatosis who is here today because of abnormal x-ray of the  abdomen indicating dilated intrahepatic ducts, as well as dilation of  the pancreatic duct with underlying atrophy of the pancreas.  She also  has mild abnormality of liver function tests, specifically, mild  elevation of alkaline phosphatase and AST.  [name] is complaining  of abdominal pain.  It is in a band-like fashion anterior around the  umbilicus.  It is associated with abdomen distension and tightness.  It  usually relaxes when she lies down.  She also has had some nausea but no  dysphagia.  There is a questionable history of peptic ulcer disease in  the past for which she underwent upper endoscopy.  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  She is followed by Dr. Sherene Sires.  In November 2007, she presented for  laparoscopic cholecystectomy by Dr. Orson Slick with findings of chronic  cholecystitis, cholelithiasis, and dilated common bile duct  intraoperative cholangiogram.  There were no stones in the common bile  duct, and no evidence of pancreatic malignancy.  Several months  following cholecystectomy, she started having some swelling of her  abdomen.  She also has a chronic back pain for which she has implanted  stimulator.  There has been no jaundice, no itching, easy bleeding.  There was a complication during one of her laminectomies by Dr. Otelia Sergeant  with respiratory difficulty.  The patient was respiratory dependent  following general anesthesia.  She was told not to have a general  anesthesia, but she actually tolerated her laparoscopic cholecystectomy  quite well.   Her most recent liver function test showed mild elevation  of alkaline phosphatase to 165 with normal AST of 38 and ALT of  __________.   MEDICATIONS:  1. O2 two liters continuously.  2. Gabapentin 600 mg p.o. b.i.d.  3. Cyclobenzaprine 10 mg p.o. t.i.d.  4. She is also on alprazolam 1 mg p.o. t.i.d. p.r.n.  5. Oxycodone 5 mg p.r.n. back pain.   PAST HISTORY:  Significant for hysterectomy, tubal ligation, and lap  chole.   FAMILY HISTORY:  Positive for heart disease in her mother.   SOCIAL HISTORY:  Married with 3 children.  She is disabled.  She smokes,  but does not drink alcohol.   REVIEW OF SYSTEMS:  Positive for chronic back pain, shortness of breath,  and muscular pains.   PHYSICAL EXAMINATION:  Blood pressure 122/86, pulse 60 and weight 127  pounds.  The patient has gained about 20 pounds in the last several  months.  She had neurofibromatosis.  Sclerae were nonicteric.  Oral cavity was  normal.  NECK:  Supple.  LUNGS:  With decreased breath sounds.  No wheezes or rales, no cough.  COR:  With rapid S1, S2.  ABDOMEN:  Protuberant, but relaxed when she lays down.  It was very soft  and nontender with normoactive bowel sounds.  She had decreased muscular  tone and muscle support of her abdomen.  Post cholecystectomy scars were  well healed.  Liver edge was at costal margin.  RECTAL EXAM:  Normal rectal tone.  Stool was Hemoccult negative.  EXTREMITIES:  No edema.   LABORATORY DATA:  Reviewed from Dr. Lonell Face office.   IMPRESSION:  7. A 60 year old white female with abdominal distension and pain which      may be a combination of chronic obstructive pulmonary disease      causing her diaphragm to drop and cause protuberance of the      abdomen.  It seemed to relax when she lays down or when she takes      tension from her abdomen.  2. Anxiety aggravating her abdominal pain.  3. Abnormal CT scan of the abdomen with abnormal liver function tests      indicating  dilated biliary and pancreatic ducts.  This abnormality      was present during her laparoscopic cholecystectomy in the fall of      2007, and it was noticed by her surgeon, Dr. Orson Slick.  We, of      course, need to rule out retained common bile duct stone or      possibly due to old pancreatic malignancy or ampullary malignancy.      She, unfortunately, has a spinal stimulator which prevents magnetic      resonance imaging, x-rays or magnetic resonance      cholangiopancreatography.  She will, therefore, need an endoscopic      retrograde cholangiopancreatography .   PLAN:  1. Endoscopic retrograde cholangiopancreatography to assess her for      biliary obstruction.  2. Repeat liver function tests and tumor markers including CEA and      CA19-9.  3. Prilosec 20 mg a daily for digestive symptoms and hyper acidity.  4. I have explained at length to the patient that some of her      abdominal distension is likely related to dropped diaphragm causing      rapid crowding of her abdominal visceral organs and protuberance of      the abdomen which protrudes more because of absence of abdominal      muscles.  Anxiety may be contributing, and for this reason we will      give her refill for Xanax and Vicodin.     Hedwig Morton. Juanda Chance, MD  Electronically Signed    DMB/MedQ  DD: 03/27/2007  DT: 03/28/2007  Job #: [id number]   cc:   Kendrick Ranch, M.D.

## 2010-12-20 NOTE — Consult Note (Signed)
[name], [name]               ACCOUNT NO.:  [id number]   MEDICAL RECORD NO.:  [id number]          PATIENT TYPE:  INP   LOCATION:  1233                         FACILITY:  [facility]   PHYSICIAN:  Lonia Blood, M.D.DATE OF BIRTH:  1950/09/01   DATE OF CONSULTATION:  11/20/2007  DATE OF DISCHARGE:                                 CONSULTATION   REFERRING PHYSICIAN:  Kerrin Champagne, M.D.   REASON FOR CONSULTATION:  Respiratory distress.   HISTORY OF PRESENT ILLNESS:  [name] is a 60 year old female  with a complex medical history. This includes a long-standing history of  COPD with ongoing tobacco abuse. She was admitted to the hospital by Dr.  Havery Moros on November 19, 2007 due to a fracture of the right femoral neck. She  has undergone surgical correction of this by Dr. Otelia Sergeant. In the  postoperative period, she has developed rapid onset of shortness of  breath. There has been some expiratory wheezing. Chest x-ray has  revealed evidence of asymmetric bilateral edema. The patient is  requiring a nonbreather. There has been no significant fevers or chills.  There has been some chronic nausea with intermittent vomiting, but this  is a recurrent problem for this patient. There has been no chest pain.  There has been no headache. The patient does have chronic back pain  which she says is reasonably controlled at the present time. She has  only been hospitalized since November 19, 2007, and there have been no  pleuritic chest pain complaints.   REVIEW OF SYSTEMS:  Comprehensive review of systems is unremarkable with  the exception of multiple positive elements noted in the history of  present illness.   PAST MEDICAL HISTORY:  1. Neurofibromatosis.  2. Chronic back pain status post stimulator implantation.  3. History of narcotic and benzodiazepine abuse with prior [facility] admissions for detox.  4. Significant COPD with ongoing tobacco abuse.  5. Depression.  6.  Status post bilateral total ligation.  7. Status post appendectomy in 1974.  8. Status post laparoscopic cholecystectomy.  9. Noted mild diastolic dysfunction by echocardiogram August 2008.   MEDICATIONS:  The patient's home medication list is not clear. The  patient herself cannot provide a reliable history. It does not appear  that she is on chronic inhalers or any preventative therapy in regards  to her COPD.   ALLERGIES:  PENICILLIN AND LATEX ARE REPORTED AS ALLERGIES.   FAMILY HISTORY:  Is reviewed, and the patient's old record is  noncontributory to this current problem.   SOCIAL HISTORY:  The patient is married. She is disabled due to her back  pain. She is presently unemployed.   DATA REVIEWED:  BNP 302. Sed rate is essentially normal. BMET is  unremarkable with the exception of mildly elevated serum glucose at 135.  Hemoglobin is 13.7 with a white count of 10 and a platelet count of 294.  INR is normal. LFTs are normal. Chest x-ray reveals bilateral air space  disease, right greater than left, most consistent with edema which is  new  since November 19, 2007.   PHYSICAL EXAMINATION:  Temperature 98.1, blood pressure 126/78,  respiratory rate 15, O2 saturation is 96% on 100% nonrebreather.  GENERAL:  Thin, frail-appearing female in moderate respiratory distress  on 100% nonbreather but able to carry a conversation.  HEENT:  Normocephalic, atraumatic. Pupils are equal, round, and reactive  to light and accommodation, though sluggish.  NECK:  JVD halfway to the angle of the jaw at 30 degrees.  LUNGS:  Diffuse mild expiratory wheezing with poor air movement in all  fields and bibasilar crackles appreciable.  CARDIOVASCULAR:  Distant but regular without gallop or rub or  appreciable murmur.  ABDOMEN:  Mildly distended, soft, bowel sounds are hypoactive. There is  no appreciable mass. She is nontender. There is no rebound.  EXTREMITIES:  Trace bilateral lower extremity edema  without cyanosis or  clubbing with 2+ dorsalis pedis pulses bilaterally.  NEUROLOGICAL:  Alert and oriented x4. Cranial nerves II-XII intact  bilaterally. Spontaneously moves all four extremities.   RECOMMENDATIONS:  1. Acute-on-chronic respiratory failure - this appears to be acute-on-      chronic hypoxic respiratory failure - this is most likely primarily      secondary to pulmonary edema. This is probably a simple      complication of her diastolic dysfunction in the setting of recent      pain, accelerating her hypertension. There is also the possibility      of mild volume overload in the perioperative period. This is      further complicated by the fact that the patient has a baseline of      significant COPD. There is some mild wheezing at the present time.      Given her recent surgery, I do not wish her to place her steroids      unless pushed to do so. I will accelerate her beta blocker therapy      and also use her beta agonist therapy as well as her Atrovent, and      I will place her on inhaled steroids. We will diurese her gently      and follow her I&Os closely. Follow-up chest x-ray will be      accomplished in the morning as well as a follow-up ABG. We will      attempt to wean her from the face mask at such time improvement in      oxygenation is noted.  2. Ongoing tobacco abuse. I have counseled the patient on absolute      need to discontinue smoking. I have counseled her as to the direct      connection between her current difficulties and tobacco abuse.  3. Neurofibromatosis. No acute complicating factors at the present      time.  4. Hyperglycemia. We will check a hemoglobin A1c in the morning. If it      is significantly elevated, we will begin to check CBGs on a routine      basis.  5. Prophylaxis. Orthopedics is dosing the patient with Coumadin, and I      agree with his current use with no significant changes.   Thank you for your consultation on this  patient. We will be happy to  continue to follow along with you.      Lonia Blood, M.D.  Electronically Signed     JTM/MEDQ  D:  11/20/2007  T:  11/20/2007  Job:  [id number]

## 2010-12-20 NOTE — Assessment & Plan Note (Signed)
OFFICE VISIT   [name], [name]  DOB:  Jul 16, 1951                                        December 01, 2009  CHART #:  [id number]   The patient returned today and the valve has been removed.  She is doing  well overall.  There is no evidence of recurrence of her air leak.  We  plan to see her back again if she has any future problems.   Ines Bloomer, M.D.  Electronically Signed   DPB/MEDQ  D:  12/01/2009  T:  12/02/2009  Job:  [id number]

## 2010-12-21 ENCOUNTER — Inpatient Hospital Stay (HOSPITAL_COMMUNITY): Payer: Medicare Other

## 2010-12-23 ENCOUNTER — Encounter: Payer: Self-pay | Admitting: *Deleted

## 2010-12-23 NOTE — H&P (Signed)
[name], [name]               ACCOUNT NO.:  [id number]   MEDICAL RECORD NO.:  [id number]          PATIENT TYPE:  INP   LOCATION:  1847                         FACILITY:  [facility]   PHYSICIAN:  Lonia Blood, M.D.DATE OF BIRTH:  Sep 18, 1950   DATE OF ADMISSION:  11/21/2006  DATE OF DISCHARGE:                              HISTORY & PHYSICAL   PRIMARY CARE PHYSICIAN:  Unassigned.   ORTHOPEDIST:  Caralyn Guile. Ramos, M.D.   CHIEF COMPLAINT:  Found unresponsive with admitted narcotic overdose.   HISTORY OF PRESENT ILLNESS:  [name] is a 60 year old  female with a history of neurofibromatosis and severe chronic back pain,  who is status post multiple low back surgeries.  She has a history of  narcotic and benzodiazepine abuse and has been admitted to [facility] on two prior occasions voluntarily for detox for these  substances.  She suffers from chronic pain and has a very high tolerance  for narcotics, per her report.  She has had difficulty controlling her  pain of late.  She was taking what she felt was a reasonable amount of  narcotics to control my pain.  Her symptoms did not improve.  She  therefore increased her dose on her own.  She apparently was found down  at home today by family members, and they were unable to arouse her.  As  a result, patient was brought emergently to [facility] emergency room.  After dosing with Narcan, the patient became awake and alert.  At the  present time, she is confused, but she is interactive and conversant.  She is able to tell me where she is and provides some limited history.  The patient vehemently denies any suicidal ideation or intention.  She  reports that she has everything to live for, as she has a new  grandson.  She states that she just doesn't want to be in pain.   REVIEW OF SYSTEMS:  Patient reports that she is having chronic low back  pain.  This is not new.  This is her usual pain.  She denies chest  pain,  fever, chills, nausea or vomiting.  Her history is of questionable  reliability; however, she is not clear what exactly the year is.   PAST MEDICAL HISTORY:  Per old records.  1. COPD with ongoing tobacco abuse.  2. Chronic back pain, status post multiple back surgeries.  3. Neurofibromatosis.  4. Status post cholecystectomy.  5. Tobacco abuse.  6. History of opiate abuse with prior admissions to [facility].  7. History of benzodiazepine abuse with prior admissions to [facility].  8. Depression.  9. Status post bilateral tubal ligation.  10.Status post appendectomy in 1974.   OUTPATIENT MEDICATIONS:  Doses unavailable at the present time.  1. Norco.  2. Ativan.  3. Morphine sulfate.  4. Oxycodone.  5. Remeron.  6. Restoril.  7. Xanax.   ALLERGIES:  PENICILLIN, LATEX, per old records.   FAMILY HISTORY:  There is apparently a remote history  of coronary artery  disease and lung cancer but no family history that is directly  contributory to this admission.   SOCIAL HISTORY:  The patient is married.  She is disabled.  She is  unemployed.   DATA REVIEW:  CBC is normal.  Comprehensive metabolic panel is normal.  Alcohol level is less than 5.  D-dimer is normal.   Chest x-ray reveals chronic interstitial disease with superimposed  atelectasis versus infiltrate in the right base.   PHYSICAL EXAMINATION:  VITAL SIGNS:  Temperature 97.9, blood pressure  106/59, heart rate 106, respiratory rate 18, O2 sats 91% on 10 liters  per minute nasal cannula.  GENERAL:  A thin, frail-appearing female with diffuse cutaneous  neurofibromas consistent with her neurofibromatosis, who is in no acute  respiratory distress.  HEENT:  Normocephalic and atraumatic.  Pupils are equal and round, then  reactive to light and accommodation but they are sluggish; however, they  are not pinpoint.  Oral cavity is dry but otherwise unremarkable.  Patient is  edentulous.  NECK:  No JVD.  No lymphadenopathy.  LUNGS:  Clear to auscultation bilaterally without wheezes or rhonchi.  CARDIOVASCULAR:  Regular rate and rhythm without murmur, rub or gallop.  Normal S1 and S2.  ABDOMEN:  Nondistended, nontender.  Soft.  Bowel sounds present.  No  hepatosplenomegaly.  No rebound.  No ascites.  EXTREMITIES:  No significant clubbing, cyanosis or edema in bilateral  lower extremities.  NEUROLOGIC:  Patient is alert and oriented to person and place.  She is  oriented to the month and the day but not the year.  She is slightly  confused and is slow in responding to questions.  She moves all four  extremities spontaneously.  She displays 4/5 strength in the bilateral  upper and lower extremities.  There is no Babinski's.  Cranial nerves II-  XII are intact bilaterally.   IMPRESSION/PLAN:  1. Unintentional narcotic overdose:  [name] does have a history      of depression; however, she does not have a flat affect at the      present time and does not appear to be in melancholic depression.      She explains to me that she has every reason to live.  She states      that she simply has real bad pain and wanted to take care of it.      She states that she is comfortable with the amount of narcotics I      took.  I have explained to her that the amount of narcotics she      took almost ended her life.  She appeared upset about this and      again states, I don't want to die.  I therefore do not feel that      this was actually a suicidal attempt.  I do feel this was likely an      accidental overdose prompted by pain and an unclear medication      regimen.  The patient's orthopedic office is not open at the      present time, and therefore I am not able to clarify the      medications that she takes.  No family is present.  No medication     list is available.  We will need to evaluate the patient's home      regimen prior to her discharge.  I will request  substance abuse  counseling and assistance in obtaining the appropriate treatment      for the patient.  It is quite possible that a readmission to      [facility] would be warranted.  For now, we will      cover the patient empirically with clonidine, and we will follow      her closely in the step-down unit.  2. Right lower lobe infiltrate:  This likely represents an aspiration      pneumonitis.  At the present time, the patient does not have      evidence of a true pneumonia.  We will follow her clinically.      Should she develop a fever or a hypoxia, we will need to initiate      antibiotic coverage.  3. Chronic back pain:  For now, we will hold all analgesics with the      exception of Tylenol for obvious reasons.  Should the patient's      pain be poorly controlled, we will consider adding Toradol to her      regimen temporarily.  4. Tobacco abuse:  Tobacco cessation.  Consultation will be requested.      A nicotine patch will be provided.      Lonia Blood, M.D.  Electronically Signed     JTM/MEDQ  D:  11/21/2006  T:  11/21/2006  Job:  [id number]   cc:   Caralyn Guile. Ethelene Hal, M.D.

## 2010-12-23 NOTE — H&P (Signed)
[name], [name]               ACCOUNT NO.:  [id number]   MEDICAL RECORD NO.:  [id number]          PATIENT TYPE:  IPS   LOCATION:  0501                          FACILITY:  [facility]   PHYSICIAN:  Geoffery Lyons, M.D.      DATE OF BIRTH:  12-Oct-1950   DATE OF ADMISSION:  07/03/2005  DATE OF DISCHARGE:                         PSYCHIATRIC ADMISSION ASSESSMENT   IDENTIFYING INFORMATION:  This is a 60 year old white female who is married.  This is a voluntary admission.   HISTORY OF PRESENT ILLNESS:  This 60 year old, with a history of chronic  back pain and depression, endorses about 6 weeks of worsening depression.  She has not been on any medications for her depression in quite some time.  She says that she has had symptoms, including one week of feeling  frustrated, agitated.  She feels exhausted by her chronic back pain, but is  sick of taking medications for the pain and wants to be off of her opiates.  She has lost about 20 pounds in the past 4 weeks.  Says that she is  anorexic, anhedonic, and has had increased tearfulness.  She feels disgusted  with herself.  She is not interested in doing her household decorating as  she usually does for the holidays.  Sleeping only about 4 or 5 hours per  night.  Feels that the medications are not helping her pain, so she stopped  both her pain medications and her Xanax about 3 days ago, and then became  tearful and agitated.  Yesterday, was sitting at the kitchen table, beating  her fists on her thighs.  Her husband became worried about her and brought  her to the hospital.  Today, she denies any suicidal thought, but admits to  depressed mood, and is quite determined to want to be off of her pain  medications.   PAST PSYCHIATRIC HISTORY:  The patient has a history of depression with a  prior admission here June 28 through February 07, 2004.  At that time, was  abusing her opiates, taking her husband's OxyContin when she ran out of her  own.  She felt  depressed and at that time had passed out in the laundry  room.  Had some similar symptoms, but denies that she is abusing any  medications this time.  She was referred to her current psychiatrist, Dr.  Milford Cage, by Dr. Ethelene Hal, her orthopedist, who was concerned about her  level of depression and how it was impacting her pain.  The patient reports  that after discharge from [facility] previously, she did  well on the Cymbalta and the Neurontin, but was unable to pay for the  Neurontin, and has also been unable to pay for Klonopin which has been  prescribed for her in the past.  She denies any mania.  Denies any current  substance abuse.   SOCIAL HISTORY:  This is a married white female who worked 20 years for  [employer].  Is currently at home now, unable to work because of her  history of emphysema, and also chronic back pain.  She is living with her  husband, who is supportive of her.  Stable family.  She does endorse some  loneliness and misses being able to work.  She was actually working 2 jobs  or more for 20 years and was used to being very busy.   FAMILY HISTORY:  Remarkable for father with a history of alcoholism and  mother with a history of depression.   ALCOHOL AND DRUG HISTORY:  The patient does have a past history of some  opiate abuse, as previously noted.   PAST MEDICAL HISTORY:  The patient is followed by Dr. Ethelene Hal with whom she  has a pain management agreement and contract, and she states that she has  been keeping this.  The patient is followed by [facility] for her  emphysema and neurofibromatosis.   MEDICATIONS:  Medications are obtained at [facility] on [facility].  Medications include:  1.  Oxycodone 30 mg approximately t.i.d. p.r.n. for chronic pain.  This is      an immediate release.  2.  Albuterol nebulizer treatments b.i.d.  3.  Ambien CR 12.5 mg at h.s.  4.  Xanax 1 mg.  It is prescribed t.i.d., but she has  been using it 1 mg in      the morning and 1 mg at bedtime, which controls her anxiety symptoms.   REVIEW OF SYSTEMS:  GENERAL:  She is a thin, frail female and 12-point  review of systems today is remarkable for general anxiety.  She has not been  taking her Xanax for a couple of days.  She is slightly tremulous.  No loose  stools, no hematemesis, no bleeding from bowels or bladder, no dysuria.  She  has had some considerable insomnia, sleeping only about 3 or 4 hours a  night, but sleep is much better if she takes her Ambien CR with her Xanax at  bedtime.  No fever, cough or chest pain.   POSITIVE PHYSICAL FINDINGS:   PHYSICAL EXAMINATION:  GENERAL:  This is a frail-appearing, thin, small-  built female.  VITAL SIGNS:  On admission to the unit, 5 feet 4 inches tall.  She weighs  102 pounds.  Temperature 96.6, pulse 112, respirations 20, blood pressure  141/94.  Her O2 saturation was at 98% with oxygen at 2 liters per minute.  Physical examination:  Thin, frail, small female.  HEAD:  Normocephalic and atraumatic.  She is frail.  EENT:  Extraocular movements are normal.  Sclerae are nonicteric.  No  rhinorrhea.  She is wearing her oxygen by nasal cannula 2 liters per minute.  NECK:  Supple, no thyromegaly.  CHEST:  Thin, frail, clear to auscultation.  BREAST EXAM:  Deferred.  CARDIOVASCULAR:  S1 and S2 is heard, no clicks, murmurs or gallops.  Apical  pulse is synchronous with radial pulse.  ABDOMEN:  Flat, soft, nontender, nondistended.  GENITOURINARY:  Deferred.  EXTREMITIES:  No clubbing, no cyanosis, no peripheral edema.  SKIN:  Marked by multiple scattered nodules all over her face and  extremities, consistent with history of neurofibromatosis.  NEUROLOGIC:  Cranial nerves II-XII intact, extraocular movements are normal.  The patient has strength 5/5 in all extremities and cerebellar functions are  intact.  Neuro is nonfocal.  DIAGNOSTIC STUDIES:  WBC 9.1, hemoglobin 16.6,  hematocrit 49.9, platelets  334,000.  Sodium 132, potassium 3.8, chloride 97, carbon dioxide 23, BUN 22,  creatinine 0.9, glucose 56.  SGOT 16, SGPT 14, alkaline phosphatase 97.  Total  bilirubin 0.8.  Alcohol level was less than 5.  Urine drug screen  positive for benzodiazepines, and negative for opiates and all other  substances.  TSH is pending.  Her urinalysis was positive for 100 mg of  protein per deciliter, but no other remarkable findings.   MENTAL STATUS EXAM:  Fully alert, frail female, appears anxious, fine motor  tremor, cooperative.  Appears to be in detox from benzodiazepines.  Pleasant, cooperative.  Insight is adequate, speech is normal in pace, tone  and amount.  Mood is depressed and anxious, somewhat irritable with her back  pain.  Is very clear about wanting to be off of her opiate medications and  stay off of them.  Passive suicidal ideation, feeling worthless, somewhat  disgusted with herself.  No interest in holiday decorating or any  activities.  No flight of ideas, no paranoia, no auditory or visual  hallucinations, no homicidal thoughts.  Cognitively she is intact and  oriented x3.  Insight good, impulse control and judgment within normal  limits.  Intellect within normal limits.   ADMISSION DIAGNOSIS:  AXIS I:  Depressive disorder not otherwise specified,  opiate dependence.  AXIS II:  Deferred.  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  AXIS IV:  Moderate to severe, stress from chronic pain.  AXIS V:  Current 28, past year 67.   INITIAL PLAN OF CARE:  Plan is to voluntarily admit the patient with q.15  minute checks in place.  We will go ahead and detox her from the opiates but  will restart her on her Xanax 1 mg b.i.d. according to her normal household  routine.  We are going to start her back on 30 mg of Cymbalta daily since  this did work before, and she does remember having a positive response to   it.  Plan on having her follow up with Dr. Milford Cage.  Meanwhile, we  will also continue her Ambien 12.5 mg.  She has Tylenol available for her  back pain.  We will restart her on Naprosyn 500 mg p.o. q.a.m. after  breakfast and 6 p.m. after supper.  We are going to continue her oxygen at 2  liters per minute and will also offer her Ensure 1 can q.i.d.  She has not  been eating regularly and to this she attributes her low blood sugar on  admission.  We are also going to get a nutritional consult for a 20-pound  weight loss in 30 days.   ESTIMATED LENGTH OF STAY:  5 days.      Margaret A. Scott, N.P.      Geoffery Lyons, M.D.  Electronically Signed    MAS/MEDQ  D:  07/04/2005  T:  07/04/2005  Job:  [id number]

## 2010-12-30 ENCOUNTER — Encounter: Payer: Self-pay | Admitting: Internal Medicine

## 2010-12-30 ENCOUNTER — Ambulatory Visit: Payer: Self-pay | Admitting: Internal Medicine

## 2010-12-30 ENCOUNTER — Ambulatory Visit (INDEPENDENT_AMBULATORY_CARE_PROVIDER_SITE_OTHER): Payer: Medicare Other | Admitting: Internal Medicine

## 2010-12-30 VITALS — BP 144/80 | Ht 63.0 in | Wt 116.0 lb

## 2010-12-30 DIAGNOSIS — F411 Generalized anxiety disorder: Secondary | ICD-10-CM

## 2010-12-30 DIAGNOSIS — J449 Chronic obstructive pulmonary disease, unspecified: Secondary | ICD-10-CM

## 2010-12-30 DIAGNOSIS — F17200 Nicotine dependence, unspecified, uncomplicated: Secondary | ICD-10-CM

## 2010-12-30 DIAGNOSIS — C3490 Malignant neoplasm of unspecified part of unspecified bronchus or lung: Secondary | ICD-10-CM

## 2010-12-30 MED ORDER — ZALEPLON 10 MG PO CAPS: 10.0000 mg | ORAL_CAPSULE | Freq: Every day | ORAL | Status: DC

## 2010-12-30 NOTE — Assessment & Plan Note (Addendum)
Adenocarcinoma LUL per needle bx done at [facility]. She wants treatment here. Based on recommendations from Thoracic Radiology Conference, as discussed with her, we will refer to Radiation Oncology through the [facility].

## 2010-12-30 NOTE — Progress Notes (Signed)
Subjective:    Patient ID: [name], female    DOB: 30-Jan-1951, 60 y.o.   MRN: [id number]  HPI   Subjective:    Patient ID: [name], female    DOB: 1950/12/06, 60 y.o.   MRN: [id number]  HPI  12/09/10- 69 yoF smoker followed for COPD, tobacco abuse and lung nodule, still smoking. Complicating hx of extensive neurofibromatosis, depression, osteopenia. Last here August 11, 2010 after hosp twice for exacerbations of COPD, unable to afford meds. Oxygen dependent. During hosp CXR showed 1.4x1.0 LUL nodule.  Since last here, she had exacerbation with dyspnea while visiting sister in [place]. They admitted her to [facility] again 2-3 weeks ago- breathing better, but upped O2 to 5 L. They did needle bx of the lung nodule and told her POS for CANCER. She knows no more than that with no f/u planned at [facility].We are requesting records. She wants care here where she lives w/ husband. Very anxious- asks help for nerves- has taken xanax 1 mg in past w/o oversedation. Feels ok. Denies cough, phlegm, blood, pain or swelling. Easy DOE across room. Feet not swelling.   12/30/10- Adenoca LUL, COPD, tobacco, CAD, neurofibromatosis, meningioma Since last here, cytopath from [facility] confirmed needle lung bx adenoca. PET 13 suv w/ no mets. CT brain showed stable meningioma. We reviewed discussion at Thoracic Radiology Conference where recognition that this lesion is on major fissure means too much lung resection for her COPD. Instead we will refer for XRT. She is trying to quit smoking. Denies chest pain. Productive cough with light brownish sputum is routine for her. Denies blood, fever, nodes or breast nodule.  Asks help for sleep, saying that xanax isn't helping.    Review of Systems See HPI Constitutional:   No weight loss, night sweats, fevers, chills, fatigue, lassitude. HEENT:   No headaches,  Difficulty swallowing,  Tooth/dental problems,  Sore throat,                No sneezing, itching, ear ache, nasal  congestion, post nasal drip,   CV:  No chest pain,  Orthopnea, PND, swelling in lower extremities, anasarca, dizziness, palpitations  GI  No heartburn, indigestion, abdominal pain, nausea, vomiting, diarrhea, change in bowel habits, loss of appetite  Resp:.  No coughing up of blood.  No change in color of mucus.  No wheezing.   Skin: She had scratched off another irritated fibroma on her abdomen  GU: no dysuria, change in color of urine, no urgency or frequency.  No flank pain.  MS:  No joint pain or swelling.  No decreased range of motion.  No back pain.  Psych:  No change in mood or affect. No memory loss. Very anxious.      Objective:   Physical Exam General- Alert, Oriented, Affect-appropriate, Distress- none acute but anxious.     O2 portable on 4 L   Skin- Innumerable neurofibromas, with cafe au lait spots on back.   Lymphadenopathy- none  Head- atraumatic  Eyes- Gross vision intact, PERRLA, conjunctivae clear secretions  Ears- Normal-  Hearing, canals, Tm - normal  Nose- Clear, no- Septal dev, mucus, polyps, erosion, perforation   Throat- Mallampati II , mucosa clear , drainage- none, tonsils- atrophic          Edentulous  Neck- flexible , trachea midline, no stridor , thyroid nl, carotid no bruit  Chest - symmetrical excursion , unlabored     Heart/CV- RRR , no murmur , no gallop  ,  no rub, nl s1 s2                     - JVD- none , edema- none, stasis changes- none, varices- none     Lung- Crackles, diminished,      wheeze- none, cough- none , dullness-none, rub- none     Chest wall-   Abd- tender-no, distended-no, bowel sounds-present, HSM- no  Br/ Gen/ Rectal- Not done, not indicated  Extrem- cyanosis- none, clubbing, none, atrophy- none, strength- nl  Neuro- grossly intact to observation      Assessment & Plan:    Review of Systems     Objective:   Physical Exam        Assessment & Plan:

## 2010-12-30 NOTE — Assessment & Plan Note (Signed)
Severe COPD with chronic hypoxic respiratory failure. Controlled.

## 2010-12-30 NOTE — Patient Instructions (Signed)
Referral - to [facility]- Radiation Oncology consultation - adenocarcinoma of lung, discussed with Dr Michell Heinrich. Needle biopsy had been done at [facility].   Script for sleep medicine to try instead of xanax for sleep.

## 2010-12-30 NOTE — Assessment & Plan Note (Signed)
I again discussed cessation with her.

## 2010-12-30 NOTE — Assessment & Plan Note (Addendum)
Xanax not sufficient. I will let her try zaleplon.

## 2010-12-31 NOTE — Discharge Summary (Signed)
[name], [name]               ACCOUNT NO.:  [id number]  MEDICAL RECORD NO.:  [id number]           PATIENT TYPE:  I  LOCATION:  3016                         FACILITY:  [facility]  PHYSICIAN:  Lonia Blood, M.D.       DATE OF BIRTH:  [birth year]  DATE OF ADMISSION:  12/19/2010 DATE OF DISCHARGE:  12/22/2010                              DISCHARGE SUMMARY   PRIMARY CARE PHYSICIAN:  Vandana P. Chowbey, MD  DISCHARGE DIAGNOSES: 1. A brief episode of delirium of unknown etiology, resolved. 2. 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 3. New diagnosis of lung cancer - the patient does not want to return     to [facility], and she wants a     new referral to [facility] - this has been taking     care of by myself. 4. Chronic obstructive pulmonary disease. 5. Chronic anxiety and depression. 6. Neurofibromatosis. 7. Hypertension. 8. Chronic tobacco use.  DISCHARGE MEDICATIONS: 1. Advil 200 mg by mouth every 8 hours as needed for pain. 2. Xanax 0.5 mg 3 times a day as needed for anxiety. 3. Plato 100 mg twice a day. 4. Colace 100 mg 1-3 capsules daily. 5. Doxepin 75 mg at bedtime. 6. Hydroxyzine 25 mg twice a day as needed for itching. 7. Lyrica 75 mg twice a day. 8. Nicotine patch 21 mg daily. 9. Nortriptyline 100 mg at bedtime. 10.OxyContin 10 mg every 12 hours. 11.Paxil 20 mg daily. 12.Percocet 1 tablet every 6 hours needed for pain.  CONDITION ON DISCHARGE:  [name] was discharged in good condition, alert, oriented, in no acute distress.  She will follow up at the [facility] with Dr. Si Gaul.  She was also told to follow up with Dr. Providence Crosby at [facility].  PROCEDURE DURING THIS ADMISSION: 1. Dec 20, 2010, the patient underwent head CT without contrast, which     shows no acute or metastatic abnormality, chronic right parietal     meningioma. 2. Dec 21, 2010, chest x-ray  showing chronic lung disease with     fibrosis bilaterally, left upper lobe mass.  HISTORY AND PHYSICAL:  Refer to the dictated H and P done Dr. Betti Cruz.  HOSPITAL COURSE:  [name] is a 60 year old woman with multiple medical problems, chronic respiratory failure was admitted from the emergency room with complaints of some altered mental status.  Initial etiology was unclear.  HOSPITAL COURSE:  [name] was admitted to the hospital with altered mental status.  This was followed by a period of confusion.  Etiology was unclear, but there was possibility of benzodiazepine overdose.  As soon as the patient was out of the intensive care unit, a thorough interview was conducted with the patient.  She actually informed me that she was on chronic alprazolam for years, but as her primary care physician left the practice, she ran out of her medication.  This prompted her to seek non-FDA approved benzodiazepines and that is probably why she ran into problems.  She was under tremendous amount of stress and anxiety due  to the new diagnosis of lung cancer.  The patient has been observed in the hospital for 2 days without any signs of recurrent delirium, hemodynamic instability, or further decompensation. We felt that if she were to wear her 4 liters of oxygen 24 hours a day as prescribed she should be without any problems with confusion.  We resumed her chronic benzodiazepine in the form of alprazolam 0.5 mg every hours as needed and chronic narcotic pain medication in the form of OxyContin 10 mg every 12 hours.  This did not resolve in the patient's increased confusion or recurrence of her confusion episode. As Ms. Ferner was hemodynamically stable without any further needs for hospitalization, we discharged her home with followup at the [facility] and with her new primary care physician, Dr. Providence Crosby.     Lonia Blood, M.D.     SL/MEDQ  D:  12/22/2010  T:   12/23/2010  Job:  [id number]  cc:   Bennett Scrape, MD Lajuana Matte, M.D.  Electronically Signed by Lonia Blood M.D. on 12/31/2010 08:17:22 AM

## 2011-01-02 ENCOUNTER — Encounter: Payer: Self-pay | Admitting: Internal Medicine

## 2011-01-04 ENCOUNTER — Observation Stay (HOSPITAL_COMMUNITY)
Admission: EM | Admit: 2011-01-04 | Discharge: 2011-01-07 | Disposition: A | Discharge status: Home / Self Care | Payer: Medicare Other | Attending: Internal Medicine | Admitting: Internal Medicine

## 2011-01-04 ENCOUNTER — Emergency Department (HOSPITAL_COMMUNITY): Payer: Medicare Other

## 2011-01-04 DIAGNOSIS — W19XXXA Unspecified fall, initial encounter: Secondary | ICD-10-CM | POA: Insufficient documentation

## 2011-01-04 DIAGNOSIS — C3490 Malignant neoplasm of unspecified part of unspecified bronchus or lung: Secondary | ICD-10-CM | POA: Insufficient documentation

## 2011-01-04 DIAGNOSIS — F411 Generalized anxiety disorder: Secondary | ICD-10-CM | POA: Insufficient documentation

## 2011-01-04 DIAGNOSIS — J449 Chronic obstructive pulmonary disease, unspecified: Secondary | ICD-10-CM | POA: Insufficient documentation

## 2011-01-04 DIAGNOSIS — F3289 Other specified depressive episodes: Secondary | ICD-10-CM | POA: Insufficient documentation

## 2011-01-04 DIAGNOSIS — Z9981 Dependence on supplemental oxygen: Secondary | ICD-10-CM | POA: Insufficient documentation

## 2011-01-04 DIAGNOSIS — Q8500 Neurofibromatosis, unspecified: Secondary | ICD-10-CM | POA: Insufficient documentation

## 2011-01-04 DIAGNOSIS — M545 Low back pain, unspecified: Principal | ICD-10-CM | POA: Insufficient documentation

## 2011-01-04 DIAGNOSIS — Y92009 Unspecified place in unspecified non-institutional (private) residence as the place of occurrence of the external cause: Secondary | ICD-10-CM | POA: Insufficient documentation

## 2011-01-04 DIAGNOSIS — J9610 Chronic respiratory failure, unspecified whether with hypoxia or hypercapnia: Secondary | ICD-10-CM | POA: Insufficient documentation

## 2011-01-04 DIAGNOSIS — R262 Difficulty in walking, not elsewhere classified: Secondary | ICD-10-CM | POA: Insufficient documentation

## 2011-01-04 DIAGNOSIS — J8410 Pulmonary fibrosis, unspecified: Secondary | ICD-10-CM | POA: Insufficient documentation

## 2011-01-04 DIAGNOSIS — F17200 Nicotine dependence, unspecified, uncomplicated: Secondary | ICD-10-CM | POA: Insufficient documentation

## 2011-01-04 DIAGNOSIS — F329 Major depressive disorder, single episode, unspecified: Secondary | ICD-10-CM | POA: Insufficient documentation

## 2011-01-04 DIAGNOSIS — G8929 Other chronic pain: Secondary | ICD-10-CM | POA: Insufficient documentation

## 2011-01-04 DIAGNOSIS — J4489 Other specified chronic obstructive pulmonary disease: Secondary | ICD-10-CM | POA: Insufficient documentation

## 2011-01-04 DIAGNOSIS — Z79899 Other long term (current) drug therapy: Secondary | ICD-10-CM | POA: Insufficient documentation

## 2011-01-04 LAB — BASIC METABOLIC PANEL
CO2: 28 mEq/L (ref 19–32)
Calcium: 9.2 mg/dL (ref 8.4–10.5)
Creatinine, Ser: 0.81 mg/dL (ref 0.4–1.2)
GFR calc Af Amer: >60 mL/min (ref >60)
GFR calc non Af Amer: >60 mL/min (ref >60)
Sodium: 142 mEq/L (ref 135–145)

## 2011-01-04 LAB — URINALYSIS, ROUTINE W REFLEX MICROSCOPIC
Nitrite: NEGATIVE
Protein, ur: NEGATIVE mg/dL
Specific Gravity, Urine: 1.023 (ref 1.005–1.030)
Urobilinogen, UA: 0.2 mg/dL (ref 0.0–1.0)

## 2011-01-04 LAB — CBC
MCHC: 33.5 g/dL (ref 30.0–36.0)
Platelets: 269 10*3/uL (ref 150–400)
RDW: 15.7 % — ABNORMAL HIGH (ref 11.5–15.5)
WBC: 9.4 10*3/uL (ref 4.0–10.5)

## 2011-01-04 NOTE — H&P (Signed)
[name], HIGUCHI               ACCOUNT NO.:  [id number]  MEDICAL RECORD NO.:  [id number]           PATIENT TYPE:  E  LOCATION:  [facility]                         FACILITY:  [facility]  PHYSICIAN:  Talmage Nap, MD  DATE OF BIRTH:  August 03, 1951  DATE OF ADMISSION:  01/04/2011 DATE OF DISCHARGE:                             HISTORY & PHYSICAL   PRIMARY CARE PHYSICIAN:  Medical laboratory scientific officer.  History obtainable from the patient.  CHIEF COMPLAINT:  Back pain of and inability to ambulate of 2 days' duration.  HISTORY OF PRESENT ILLNESS:  The patient is a 60 year old Caucasian female with history of neurofibromatosis, multiple lower back surgery, ambulates with the help of a cane was said to have fallen on her buttocks 2 days ago and thereafter was having extreme difficulty in ambulating with a cane.  She also complained about intense pain in the lower back and difficulty to move from a sitting position to the bathroom to either urinate or defecate.  She denied any associated urinary or fecal incontinence.  She denied any history of fever.  She denied any chills.  She denied any rigor, but the pain in the lower back was said to be getting progressively worse with difficulty in ambulation and subsequently came to the emergency room to be evaluated.  PAST MEDICAL HISTORY:  Positive for, 1. Anxiety disorder. 2. COPD. 3. Depression. 4. Recently diagnosed lung CA. 5. Neurofibromatosis.  PAST SURGICAL HISTORY: 1. Multiple lower back surgery x3. 2. Hysterectomy. 3. Laparoscopic cholecystectomy.  MEDICATIONS:  Her Preadmission meds include, 1. Percocet (oxycodone/acetaminophen) 5/325 one p.o. q.6 p.r.n. 2. Paroxetine 20 mg one p.o. q.a.m. 3. Lyrica (pregabalin) 75 mg one cap p.o. b.i.d. 4. Oxycodone 10 mg SR 2 tablets p.o. q. 12 p.r.n. 5. Hydroxyzine 25 mg one p.o. daily. 6. Doxepin 75 mg one p.o. at bedtime. 7. Docusate 100 mg one p.o. daily. 8. Cilostazol 100 mg one p.o. b.i.d. 9.  Alprazolam 0.5 mg p.o. t.i.d.  ALLERGIES:  TO PENICILLIN.  SOCIAL HISTORY:  Positive for chronic tobacco use.  Smokes about a pack of cigarettes per day for the past 40 years.  Denies any history of alcohol use and she is currently on disability.  FAMILY HISTORY:  Positive for neurofibromatosis.  REVIEW OF SYSTEMS:  The patient denies any headaches.  No blurred vision.  No nausea or vomiting.  No fever.  No chills.  No rigor.  No chest pain.  No shortness of breath.  Occasional cough that is nonproductive of sputum.  Denies any PND or orthopnea.  No abdominal discomfort.  No diarrhea or hematochezia.  No dysuria or hematuria. Complained of pain in the lower back with difficulty in ambulation.  No intolerance to heat or cold.  No neuropsychiatric disorder.  PHYSICAL EXAMINATION:  GENERAL:  Elderly lady with multiple nodules on the face, in the arm, as well as lower extremity in mild painful distress, not in any acute respiratory distress at present. VITAL SIGNS:  Blood pressure 122/64, pulse is 103, respiratory rate is 20, temperature is 98.2. HEENT:  Pupils are reactive to light and extraocular muscles are intact. NECK:  No  jugular venous distention.  No carotid bruit.  No lymphadenopathy. CHEST:  With minimal scattered rhonchi and no rales. CARDIAC:  Heart sounds are 1 and 2, slightly tachycardic. ABDOMEN:  Soft, nontender.  Liver, spleen, and kidney not palpable. Bowel sounds are positive. EXTREMITIES:  Showed no pedal edema. NEUROLOGIC:  Showed muscle power in right upper extremity 5/5, right lower extremity 3/5, left upper extremity 5/5, left lower extremity 3/5. Sensory sensation intact. MUSCULOSKELETAL:  Show arthritic changes in the knees and in the feet with tenderness in the lumbosacral region. SKIN:  Normal turgor with multiple nodular lesions all over the body.  LABORATORY DATA:  Initial chemistry showed sodium of 142, potassium of 5.1, chloride of 106, bicarb of 28,  glucose is 98, BUN is 19, creatinine is 0.81.  UA unremarkable.  Basic complete blood count with no differential showed WBC of 9.2, hemoglobin of 15.5, hematocrit of 46.3, MCV of 9.6, platelet count of 269.  Imaging studies done on the patient include chest x-ray, which showed osteopenia and scoliosis.  X-ray of the pelvis showed postoperative changes of the right hip, no acute findings.  X-ray of the lumbar spine showed postoperative changes L1-S1, no acute findings.  IMPRESSION: 1. Lower back pain secondary to a fall, rule out fracture (?muscle     spasm).  2. Neurofibromatosis. 3. Chronic obstructive pulmonary disease. 4. Anxiety disorder. 5. Depression. 6. Recently diagnosed  lung cancer.  PLAN:  To admit the patient to general medical floor.  The patient's pain will be controlled with Dilaudid 2 mg IV q.4 p.r.n. and she will also be on oxycodone 10 mg SR 2 tablets p.o. b.i.d. p.r.n. for pain. She will be on a muscle relaxant Robaxin 1500 mg p.o. t.i.d.  Other medication to be given to the patient will include paroxetine 25 mg q.a.m., Lyrica 75 mg p.o. b.i.d., doxepin 75 mg p.o. at bedtime, docusate 100 mg p.o. daily, cilostazol 100 mg p.o. b.i.d., Xanax 0.5 mg p.o. t.i.d.  She will also be on albuterol and Atrovent nebs q.6 p.r.n. for shortness of breath and wheezing.GI prophylaxis with Protonix 40 mg p.o. daily and DVT prophylaxis with Lovenox 40 mg subcu q.24.  Further workup to be done on this patient will include MRI of the lumbosacral spine.  Physical Therapy will be consulted for gradual ambulation of this patient.  The patient will be followed and evaluated on a day-to-day basis.     Talmage Nap, MD     CN/MEDQ  D:  01/04/2011  T:  01/04/2011  Job:  [id number]  Electronically Signed by Talmage Nap  on 01/04/2011 03:44:24 PM

## 2011-01-05 LAB — URINE CULTURE
Colony Count: NO GROWTH
Culture  Setup Time: 201205301047

## 2011-01-05 LAB — COMPREHENSIVE METABOLIC PANEL
ALT: 10 U/L (ref 0–35)
AST: 13 U/L (ref 0–37)
CO2: 21 mEq/L (ref 19–32)
Calcium: 8.8 mg/dL (ref 8.4–10.5)
GFR calc Af Amer: >60 mL/min (ref >60)
Potassium: 4.1 mEq/L (ref 3.5–5.1)
Sodium: 137 mEq/L (ref 135–145)
Total Protein: 6.7 g/dL (ref 6.0–8.3)

## 2011-01-05 LAB — CBC
Hemoglobin: 15.2 g/dL — ABNORMAL HIGH (ref 12.0–15.0)
MCHC: 33 g/dL (ref 30.0–36.0)
RDW: 15.7 % — ABNORMAL HIGH (ref 11.5–15.5)
WBC: 8.4 10*3/uL (ref 4.0–10.5)

## 2011-01-05 LAB — DIFFERENTIAL
Basophils Absolute: 0.1 10*3/uL (ref 0.0–0.1)
Basophils Relative: 1 % (ref 0–1)
Monocytes Absolute: 0.6 10*3/uL (ref 0.1–1.0)
Neutro Abs: 6.1 10*3/uL (ref 1.7–7.7)

## 2011-01-09 ENCOUNTER — Encounter: Payer: Medicare Other | Attending: Physical Medicine & Rehabilitation | Admitting: Physical Medicine & Rehabilitation

## 2011-01-09 DIAGNOSIS — M48061 Spinal stenosis, lumbar region without neurogenic claudication: Secondary | ICD-10-CM

## 2011-01-09 DIAGNOSIS — M79609 Pain in unspecified limb: Secondary | ICD-10-CM | POA: Insufficient documentation

## 2011-01-09 DIAGNOSIS — J449 Chronic obstructive pulmonary disease, unspecified: Secondary | ICD-10-CM | POA: Insufficient documentation

## 2011-01-09 DIAGNOSIS — M1610 Unilateral primary osteoarthritis, unspecified hip: Secondary | ICD-10-CM

## 2011-01-09 DIAGNOSIS — M810 Age-related osteoporosis without current pathological fracture: Secondary | ICD-10-CM | POA: Insufficient documentation

## 2011-01-09 DIAGNOSIS — F17200 Nicotine dependence, unspecified, uncomplicated: Secondary | ICD-10-CM | POA: Insufficient documentation

## 2011-01-09 DIAGNOSIS — G8929 Other chronic pain: Secondary | ICD-10-CM | POA: Insufficient documentation

## 2011-01-09 DIAGNOSIS — M8708 Idiopathic aseptic necrosis of bone, other site: Secondary | ICD-10-CM | POA: Insufficient documentation

## 2011-01-09 DIAGNOSIS — M961 Postlaminectomy syndrome, not elsewhere classified: Secondary | ICD-10-CM

## 2011-01-09 DIAGNOSIS — M545 Low back pain, unspecified: Secondary | ICD-10-CM | POA: Insufficient documentation

## 2011-01-09 DIAGNOSIS — IMO0002 Reserved for concepts with insufficient information to code with codable children: Secondary | ICD-10-CM

## 2011-01-09 DIAGNOSIS — C3490 Malignant neoplasm of unspecified part of unspecified bronchus or lung: Secondary | ICD-10-CM | POA: Insufficient documentation

## 2011-01-09 DIAGNOSIS — J4489 Other specified chronic obstructive pulmonary disease: Secondary | ICD-10-CM | POA: Insufficient documentation

## 2011-01-10 NOTE — Assessment & Plan Note (Signed)
[name] is back regarding her low back pain.  She was recently diagnosed with lung cancer.  She sees Radiation Oncology apparently next week. Dr. Maple Hudson remains in charge of her pulmonary care.  She states that her pain has been under fair control with her q.4-hour oxycodone and Percocet.  She came off the methadone for a "allergy which turns out to be a rash."  Even after coming off the methadone, her rash is still persistent.  She rates her pain about 6/10, described as stabbing.  Pain is in the low back and down in the left leg predominantly.  Sleep is fair.  REVIEW OF SYSTEMS:  Notable for the above.  Full 12-point review is in the written health and history section of the chart.  She remains on oxygen and now is at 4 L.  She is not an operative candidate due to her pulmonary disease.  SOCIAL HISTORY:  Unchanged.  Her husband is with her today and supportive.  PHYSICAL EXAMINATION:  VITAL SIGNS:  Blood pressure is 152/77, pulse is 102, respiratory rate 24 and she is satting 78% on room air. GENERAL:  The patient is pleasant, alert and oriented x3.  Affect is generally appropriate.  She remains anxious. BACK:  Range of motion is limited to flexion more than extension.  She has shortness of breath with any exertion really in the office today. EXTREMITIES:  Sensory exam is perhaps a bit diminished in the distal limbs.  ASSESSMENT: 1. 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 2. Avascular necrosis of both hips. 3. Osteoporosis. 4. Severe chronic obstructive pulmonary disease and now newly     diagnosed lung cancer. 5. Tobacco abuse.  PLAN: 1. We will add OxyContin to her regimen as there is a perceived     methadone intolerance.  Begin 20 mg OxyContin CR q.12 h. with     Percocet 5/325 one q.8 h. p.r.n. 2. The patient will begin her Oncology and Radiation Oncology     treatment.  I will be happy to assist with her pain  as possible. 3. We will have her see the nurse practitioner back here in about a     month.     Ranelle Oyster, M.D. Electronically Signed    ZTS/MedQ D:  01/09/2011 13:34:00  T:  01/10/2011 01:24:25  Job #:  [id number]  cc:   Joni Fears D. Maple Hudson, MD, FCCP, FACP [facility] [address] [place] [address]

## 2011-01-16 ENCOUNTER — Telehealth: Payer: Self-pay | Admitting: Internal Medicine

## 2011-01-16 NOTE — Telephone Encounter (Signed)
Spoke with pt. She states that she is getting ready to start RT for lung ca and is very nervous, sick to her stomach thinking about this. Wants something for her nerves. Dr Maple Hudson, pls advise if you would be willing to prescribe this for her. Thanks! Allergies  Allergen Reactions  . Penicillins

## 2011-01-16 NOTE — Telephone Encounter (Signed)
PT CALLED BACK AGAIN- SAYS SHE HAS SCRATCHED HER ARMS UNTIL THEY ARE BLEEDING BECAUSE SHE IS "SO NERVOUS". REQUESTS RX ASAP.

## 2011-01-16 NOTE — Telephone Encounter (Signed)
We gave Xanax 1 mg, # 50, on May 4, with 1 refill. What is the status of that??  If already used up, she can have Xanax 1 mg, # 20,  1-2  .tid prn, with no refills.

## 2011-01-16 NOTE — Telephone Encounter (Signed)
Called and spoke with Olegario Messier, physical therapist from [facility].  She states she just got done visiting patient, and pt is a "nervous wreck."  She staes pt is on edge and is not herself- very nervous since getting cancer diagnosis.  Olegario Messier is wanting to know if pt can get something for her nerves.  Pt has taken xanax in the past. Requesting rx [facility] called into [facility] on [address].  CY, please advise.  Thank you.   Allergies  Allergen Reactions  . Penicillins

## 2011-01-16 NOTE — Telephone Encounter (Signed)
[email or web address]  

## 2011-01-17 NOTE — Telephone Encounter (Signed)
There is another message regarding this in pt's chart.  Per CY, he will not prescribe this for pt. She needs to discuss this with her cancer dr.  Jeanene Erb and LM with Olegario Messier from [facility] and informed her of this.  Nothing further needed.

## 2011-01-17 NOTE — Telephone Encounter (Signed)
We WILL NOT give her any medications for this-she needs to be redirected to her Cancer dr and have them Rx something for her.

## 2011-01-17 NOTE — Telephone Encounter (Signed)
[email or web address]

## 2011-01-17 NOTE — Telephone Encounter (Signed)
Pt called back.  Informed her of CY's response.  Pt states she will check with her cancer MD.  Nothing further needed.

## 2011-01-17 NOTE — Telephone Encounter (Signed)
Encounter created in error

## 2011-01-18 ENCOUNTER — Ambulatory Visit
Admission: RE | Admit: 2011-01-18 | Discharge: 2011-01-18 | Disposition: A | Discharge status: Home / Self Care | Payer: Medicare Other | Source: Ambulatory Visit | Attending: Radiation Oncology | Admitting: Radiation Oncology

## 2011-01-18 DIAGNOSIS — R51 Headache: Secondary | ICD-10-CM | POA: Insufficient documentation

## 2011-01-18 DIAGNOSIS — Z510 Encounter for antineoplastic radiation therapy: Secondary | ICD-10-CM | POA: Insufficient documentation

## 2011-01-18 DIAGNOSIS — C3410 Malignant neoplasm of upper lobe, unspecified bronchus or lung: Secondary | ICD-10-CM | POA: Insufficient documentation

## 2011-01-23 ENCOUNTER — Telehealth: Payer: Self-pay | Admitting: Internal Medicine

## 2011-01-23 NOTE — Telephone Encounter (Signed)
This was a triage call report from [facility].  Call Date and Time was from 01/16/11 at 5:35pm -- this same message was addressed earlier in the day on 01/16/11 by CDY.  She was instructed to call her cancer dr to see if he would give her rx for her nerves.  The fax from [facility] was placed on CDY's cart so he is aware pt called them.  Will also forward this message to him.

## 2011-01-23 NOTE — Telephone Encounter (Signed)
This is an office not from triage call report: Pt is on O2 continously and she is supposed to start radiation Wed 01/18/11 for lung ca and she is very scared. Pt is scratching her skin that is very itchy and she is "scratching blood."  Anxiety started about 1 week ago.  Her father died of lung ca. [facility] [address] [phone] and spoke with Rudell Cobb nothing has been called in.  Checked chart and nothing called in and no notes for today, 01/16/11. Inst needs to be evaluated at e/r or u/c, strongly urged pt to go. Dr Maple Hudson not part of pc group she is asking if he will call something in 01/17/11. inst her to call his off 01/17/11 also for f/u. Office note! TY!   Operator Caswell Corwin Call taken 01/16/11 @ 5:35:44pm.

## 2011-01-24 NOTE — Telephone Encounter (Signed)
She needs to discuss pain and anxiolytic meds with her PCP, pain doctor and cancer doctors.

## 2011-01-25 NOTE — Telephone Encounter (Signed)
Pt advised to call PCP, pain doctors and cancer doctor for pain meds and anxiety medications. Carron Curie, CMA

## 2011-01-26 ENCOUNTER — Emergency Department (HOSPITAL_COMMUNITY)
Admission: EM | Admit: 2011-01-26 | Discharge: 2011-01-26 | Disposition: A | Discharge status: Home / Self Care | Payer: Medicare Other | Attending: Emergency Medicine | Admitting: Emergency Medicine

## 2011-01-26 DIAGNOSIS — R000 Tachycardia, unspecified: Secondary | ICD-10-CM | POA: Insufficient documentation

## 2011-01-26 DIAGNOSIS — R112 Nausea with vomiting, unspecified: Secondary | ICD-10-CM | POA: Insufficient documentation

## 2011-01-26 DIAGNOSIS — F411 Generalized anxiety disorder: Secondary | ICD-10-CM | POA: Insufficient documentation

## 2011-01-26 DIAGNOSIS — G43909 Migraine, unspecified, not intractable, without status migrainosus: Secondary | ICD-10-CM | POA: Insufficient documentation

## 2011-01-26 DIAGNOSIS — L2989 Other pruritus: Secondary | ICD-10-CM | POA: Insufficient documentation

## 2011-01-26 DIAGNOSIS — Q8500 Neurofibromatosis, unspecified: Secondary | ICD-10-CM | POA: Insufficient documentation

## 2011-01-26 DIAGNOSIS — C3490 Malignant neoplasm of unspecified part of unspecified bronchus or lung: Secondary | ICD-10-CM | POA: Insufficient documentation

## 2011-01-26 DIAGNOSIS — J449 Chronic obstructive pulmonary disease, unspecified: Secondary | ICD-10-CM | POA: Insufficient documentation

## 2011-01-26 DIAGNOSIS — J4489 Other specified chronic obstructive pulmonary disease: Secondary | ICD-10-CM | POA: Insufficient documentation

## 2011-01-26 DIAGNOSIS — L298 Other pruritus: Secondary | ICD-10-CM | POA: Insufficient documentation

## 2011-01-26 DIAGNOSIS — H53149 Visual discomfort, unspecified: Secondary | ICD-10-CM | POA: Insufficient documentation

## 2011-01-28 NOTE — Discharge Summary (Signed)
NAMELONISHA, BOBBY               ACCOUNT NO.:  [id number]  MEDICAL RECORD NO.:  [id number]           PATIENT TYPE:  O  LOCATION:  5009                         FACILITY:  [facility]  PHYSICIAN:  Marcellus Scott, MD     DATE OF BIRTH:  Mar 07, 1951  DATE OF ADMISSION:  01/04/2011 DATE OF DISCHARGE:  01/07/2011                              DISCHARGE SUMMARY   PRIMARY CARE PHYSICIAN:  Providence Crosby, MD  PAIN MANAGEMENT:  Ranelle Oyster, MD  RADIATION ONCOLOGIST:  Lurline Hare, MD  ONCOLOGIST:  Lajuana Matte, MD  PULMONOLOGIST:  Rennis Chris. Young, MD, FCCP, FACP  DISCHARGE DIAGNOSES: 1. Acute-on-chronic low back pain status post fall.  Improved. 2. Chronic hypoxic respiratory failure secondary to chronic     obstructive pulmonary disease and pulmonary fibrosis.  Oxygen     dependent. 3. Recent lung cancer diagnosis. 4. Tobacco abuse history. 5. Neurofibromatosis. 6. History of anxiety and depression. 7. History of hypertension.  DISCHARGE MEDICATIONS:  These are unchanged from prior to admission. 1. Advair HFA 230/21 mcg, 1 puff inhaled b.i.d. 2. Albuterol nebulization 0.083% solution, one vial inhaled q. 6     hourly p.r.n. along with Atrovent nebulizations. 3. Alprazolam 0.5 mg p.o. t.i.d. 4. Atrovent nebulization, 0.02% solution, one vial inhaled q. 6 hourly     p.r.n. along with the albuterol nebulizations. 5. Cilostazol 100 mg p.o. b.i.d. 6. Combivent inhaler 2 puffs inhaled q. 6 hourly p.r.n. 7. Docusate 100 mg p.o. daily. 8. Doxepin 75 mg p.o. at bedtime. 9. Hydroxyzine 25 mg p.o. b.i.d. p.r.n. for itching. 10.Lyrica 75 mg p.o. b.i.d. 11.Oxycodone SR 20 mg p.o. q.12 hours. 12.Paroxetine 20 mg p.o. daily. 13.Percocet 5/325, 1 tablet p.o. q. 6 hourly p.r.n. for pain. 14.Oxygen via nasal cannula at 4-5 liters per minute inhaled     continuously.  IMAGING: 1. X-ray of the lumbar spine, impression; postoperative changes of L5-     S1.  No acute findings or  interval changes. 2. Thoracic spine x-ray, impression; osteopenia and scoliosis.  No     definite acute bony findings. 3. X-ray of the pelvis, impression; postoperative changes of the right     hip.  No acute findings.  LABORATORY DATA:  Urine culture negative.  Magnesium 2.1.  Comprehensive metabolic panel only significant for alkaline phosphatase 155 and albumin 3.  CBC only significant for hemoglobin of 15.2.  Urinalysis negative.  CONSULTATIONS:  None.  DIET:  Heart-healthy diet.  ACTIVITY:  Increase activity slowly.  The patient is to use her rolling walker and cane as per directions.  COMPLAINTS:  The patient indicates that her back pain is back to its baseline.  She ambulated yesterday with the help of a walker and assistance up to the nursing station.  She indicates that her breathing is also back to the baseline.  PHYSICAL EXAMINATION:  GENERAL:  The patient is in no obvious distress. VITAL SIGNS:  Temperature 96.8 degrees Fahrenheit, pulse 91 per minute regular, respiration 18 per minute, blood pressure 107/64 mmHg andsaturating in the 90s on 4 liters of oxygen. RESPIRATORY SYSTEM:  Few coarse basal crackles which appear chronic  but otherwise clear to auscultation and no increased work of breathing. CARDIOVASCULAR SYSTEM:  First and second heart sounds are regular. ABDOMEN:  Nondistended, soft and bowel sounds present. CENTRAL NERVOUS SYSTEM:  The patient is awake, alert, oriented x3 with no focal neurological deficits. EXTREMITIES:  5/5 power. SKIN:  Extensive lesions of neurofibromatosis.  HOSPITAL COURSE:  Ms. Veldhuizen is a 60 year old female patient with history of neurofibromatosis, multiple low back surgeries with a spinal stimulator, chronic hypoxic respiratory failure who is oxygen dependent, COPD, pulmonary fibrosis with recently diagnosed lung cancer.  She sees pain management MD.  She apparently fell on her buttock 2 days prior to admission and then had  extreme difficulty in ambulating with a cane. There was no history of radiation of pain to the lower extremities or no associated lower extremity weakness or sensory abnormalities.  There was no sphincter disturbance.  She presented to the emergency room for further evaluation.  PROBLEMS: 1. Acute-on-chronic low back pain secondary to fall.  The patient was     admitted to the hospital.  Her pain medications were adjusted.  She     was placed on brief course of antispasmodics.  MRI could not be     done secondary to her spinal stimulator.  With these measures,     however, the patient made remarkable recovery and her back pain is     back to its usual and she has ambulated with the help of a walker.     She will be discharged home on her usual home medications which can     be adjusted by the pain MD as an outpatient as deemed necessary.     The patient also indicates that she takes the OxyContin scheduled     and not as needed. 2. Chronic hypoxic respiratory failure which is oxygen dependent     secondary to chronic obstructive pulmonary disease and pulmonary     fibrosis.  This has remained stable. 3. Recent diagnosis of lung cancer.  This dictator has alerted the     oncologists of the patient's admission and the patient apparently     has a follow-up with the radiation oncologist on January 18, 2011.     She has to keep up the appointment. 4. Tobacco abuse.  Cessation counseling done. 5. Neurofibromatosis.  The patient has periodic itching.  DISPOSITION:  The patient is discharged home in stable condition.  FOLLOWUP RECOMMENDATIONS: 1. With Dr. Providence Crosby.  The patient is to call for an     appointment. 2. With Dr. Faith Rogue.  The patient is to call for an     appointment. 3. With Dr. Lurline Hare on January 18, 2011. 4. With Dr. Jetty Duhamel.  The patient is to call for an appointment.  Time taken in coordinating this discharge is 30 minutes.     Marcellus Scott,  MD     AH/MEDQ  D:  01/07/2011  T:  01/08/2011  Job:  [id number]  cc:   Lajuana Matte, M.D. Clinton D. Maple Hudson, MD, [place], FACP Bennett Scrape, MD Ranelle Oyster, M.D. Lurline Hare, M.D.  Electronically Signed by Marcellus Scott MD on 01/28/2011 10:24:41 PM

## 2011-02-05 ENCOUNTER — Inpatient Hospital Stay (INDEPENDENT_AMBULATORY_CARE_PROVIDER_SITE_OTHER)
Admission: RE | Admit: 2011-02-05 | Discharge: 2011-02-05 | Disposition: A | Discharge status: Home / Self Care | Payer: Medicare Other | Source: Ambulatory Visit | Attending: Emergency Medicine | Admitting: Emergency Medicine

## 2011-02-05 ENCOUNTER — Emergency Department (HOSPITAL_COMMUNITY)
Admission: EM | Admit: 2011-02-05 | Discharge: 2011-02-05 | Disposition: A | Discharge status: Home / Self Care | Payer: Medicare Other | Attending: Emergency Medicine | Admitting: Emergency Medicine

## 2011-02-05 DIAGNOSIS — Z85118 Personal history of other malignant neoplasm of bronchus and lung: Secondary | ICD-10-CM | POA: Insufficient documentation

## 2011-02-05 DIAGNOSIS — I10 Essential (primary) hypertension: Secondary | ICD-10-CM

## 2011-02-05 DIAGNOSIS — J4489 Other specified chronic obstructive pulmonary disease: Secondary | ICD-10-CM | POA: Insufficient documentation

## 2011-02-05 DIAGNOSIS — H53149 Visual discomfort, unspecified: Secondary | ICD-10-CM | POA: Insufficient documentation

## 2011-02-05 DIAGNOSIS — F341 Dysthymic disorder: Secondary | ICD-10-CM | POA: Insufficient documentation

## 2011-02-05 DIAGNOSIS — J449 Chronic obstructive pulmonary disease, unspecified: Secondary | ICD-10-CM | POA: Insufficient documentation

## 2011-02-05 DIAGNOSIS — R110 Nausea: Secondary | ICD-10-CM | POA: Insufficient documentation

## 2011-02-05 DIAGNOSIS — G43909 Migraine, unspecified, not intractable, without status migrainosus: Secondary | ICD-10-CM | POA: Insufficient documentation

## 2011-02-05 DIAGNOSIS — Q8500 Neurofibromatosis, unspecified: Secondary | ICD-10-CM | POA: Insufficient documentation

## 2011-02-05 DIAGNOSIS — R51 Headache: Secondary | ICD-10-CM

## 2011-02-05 LAB — GLUCOSE, CAPILLARY: Glucose-Capillary: 99 mg/dL (ref 70–99)

## 2011-02-07 ENCOUNTER — Ambulatory Visit: Payer: Medicare Other | Admitting: Neurosurgery

## 2011-02-09 ENCOUNTER — Telehealth: Payer: Self-pay | Admitting: Internal Medicine

## 2011-02-09 NOTE — Telephone Encounter (Signed)
Called and spoke with pt---she stated that her nerves are shot---she is unable to sleep at night and she is scratching all over on her arms and legs due to the radiation.  Pt is requesting something for her nerves.  She stated that she is really tore up about the cancer diagnosis.  Checked with Florentina Addison and CY and per CY---she will need to call her primary care doctor or the cancer doctor for these meds.   thanks

## 2011-02-09 NOTE — Telephone Encounter (Signed)
Spoke with patient-aware to call her PCP or Cancer dr for this medication.

## 2011-03-02 ENCOUNTER — Emergency Department (HOSPITAL_COMMUNITY)
Admission: EM | Admit: 2011-03-02 | Discharge: 2011-03-02 | Disposition: A | Discharge status: Home / Self Care | Payer: Medicare Other | Attending: Emergency Medicine | Admitting: Emergency Medicine

## 2011-03-02 DIAGNOSIS — I10 Essential (primary) hypertension: Secondary | ICD-10-CM | POA: Insufficient documentation

## 2011-03-02 DIAGNOSIS — R51 Headache: Secondary | ICD-10-CM | POA: Insufficient documentation

## 2011-03-02 DIAGNOSIS — J449 Chronic obstructive pulmonary disease, unspecified: Secondary | ICD-10-CM | POA: Insufficient documentation

## 2011-03-02 DIAGNOSIS — J4489 Other specified chronic obstructive pulmonary disease: Secondary | ICD-10-CM | POA: Insufficient documentation

## 2011-03-14 ENCOUNTER — Emergency Department (HOSPITAL_COMMUNITY)
Admission: EM | Admit: 2011-03-14 | Discharge: 2011-03-14 | Disposition: A | Discharge status: Home / Self Care | Payer: Medicare Other | Attending: Emergency Medicine | Admitting: Emergency Medicine

## 2011-03-14 DIAGNOSIS — Z79899 Other long term (current) drug therapy: Secondary | ICD-10-CM | POA: Insufficient documentation

## 2011-03-23 ENCOUNTER — Other Ambulatory Visit: Payer: Self-pay | Admitting: Radiation Oncology

## 2011-03-23 ENCOUNTER — Ambulatory Visit
Admission: RE | Admit: 2011-03-23 | Discharge: 2011-03-23 | Disposition: A | Discharge status: Home / Self Care | Payer: Medicare Other | Source: Ambulatory Visit | Attending: Radiation Oncology | Admitting: Radiation Oncology

## 2011-03-23 DIAGNOSIS — C3410 Malignant neoplasm of upper lobe, unspecified bronchus or lung: Secondary | ICD-10-CM | POA: Insufficient documentation

## 2011-03-23 DIAGNOSIS — C3490 Malignant neoplasm of unspecified part of unspecified bronchus or lung: Secondary | ICD-10-CM

## 2011-04-06 ENCOUNTER — Ambulatory Visit (INDEPENDENT_AMBULATORY_CARE_PROVIDER_SITE_OTHER): Payer: Medicare Other

## 2011-04-06 ENCOUNTER — Inpatient Hospital Stay (INDEPENDENT_AMBULATORY_CARE_PROVIDER_SITE_OTHER)
Admission: RE | Admit: 2011-04-06 | Discharge: 2011-04-06 | Disposition: A | Discharge status: Home / Self Care | Payer: Medicare Other | Source: Ambulatory Visit | Attending: Emergency Medicine | Admitting: Emergency Medicine

## 2011-04-06 DIAGNOSIS — J189 Pneumonia, unspecified organism: Secondary | ICD-10-CM

## 2011-04-06 DIAGNOSIS — J40 Bronchitis, not specified as acute or chronic: Secondary | ICD-10-CM

## 2011-04-11 ENCOUNTER — Emergency Department (HOSPITAL_COMMUNITY): Payer: Medicare Other

## 2011-04-11 ENCOUNTER — Emergency Department (HOSPITAL_COMMUNITY)
Admission: EM | Admit: 2011-04-11 | Discharge: 2011-04-11 | Disposition: A | Discharge status: Home / Self Care | Payer: Medicare Other | Attending: Emergency Medicine | Admitting: Emergency Medicine

## 2011-04-11 DIAGNOSIS — J449 Chronic obstructive pulmonary disease, unspecified: Secondary | ICD-10-CM | POA: Insufficient documentation

## 2011-04-11 DIAGNOSIS — R0989 Other specified symptoms and signs involving the circulatory and respiratory systems: Secondary | ICD-10-CM | POA: Insufficient documentation

## 2011-04-11 DIAGNOSIS — R05 Cough: Secondary | ICD-10-CM | POA: Insufficient documentation

## 2011-04-11 DIAGNOSIS — R0609 Other forms of dyspnea: Secondary | ICD-10-CM | POA: Insufficient documentation

## 2011-04-11 DIAGNOSIS — J705 Respiratory conditions due to smoke inhalation: Secondary | ICD-10-CM | POA: Insufficient documentation

## 2011-04-11 DIAGNOSIS — J4489 Other specified chronic obstructive pulmonary disease: Secondary | ICD-10-CM | POA: Insufficient documentation

## 2011-04-11 DIAGNOSIS — R059 Cough, unspecified: Secondary | ICD-10-CM | POA: Insufficient documentation

## 2011-04-11 DIAGNOSIS — R079 Chest pain, unspecified: Secondary | ICD-10-CM | POA: Insufficient documentation

## 2011-04-11 DIAGNOSIS — F411 Generalized anxiety disorder: Secondary | ICD-10-CM | POA: Insufficient documentation

## 2011-04-11 LAB — CBC
HCT: 46.7 % — ABNORMAL HIGH (ref 36.0–46.0)
MCHC: 33.4 g/dL (ref 30.0–36.0)
RDW: 17 % — ABNORMAL HIGH (ref 11.5–15.5)

## 2011-04-11 LAB — POCT I-STAT, CHEM 8
Calcium, Ion: 1.17 mmol/L (ref 1.12–1.32)
HCT: 50 % — ABNORMAL HIGH (ref 36.0–46.0)
Hemoglobin: 17 g/dL — ABNORMAL HIGH (ref 12.0–15.0)
TCO2: 21 mmol/L (ref 0–100)

## 2011-04-11 LAB — DIFFERENTIAL
Basophils Absolute: 0 10*3/uL (ref 0.0–0.1)
Basophils Relative: 0 % (ref 0–1)
Eosinophils Relative: 0 % (ref 0–5)
Monocytes Absolute: 0.7 10*3/uL (ref 0.1–1.0)

## 2011-04-11 LAB — POCT I-STAT TROPONIN I: Troponin i, poc: 0 ng/mL (ref 0.00–0.08)

## 2011-04-12 ENCOUNTER — Ambulatory Visit
Admission: RE | Admit: 2011-04-12 | Discharge: 2011-04-12 | Disposition: A | Discharge status: Home / Self Care | Payer: Medicare Other | Source: Ambulatory Visit | Attending: Radiation Oncology | Admitting: Radiation Oncology

## 2011-04-12 LAB — BUN: BUN: 22 mg/dL (ref 6–23)

## 2011-04-12 LAB — CREATININE, SERUM: Creatinine, Ser: 0.81 mg/dL (ref 0.50–1.10)

## 2011-04-13 ENCOUNTER — Ambulatory Visit (HOSPITAL_COMMUNITY)
Admission: RE | Admit: 2011-04-13 | Discharge: 2011-04-13 | Disposition: A | Discharge status: Home / Self Care | Payer: Medicare Other | Source: Ambulatory Visit | Attending: Radiation Oncology | Admitting: Radiation Oncology

## 2011-04-13 ENCOUNTER — Encounter (HOSPITAL_COMMUNITY): Payer: Self-pay

## 2011-04-13 DIAGNOSIS — R05 Cough: Secondary | ICD-10-CM | POA: Insufficient documentation

## 2011-04-13 DIAGNOSIS — R0602 Shortness of breath: Secondary | ICD-10-CM | POA: Insufficient documentation

## 2011-04-13 DIAGNOSIS — E042 Nontoxic multinodular goiter: Secondary | ICD-10-CM | POA: Insufficient documentation

## 2011-04-13 DIAGNOSIS — C3490 Malignant neoplasm of unspecified part of unspecified bronchus or lung: Secondary | ICD-10-CM

## 2011-04-13 DIAGNOSIS — R059 Cough, unspecified: Secondary | ICD-10-CM | POA: Insufficient documentation

## 2011-04-13 DIAGNOSIS — R0789 Other chest pain: Secondary | ICD-10-CM | POA: Insufficient documentation

## 2011-04-13 MED ORDER — IOHEXOL 300 MG/ML  SOLN
80.0000 mL | Freq: Once | INTRAMUSCULAR | Status: AC | PRN
  Administered 2011-04-13: 80 mL via INTRAVENOUS

## 2011-04-18 ENCOUNTER — Ambulatory Visit: Payer: Medicare Other | Admitting: Internal Medicine

## 2011-05-02 LAB — CBC
HCT: 40.4
HCT: 40.9
HCT: 44.6
Hemoglobin: 13.7
Hemoglobin: 15.3 — ABNORMAL HIGH
MCHC: 33.8
MCV: 94.1
Platelets: 351
Platelets: 264
Platelets: 322
RBC: 4.29
RBC: 4.32
RDW: 14.1
RDW: 14.3
WBC: 16.8 — ABNORMAL HIGH
WBC: 10
WBC: 15.9 — ABNORMAL HIGH
WBC: 9.6

## 2011-05-02 LAB — BASIC METABOLIC PANEL
BUN: 3 — ABNORMAL LOW
BUN: 9
CO2: 26
Calcium: 9
Chloride: 108
Creatinine, Ser: 0.73
GFR calc Af Amer: >60
GFR calc Af Amer: >60
GFR calc non Af Amer: >60
GFR calc non Af Amer: >60
Glucose, Bld: 125 — ABNORMAL HIGH
Potassium: 3.8
Potassium: 4
Potassium: 4.4
Sodium: 134 — ABNORMAL LOW
Sodium: 139
Sodium: 139

## 2011-05-02 LAB — BLOOD GAS, ARTERIAL
Bicarbonate: 28.5 — ABNORMAL HIGH
Bicarbonate: 28.7 — ABNORMAL HIGH
Drawn by: 229971
O2 Content: 15
O2 Saturation: 95.2
Patient temperature: 98.6
TCO2: 24.9
pCO2 arterial: 45.2 — ABNORMAL HIGH
pH, Arterial: 7.39
pH, Arterial: 7.419 — ABNORMAL HIGH
pO2, Arterial: 70.3 — ABNORMAL LOW
pO2, Arterial: 91.8

## 2011-05-02 LAB — DIFFERENTIAL
Basophils Absolute: 0
Basophils Absolute: 0
Basophils Relative: 0
Eosinophils Absolute: 0
Eosinophils Relative: 0
Eosinophils Relative: 0
Lymphocytes Relative: 4 — ABNORMAL LOW
Lymphocytes Relative: 3 — ABNORMAL LOW
Lymphs Abs: 0.5 — ABNORMAL LOW
Lymphs Abs: 0.6 — ABNORMAL LOW
Monocytes Absolute: 0.5
Monocytes Absolute: 0.6
Monocytes Relative: 3
Monocytes Relative: 6
Neutro Abs: 14.8 — ABNORMAL HIGH

## 2011-05-02 LAB — PROTIME-INR
INR: 1
INR: 1.2
INR: 2.2 — ABNORMAL HIGH
Prothrombin Time: 13
Prothrombin Time: 18.6 — ABNORMAL HIGH
Prothrombin Time: 22.1 — ABNORMAL HIGH
Prothrombin Time: 25.4 — ABNORMAL HIGH

## 2011-05-02 LAB — B-NATRIURETIC PEPTIDE (CONVERTED LAB): Pro B Natriuretic peptide (BNP): 79.5

## 2011-05-02 LAB — CARDIAC PANEL(CRET KIN+CKTOT+MB+TROPI): Troponin I: 0.04

## 2011-05-02 LAB — COMPREHENSIVE METABOLIC PANEL
AST: 24
Albumin: 3.3 — ABNORMAL LOW
Chloride: 109
Creatinine, Ser: 0.78
GFR calc Af Amer: >60
Potassium: 3.6
Total Bilirubin: 0.7
Total Protein: 6.8

## 2011-05-02 LAB — SAMPLE TO BLOOD BANK

## 2011-05-02 LAB — URINALYSIS, ROUTINE W REFLEX MICROSCOPIC
Nitrite: NEGATIVE
Specific Gravity, Urine: 1.028
pH: 5

## 2011-05-02 LAB — APTT: aPTT: 28

## 2011-05-02 LAB — HEMOGLOBIN A1C
Hgb A1c MFr Bld: 4.8
Mean Plasma Glucose: 94

## 2011-05-03 ENCOUNTER — Ambulatory Visit (INDEPENDENT_AMBULATORY_CARE_PROVIDER_SITE_OTHER): Payer: Medicare Other

## 2011-05-03 ENCOUNTER — Inpatient Hospital Stay (INDEPENDENT_AMBULATORY_CARE_PROVIDER_SITE_OTHER)
Admission: RE | Admit: 2011-05-03 | Discharge: 2011-05-03 | Disposition: A | Discharge status: Home / Self Care | Payer: Medicare Other | Source: Ambulatory Visit | Attending: Family Medicine | Admitting: Family Medicine

## 2011-05-03 DIAGNOSIS — S32009A Unspecified fracture of unspecified lumbar vertebra, initial encounter for closed fracture: Secondary | ICD-10-CM

## 2011-05-06 ENCOUNTER — Inpatient Hospital Stay (HOSPITAL_COMMUNITY)
Admission: EM | Admit: 2011-05-06 | Discharge: 2011-05-14 | DRG: 602 | Disposition: A | Discharge status: Home Health | Payer: Medicare Other | Attending: Internal Medicine | Admitting: Internal Medicine

## 2011-05-06 ENCOUNTER — Emergency Department (HOSPITAL_COMMUNITY): Payer: Medicare Other

## 2011-05-06 DIAGNOSIS — I959 Hypotension, unspecified: Secondary | ICD-10-CM | POA: Diagnosis not present

## 2011-05-06 DIAGNOSIS — G894 Chronic pain syndrome: Secondary | ICD-10-CM | POA: Diagnosis present

## 2011-05-06 DIAGNOSIS — M810 Age-related osteoporosis without current pathological fracture: Secondary | ICD-10-CM | POA: Diagnosis present

## 2011-05-06 DIAGNOSIS — A4902 Methicillin resistant Staphylococcus aureus infection, unspecified site: Secondary | ICD-10-CM | POA: Diagnosis present

## 2011-05-06 DIAGNOSIS — Q8500 Neurofibromatosis, unspecified: Secondary | ICD-10-CM | POA: Diagnosis present

## 2011-05-06 DIAGNOSIS — IMO0002 Reserved for concepts with insufficient information to code with codable children: Principal | ICD-10-CM | POA: Diagnosis present

## 2011-05-06 DIAGNOSIS — Z79899 Other long term (current) drug therapy: Secondary | ICD-10-CM

## 2011-05-06 DIAGNOSIS — F341 Dysthymic disorder: Secondary | ICD-10-CM | POA: Diagnosis present

## 2011-05-06 DIAGNOSIS — E8779 Other fluid overload: Secondary | ICD-10-CM | POA: Diagnosis not present

## 2011-05-06 DIAGNOSIS — J9620 Acute and chronic respiratory failure, unspecified whether with hypoxia or hypercapnia: Secondary | ICD-10-CM | POA: Diagnosis not present

## 2011-05-06 DIAGNOSIS — F17200 Nicotine dependence, unspecified, uncomplicated: Secondary | ICD-10-CM | POA: Diagnosis present

## 2011-05-06 DIAGNOSIS — J441 Chronic obstructive pulmonary disease with (acute) exacerbation: Secondary | ICD-10-CM | POA: Diagnosis present

## 2011-05-06 DIAGNOSIS — C3410 Malignant neoplasm of upper lobe, unspecified bronchus or lung: Secondary | ICD-10-CM | POA: Diagnosis present

## 2011-05-06 LAB — CBC
Hemoglobin: 14.2 g/dL (ref 12.0–15.0)
MCHC: 33.9 g/dL (ref 30.0–36.0)
Platelets: 271 10*3/uL (ref 150–400)
RBC: 4.61 MIL/uL (ref 3.87–5.11)

## 2011-05-06 LAB — DIFFERENTIAL
Basophils Absolute: 0 10*3/uL (ref 0.0–0.1)
Basophils Relative: 0 % (ref 0–1)
Eosinophils Absolute: 0.1 10*3/uL (ref 0.0–0.7)
Monocytes Absolute: 1.1 10*3/uL — ABNORMAL HIGH (ref 0.1–1.0)
Neutro Abs: 10.7 10*3/uL — ABNORMAL HIGH (ref 1.7–7.7)
Neutrophils Relative %: 86 % — ABNORMAL HIGH (ref 43–77)

## 2011-05-06 LAB — BASIC METABOLIC PANEL
Calcium: 9 mg/dL (ref 8.4–10.5)
GFR calc non Af Amer: >60 mL/min (ref >60)
Glucose, Bld: 96 mg/dL (ref 70–99)
Potassium: 3.5 mEq/L (ref 3.5–5.1)
Sodium: 139 mEq/L (ref 135–145)

## 2011-05-07 ENCOUNTER — Emergency Department (HOSPITAL_COMMUNITY): Payer: Medicare Other

## 2011-05-07 DIAGNOSIS — L02219 Cutaneous abscess of trunk, unspecified: Secondary | ICD-10-CM

## 2011-05-07 DIAGNOSIS — L03319 Cellulitis of trunk, unspecified: Secondary | ICD-10-CM

## 2011-05-07 DIAGNOSIS — J441 Chronic obstructive pulmonary disease with (acute) exacerbation: Secondary | ICD-10-CM

## 2011-05-07 DIAGNOSIS — IMO0002 Reserved for concepts with insufficient information to code with codable children: Secondary | ICD-10-CM

## 2011-05-07 DIAGNOSIS — J9610 Chronic respiratory failure, unspecified whether with hypoxia or hypercapnia: Secondary | ICD-10-CM

## 2011-05-07 DIAGNOSIS — C3410 Malignant neoplasm of upper lobe, unspecified bronchus or lung: Secondary | ICD-10-CM

## 2011-05-07 LAB — BASIC METABOLIC PANEL
CO2: 24 mEq/L (ref 19–32)
Calcium: 8.1 mg/dL — ABNORMAL LOW (ref 8.4–10.5)
Chloride: 104 mEq/L (ref 96–112)
Glucose, Bld: 133 mg/dL — ABNORMAL HIGH (ref 70–99)
Potassium: 2.9 mEq/L — ABNORMAL LOW (ref 3.5–5.1)
Sodium: 138 mEq/L (ref 135–145)

## 2011-05-07 LAB — CBC
Hemoglobin: 12.9 g/dL (ref 12.0–15.0)
MCH: 30.4 pg (ref 26.0–34.0)
Platelets: 229 10*3/uL (ref 150–400)
RBC: 4.24 MIL/uL (ref 3.87–5.11)
WBC: 9.4 10*3/uL (ref 4.0–10.5)

## 2011-05-07 LAB — MRSA PCR SCREENING: MRSA by PCR: POSITIVE — AB

## 2011-05-07 LAB — POCT I-STAT 3, ART BLOOD GAS (G3+)
O2 Saturation: 92 %
pCO2 arterial: 30.8 mmHg — ABNORMAL LOW (ref 35.0–45.0)
pH, Arterial: 7.439 — ABNORMAL HIGH (ref 7.350–7.400)
pO2, Arterial: 60 mmHg — ABNORMAL LOW (ref 80.0–100.0)

## 2011-05-07 MED ORDER — IOHEXOL 300 MG/ML  SOLN
90.0000 mL | Freq: Once | INTRAMUSCULAR | Status: AC | PRN
  Administered 2011-05-07: 90 mL via INTRAVENOUS

## 2011-05-07 NOTE — H&P (Signed)
[name], [name]               ACCOUNT NO.:  [id number]  MEDICAL RECORD NO.:  [id number]  LOCATION:  [facility]                         FACILITY:  [facility]  PHYSICIAN:  Gery Pray, MD      DATE OF BIRTH:  August 12, 1950  DATE OF ADMISSION:  05/06/2011 DATE OF DISCHARGE:                             HISTORY & PHYSICAL   PRIMARY CARE PHYSICIAN:  Bufford Spikes, DO.  ONCOLOGIST:  Lurline Hare, MD.  CODE STATUS:  DNR.    The patient goes to team I.  CHIEF COMPLAINT:  Pain on the left arm.  HISTORY OF PRESENT ILLNESS:  This is a 60 year old female with multiple medical issues include chronic respiratory failure, COPD, ongoing tobacco abuse and lung cancer.  She states that on Wednesday she started feeling some discomfort under left axilla.  She states that it is just continued to become more painful.  She is unable to hold her left arm up more than 100 degrees.  She reports fevers, chills.  No nausea, no vomiting.  She finally came to the ER today because of ongoing pain.  On my interview, the patient is quite short of breath.  She states that she is chronically short of breath.  She has chronic respiratory failure, COPD, and lung cancer.  She is wheezing.  She states this is usual for her.  She states she has a chronic cough.  The patient's lung cancer was diagnosed in May 2012.  She had 5 rounds of radiation therapy.  She has been told that her cancer is in remission.  No chemo, no surgery was needed.  History is obtained from the patient.   The patient smelled strongly of tobacco.  REVIEW OF SYSTEMS:  All 10-point systems reviewed are negative except as noted in HPI.  PAST MEDICAL HISTORY: 1. COPD. 2. Chronic respiratory failure. 3. Anxiety. 4. Avascular necrosis of the bone. 5. Chronic pain. 6. Depression. 7. Headaches. 8. Lung cancer. 9. Neurofibromatosis. 10.Osteoporosis.  PAST SURGICAL HISTORY:  Back surgery, cholecystectomy, hysterectomy, tonsillectomy, tubal  ligation.  MEDICATIONS: 1. Seroquel 100 mg p.o. for sleep. 2. Xanax 0.5 mg p.o. q.i.d.  ALLERGIES:  The patient is allergic to PENICILLIN.  SOCIAL HISTORY:  Positive tobacco, negative for alcohol or illicit drugs.  The patient is on home oxygen 4 liters.  She lives at home with her husband who is present at her bedside.  FAMILY HISTORY:  Significant for coronary artery disease, hypertension, diabetes mellitus.  PHYSICAL EXAMINATION:  VITAL SIGNS:  Blood pressure 127/79, pulse 110, respirations 20, temperature 98.8, sat 96% on oxygen. GENERAL:  Anxious, dyspneic-appearing female. HEENT:  EYES:  Pale conjunctivae.  PERRL.  No scleral icterus.  ENT: Dry oral mucosa.  Trachea midline. NECK:  Supple.  No thyromegaly. LUNGS:  Wheezing throughout.  Mild use of accessory muscles. CARDIOVASCULAR:  Regular rate and rhythm without murmurs, regurg, or gallops.  Tachycardic.  No JVD. ABDOMEN:  Soft, positive bowel sounds, nontender, nondistended.  No organomegaly. NEUROLOGIC:  Cranial nerves II through XII grossly intact.  Sensation intact. MUSCULOSKELETAL:  Strength IS 5/5 in all extremities.  No clubbing, cyanosis, or edema. SKIN:  The patient has evidence of neurofibromatosis nodules all over from  face to legs and back.  On the patient's left axilla, she also has a cellulitic area that is also somewhat mass like in quality.  LABORATORY DATA:  Ultrasound of left axilla.  No evidence of mass or abscess in the symptomatic area on the left arm.  Sodium 139, potassium 3.5, chloride 102, CO2 26, glucose 96, BUN 13, creatinine 0.68.  White blood count 12.6, hemoglobin 14.2, platelets 217.  CT chest showed the skin thickening and infiltration in the subcutaneous fat of the left axilla consistent with cellulitis.  No abscess demonstrated.  The patient has stable indeterminate Spiculated nodule in the superior segment of the left lower lung that measures 11 mm.  Chest x-ray shows chronic  interstitial fibrosis for the long.  No acute changes in the study.  ASSESSMENT AND PLAN: 1. Left axillary cellulitis.  The patient will be admitted.  We will     obtain blood cultures, antibiotics will be ordered as well as     p.r.n. pain medications.  Imaging studies read as no focal     collection of fluid or abscess.  Monitor. 2. Chronic respiratory failure. 3. Chronic obstructive pulmonary disease acute on chronic exacerbation. 4. Lung cancer. 5. Tobacco abuse 6. Marked anxiety.    We will continue oxygen.  We will order nicotine patch.  We will order    nebulizer treatments.  Solu-Medrol will be ordered q.8 h.      The patient states her lung cancer is in remission, howeever, imaging    read as smaller spiculated mass. With the spiculation, I am unclear if cancer is in remission.    We will defer to AM team need to touch base with Oncology. 7. Depression, neurofibromatosis.  Resume home medications.          ______________________________ Gery Pray, MD     DC/MEDQ  D:  05/07/2011  T:  05/07/2011  Job:  [id number]  Electronically Signed by Gery Pray MD on 05/07/2011 06:11:47 AM

## 2011-05-08 DIAGNOSIS — J9610 Chronic respiratory failure, unspecified whether with hypoxia or hypercapnia: Secondary | ICD-10-CM

## 2011-05-08 DIAGNOSIS — J441 Chronic obstructive pulmonary disease with (acute) exacerbation: Secondary | ICD-10-CM

## 2011-05-08 DIAGNOSIS — C3410 Malignant neoplasm of upper lobe, unspecified bronchus or lung: Secondary | ICD-10-CM

## 2011-05-08 LAB — BASIC METABOLIC PANEL
BUN: 14 mg/dL (ref 6–23)
CO2: 26 mEq/L (ref 19–32)
Chloride: 103 mEq/L (ref 96–112)
Creatinine, Ser: 0.66 mg/dL (ref 0.50–1.10)
GFR calc Af Amer: >90 mL/min (ref >90)
Potassium: 3.8 mEq/L (ref 3.5–5.1)

## 2011-05-08 LAB — CBC
HCT: 36.8 % (ref 36.0–46.0)
MCV: 90.4 fL (ref 78.0–100.0)
RBC: 4.07 MIL/uL (ref 3.87–5.11)
RDW: 16.5 % — ABNORMAL HIGH (ref 11.5–15.5)
WBC: 8.9 10*3/uL (ref 4.0–10.5)

## 2011-05-08 LAB — GLUCOSE, CAPILLARY: Glucose-Capillary: 174 mg/dL — ABNORMAL HIGH (ref 70–99)

## 2011-05-10 LAB — CBC
Hemoglobin: 10.7 g/dL — ABNORMAL LOW (ref 12.0–15.0)
MCV: 92.7 fL (ref 78.0–100.0)
Platelets: 225 10*3/uL (ref 150–400)
RBC: 3.55 MIL/uL — ABNORMAL LOW (ref 3.87–5.11)
WBC: 7.1 10*3/uL (ref 4.0–10.5)

## 2011-05-10 LAB — BASIC METABOLIC PANEL
CO2: 27 mEq/L (ref 19–32)
Chloride: 106 mEq/L (ref 96–112)
Creatinine, Ser: 0.6 mg/dL (ref 0.50–1.10)

## 2011-05-11 LAB — POCT CARDIAC MARKERS
CKMB, poc: 2 ng/mL (ref 1.0–8.0)
Troponin i, poc: <0.05 ng/mL (ref 0.00–0.09)

## 2011-05-11 LAB — DIFFERENTIAL
Basophils Absolute: 0 10*3/uL (ref 0.0–0.1)
Lymphocytes Relative: 14 % (ref 12–46)
Neutro Abs: 6.5 10*3/uL (ref 1.7–7.7)
Neutrophils Relative %: 78 % — ABNORMAL HIGH (ref 43–77)

## 2011-05-11 LAB — BASIC METABOLIC PANEL
BUN: 12 mg/dL (ref 6–23)
Calcium: 9.6 mg/dL (ref 8.4–10.5)
GFR calc non Af Amer: >60 mL/min (ref >60)
Glucose, Bld: 105 mg/dL — ABNORMAL HIGH (ref 70–99)
Sodium: 143 mEq/L (ref 135–145)

## 2011-05-11 LAB — WOUND CULTURE

## 2011-05-11 LAB — CBC
Platelets: 249 10*3/uL (ref 150–400)
RDW: 15 % (ref 11.5–15.5)

## 2011-05-11 NOTE — Op Note (Signed)
  NAMEANASTACIA, REINECKE               ACCOUNT NO.:  [id number]  MEDICAL RECORD NO.:  [id number]  LOCATION:  2604                         FACILITY:  [facility]  PHYSICIAN:  Gabrielle Dare. Janee Morn, M.D.DATE OF BIRTH:  19-Nov-1950  DATE OF PROCEDURE:  05/09/2011 DATE OF DISCHARGE:                              OPERATIVE REPORT   PREOPERATIVE DIAGNOSIS:  Left axillary abscess.  POSTOPERATIVE DIAGNOSIS:  Left axillary abscess.  PROCEDURE:  Incision and drainage of left axillary abscess at bedside.  SURGEON:  Gabrielle Dare. Janee Morn, M.D.  ANESTHESIA:  Local.  HISTORY OF PRESENT ILLNESS:  Ms. Windhorst is a 60 year old female who has been followed on our service for left axillary cellulitis.  She has increasing pain there at this morning.  On exam, she had increasing erythema and fluctuance, so we are proceeding with bedside incision and drainage.  PROCEDURE IN DETAIL:  Informed consent was obtained.  The patient remains monitored in the step-down unit.  We did time-out procedure. The left axilla was prepped and draped in sterile fashion.  2% lidocaine with epinephrine was injected along the area of most fluctuance.  A transverse incision of couple of centimeters in size was made with prompt evacuation spontaneously of a large amount of pus, some loculations were broken up down inside the abscess.  About 150 mL of pus was removed.  This was sent for culture.  The wound was irrigated out and packed with iodoform gauze.  Hemostasis was obtained with pressure and then a bulky gauze dressing was applied.  The patient tolerated the procedure well.     Gabrielle Dare Janee Morn, M.D.     BET/MEDQ  D:  05/09/2011  T:  05/09/2011  Job:  [id number]  Electronically Signed by Violeta Gelinas M.D. on 05/11/2011 02:30:45 PM

## 2011-05-12 LAB — BASIC METABOLIC PANEL
BUN: 13 mg/dL (ref 6–23)
Calcium: 8.9 mg/dL (ref 8.4–10.5)
GFR calc Af Amer: >90 mL/min (ref >90)
GFR calc non Af Amer: >90 mL/min (ref >90)
Potassium: 4.4 mEq/L (ref 3.5–5.1)
Sodium: 142 mEq/L (ref 135–145)

## 2011-05-12 LAB — CBC
HCT: 36.4 % (ref 36.0–46.0)
MCHC: 32.4 g/dL (ref 30.0–36.0)
RDW: 16.3 % — ABNORMAL HIGH (ref 11.5–15.5)

## 2011-05-13 LAB — CULTURE, BLOOD (ROUTINE X 2)
Culture  Setup Time: 201209301128
Culture: NO GROWTH

## 2011-05-16 NOTE — Discharge Summary (Signed)
[name], [name]               ACCOUNT NO.:  [id number]  MEDICAL RECORD NO.:  [id number]  LOCATION:  5022                         FACILITY:  [facility]  PHYSICIAN:  Lonia Blood, M.D.       DATE OF BIRTH:  1950-09-15  DATE OF ADMISSION:  05/06/2011 DATE OF DISCHARGE:  05/12/2011                              DISCHARGE SUMMARY   PRIMARY CARE PHYSICIAN:  Bufford Spikes, DO, with [facility].  DISCHARGE DIAGNOSES: 1. Left axillary methicillin-resistant Staphylococcus aureus abscess     and cellulitis status post incision and debridement by Dr. Violeta Gelinas. 2. Severe pulmonary fibrosis with chronic respiratory failure. 3. 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 4. 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 5. Chronic anxiety. 6. Neurofibromatosis. 7. Depression. 8. Osteoporosis.  DISCHARGE MEDICATIONS: 1. Alprazolam 0.5 mg every 6 hours as needed for anxiety. 2. Clindamycin 300 mg 3 times a day for 1 week. 3. DuoNeb inhaled three times a day. 4. Hydroxyzine 25 mg half-tablet every 4 hours as needed for itching. 5. Lisinopril 5 mg daily. 6. Methadone 7.5 mg every 8 hours. 7. Remeron 7.5 mg 1-2 tablets at bedtime. 8. Nicotine patch 21 mg daily. 9. Percocet 7.5/325 one tablet every 6 hours as needed for pain. 10.Seroquel 100 mg at bedtime.  CONDITION ON DISCHARGE:  [name] is discharged in good condition. She will follow up with primary care facility, Dr. Bufford Spikes.  She will follow up with Dr. Jetty Duhamel, her pulmonologist.  She will follow up with Dr. Violeta Gelinas, her surgeon.  The patient will have wound care done at home with daily packing with sterile gauze.  PROCEDURE THIS ADMISSION:  The patient underwent a CT chest with intravenous contrast which was negative for pulmonary emboli, positive for fibrosis emphysematous changes, both lungs, spiculated nodule in  the left lower lung, positive also for an abscess into the left axilla.  CONSULTATION THIS ADMISSION:  The patient was in consultation by Pulmonary Medicine and [facility].  HOSPITAL COURSE: 1. Axillary abscess.  [name] was admitted on May 07, 2011,     from the emergency room with the axillary abscess cellulitis.  She     was started on intravenous antibiotics from admission with     vancomycin.  Eventually, she underwent incision and debridement of     the abscess on May 09, 2011, and underwent re-excision and     drainage of the abscess on May 12, 2011. Cultures sent from the     site by the operating surgeon, Dr. Violeta Gelinas, revealed the     presence of methicillin-resistant Staphylococcus aureus.  The     isolate was sensitive to clindamycin and did not show inducible     clindamycin resistance Levitra.  [name] was treated with     intravenous vancomycin throughout her admission for a total 7 days.     By the time of the discharge, she was already having improved     cellulitis with wounds being packed daily.  She was switched to  oral clindamycin to complete one more week of treatment. 2. 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  Ms.     [name] was kept on oxygen throughout this admission.  Her oxygen     was titred up to 5 liters per 24 hours.  She was started on DuoNebs     throughout the day and to continue that at home.  She seems to have     quite an end-stage pulmonary condition.  I am sure Dr. Jetty Duhamel is keeping close eye on this. 3. Chronic pain syndrome.  [name] has experienced more acute     pain now with her axillary abscess.  I have titrated up her     methadone 7.5 mg three times a day.     Lonia Blood, M.D.     SL/MEDQ  D:  05/14/2011  T:  05/14/2011  Job:  [id number]  cc:   Bufford Spikes, DO Gabrielle Dare. Janee Morn, M.D. Clinton D. Maple Hudson, MD, [facility], FACP  Electronically Signed by Lonia Blood  M.D. on 05/16/2011 65:78:46 PM

## 2011-05-16 NOTE — Consult Note (Signed)
[name], [name]               ACCOUNT NO.:  [id number]  MEDICAL RECORD NO.:  [id number]  LOCATION:  2604                         FACILITY:  [facility]  PHYSICIAN:  Maisie Fus A. Pauline Pegues, M.D.DATE OF BIRTH:  Feb 28, 1951  DATE OF CONSULTATION:  05/07/2011 DATE OF DISCHARGE:                                CONSULTATION   CHIEF COMPLAINT:  Swelling, left axilla.  REQUESTING PHYSICIAN:  Hartley Barefoot, MD  HISTORY OF PRESENT ILLNESS:  The patient is a 60 year old female with end-stage chronic respiratory failure, COPD, ongoing tobacco use, and lung cancer admitted due to the swelling and redness to the left axilla, started 3 days ago.  The pain is constant, made worse with pushing on the area in the left axilla.  It is associated with redness and fever. Denies any nausea or vomiting.  She is currently short of breath on chronic home O2.  She has a history of neurofibromatosis and is also on chronic steroids.  Ultrasound was done of the left axilla, which was unremarkable.  CT of the chest was done, which showed some induration in the left axilla, but no abscesses.  She was admitted yesterday. Antibiotics started yesterday.  We were consulted today to evaluate for abscess in the left axilla.  PAST MEDICAL HISTORY: 1. COPD. 2. Chronic respiratory failure. 3. Lung cancer. 4. Anxiety. 5. Avascular necrosis of the bone. 6. Depression. 7. Headache. 8. Neurofibromatosis.  PAST SURGICAL HISTORY: 1. Back surgery. 2. Hysterectomy. 3. Tonsillectomy. 4. Tubal ligation. 5. Cholecystectomy.  MEDICATIONS:  Seroquel and Xanax  ALLERGIES:  PENICILLIN.  FAMILY HISTORY:  Positive for hypertension, diabetes, and Parkinson disease.  SOCIAL HISTORY:  She smokes, lives at home, is on 4 liters of oxygen.  REVIEW OF SYSTEMS:  Positive for shortness of breath, otherwise negative x15.  PHYSICAL EXAMINATION:  VITAL SIGNS:  Temperature is 99.1, pulse 106, blood pressure 124/70. GENERAL  APPEARANCE:  White female in mild distress, short of breath. Multiple cutaneous nodules noted. HEENT:  Multiple cutaneous nodules on face.  No jaundice.  Mouth is moist. NECK:  Supple.  Trachea midline. PULMONARY:  She was short of breath, wheezing, which is easily heard. Chest work of breathing is moderate. SKIN:  Multiple nodules noted.  Left axilla has 3 x 4 cm area of erythema, but not very fluctuant.  It is not very hard.  The erythema is mild.  It is located just at the junction of the anterior and posterior axillary hairline regions. NEUROLOGIC:  Glasgow coma scale is 15.  Motor and sensory function are intact.  LABORATORY STUDIES:  Show a white count of 9400, hemoglobin is 12.9, and platelet count is 229,000.  Electrolytes within normal limits except for potassium of 2.9, glucose 123.  CT scan reviewed, which shows no evidence of abscess.  An ultrasound was done in the emergency room, apparently report was negative for abscess.  IMPRESSION: 1. Axillary cellulitis. 2. Multiple severe medical problems.  PLAN:  IV antibiotics for now.  We will recheck her in order to see if an abscess comes to a head.  If it does, we can drain it, otherwise follow her for now.  Thank you for this consultation.  Damarea Merkel A. Charley Miske, M.D.     TAC/MEDQ  D:  05/07/2011  T:  05/07/2011  Job:  [id number]  Electronically Signed by Harriette Bouillon M.D. on 05/16/2011 07:56:31 AM

## 2011-05-17 LAB — I-STAT 8, (EC8 V) (CONVERTED LAB)
BUN: 12
Chloride: 110
Glucose, Bld: 96
Hemoglobin: 19 — ABNORMAL HIGH
Potassium: 4.3
Sodium: 140
pH, Ven: 7.346 — ABNORMAL HIGH

## 2011-05-17 LAB — RAPID URINE DRUG SCREEN, HOSP PERFORMED
Barbiturates: NOT DETECTED
Cocaine: NOT DETECTED
Opiates: NOT DETECTED

## 2011-05-17 LAB — DIFFERENTIAL
Basophils Absolute: 0.1
Basophils Relative: 1
Monocytes Relative: 6
Neutro Abs: 5.7
Neutrophils Relative %: 79 — ABNORMAL HIGH

## 2011-05-17 LAB — CBC
MCHC: 34.6
Platelets: 363
RBC: 5.54 — ABNORMAL HIGH

## 2011-05-17 LAB — POCT CARDIAC MARKERS
Myoglobin, poc: 64.9
Operator id: 294501

## 2011-05-17 LAB — POCT I-STAT CREATININE: Operator id: 294501

## 2011-05-17 LAB — D-DIMER, QUANTITATIVE: D-Dimer, Quant: 0.43

## 2011-05-18 LAB — COMPREHENSIVE METABOLIC PANEL
ALT: 19
AST: 20
Alkaline Phosphatase: 124 — ABNORMAL HIGH
CO2: 22
Calcium: 9.1
GFR calc Af Amer: >60
Glucose, Bld: 95
Potassium: 3.8
Sodium: 140
Total Protein: 7.5

## 2011-05-18 LAB — I-STAT 8, (EC8 V) (CONVERTED LAB)
Acid-base deficit: 5 — ABNORMAL HIGH
Bicarbonate: 20.2
Glucose, Bld: 97
TCO2: 21
pCO2, Ven: 36.6 — ABNORMAL LOW
pH, Ven: 7.35 — ABNORMAL HIGH

## 2011-05-18 LAB — DIFFERENTIAL
Basophils Absolute: 0
Basophils Relative: 0
Eosinophils Absolute: 0
Eosinophils Relative: 0
Lymphs Abs: 0.8
Neutrophils Relative %: 84 — ABNORMAL HIGH

## 2011-05-18 LAB — CBC
HCT: 46.3 — ABNORMAL HIGH
MCV: 93.8
Platelets: 323
RDW: 13.2
WBC: 8.8

## 2011-05-18 LAB — URINALYSIS, ROUTINE W REFLEX MICROSCOPIC
Bilirubin Urine: NEGATIVE
Glucose, UA: NEGATIVE
Ketones, ur: NEGATIVE
Specific Gravity, Urine: 1.031 — ABNORMAL HIGH
pH: 5

## 2011-05-18 LAB — POCT I-STAT CREATININE
Creatinine, Ser: 0.7
Operator id: 272551

## 2011-05-22 LAB — I-STAT 8, (EC8 V) (CONVERTED LAB)
Acid-Base Excess: 1
BUN: 16
Bicarbonate: 27.2 — ABNORMAL HIGH
Chloride: 105
Glucose, Bld: 95
HCT: 49 — ABNORMAL HIGH
Hemoglobin: 16.7 — ABNORMAL HIGH
Operator id: 279831
Potassium: 5
Sodium: 136
TCO2: 29
pCO2, Ven: 47.4
pH, Ven: 7.366 — ABNORMAL HIGH

## 2011-05-22 LAB — DIFFERENTIAL
Basophils Absolute: 0
Eosinophils Relative: 0
Lymphocytes Relative: 9 — ABNORMAL LOW
Neutrophils Relative %: 84 — ABNORMAL HIGH

## 2011-05-22 LAB — COMPREHENSIVE METABOLIC PANEL WITH GFR
ALT: 132 — ABNORMAL HIGH
AST: 22
Albumin: 4.1
CO2: 28
Calcium: 9.6
Creatinine, Ser: 0.97
GFR calc Af Amer: >60
GFR calc non Af Amer: 59 — ABNORMAL LOW
Sodium: 138
Total Protein: 7.9

## 2011-05-22 LAB — URINALYSIS, ROUTINE W REFLEX MICROSCOPIC
Ketones, ur: NEGATIVE
Nitrite: NEGATIVE
Protein, ur: NEGATIVE
Urobilinogen, UA: 0.2

## 2011-05-22 LAB — POCT URINALYSIS DIP (DEVICE)
Nitrite: NEGATIVE
Operator id: 247071
Protein, ur: NEGATIVE
Urobilinogen, UA: 0.2
pH: 5.5

## 2011-05-22 LAB — COMPREHENSIVE METABOLIC PANEL
Alkaline Phosphatase: 199 — ABNORMAL HIGH
BUN: 15
Chloride: 101
Glucose, Bld: 102 — ABNORMAL HIGH
Potassium: 5.4 — ABNORMAL HIGH
Total Bilirubin: 0.6

## 2011-05-22 LAB — LIPASE, BLOOD: Lipase: 18

## 2011-05-22 LAB — CBC
HCT: 44.3
Platelets: 278
RDW: 14.5 — ABNORMAL HIGH

## 2011-05-22 LAB — POCT I-STAT CREATININE
Creatinine, Ser: 1.1
Operator id: 279831

## 2011-05-22 NOTE — Consult Note (Signed)
[name], [name]               ACCOUNT NO.:  [id number]  MEDICAL RECORD NO.:  [id number]  LOCATION:  2604                         FACILITY:  [facility]  PHYSICIAN:  Barbaraann Share, MD,FCCPDATE OF BIRTH:  1951-01[birth year]  DATE OF CONSULTATION:  05/07/2011 DATE OF DISCHARGE:                                CONSULTATION   REFERRING PHYSICIAN:  [facility].  HISTORY OF PRESENT ILLNESS:  The patient is a 60 year old white female who I have been asked to see for worsening shortness of breath.  She is known to our practice with moderate-to-severe emphysema, ongoing smoking, and recent diagnosis of left upper lobe adenocarcinoma for which she has received radiation therapy.  The patient was admitted by the Hospitalist Service with axillary cellulitis, but this a.m. was felt to have worsening shortness of breath.  The patient admits that 1 week prior to admission she had some worsening dyspnea on exertion above her usual baseline.  She has also had some cough with purulent mucus.  The patient was given a nebulizer treatment this a.m. and now feels that she is back to her usual baseline.  Her oxygen saturations are better than normal and she feels in no distress at this time.  PAST MEDICAL HISTORY:  Significant for; 1. Moderate-to-severe emphysema, chronic respiratory failure. 2. Anxiety. 3. Avascular necrosis. 4. History of depression. 5. History of neurofibromatosis 6. History of osteoporosis. 7. History of adenocarcinoma of left upper lobe and status post     radiation therapy. 8. History of ongoing tobacco abuse.  ALLERGIES:  The patient has an allergy to PENICILLIN.  SOCIAL HISTORY:  She has a long history of tobacco use at least a pack a day and continues to do so.  She lives at home with her husband.  FAMILY HISTORY:  Remarkable for coronary artery disease, hypertension, and diabetes.  REVIEW OF SYSTEMS:  Positive for cough, congestion, purulent  mucus, worsening shortness of breath, redness in axilla, anxiety, no lower extremity edema.  The rest of a 10-point review of systems was reviewed and negative other than those listed above.  PHYSICAL EXAMINATION:  GENERAL:  She is a well-developed female who is in no acute distress currently. VITAL SIGNS:  Blood pressure is 120/70, pulse 102, temperature is afebrile, and 4 liters sat is 98%. HEENT:  Pupils equal, round, reactive to light and accommodation. Extraocular muscles are intact.  Nares are patent without discharge. Oropharynx is clear. NECK:  Supple without JVD or lymphadenopathies.  There is no palpable thyromegaly. CHEST:  Decreased breath sounds but adequate airflow, no wheezes, or rhonchi noted. CARDIAC:  Minimally tachycardic regular rhythm. ABDOMEN:  Soft, nontender with good bowel sounds. SKIN:  Changes do show that of neurofibromas overall, no cyanosis noted. LOWER EXTREMITIES:  No edema, pulses intact distally. NEUROLOGIC:  She is alert and oriented.  Moves all 4 extremities with no obvious motor deficits.  LABORATORY DATA:  Chest x-ray and CT chest was reviewed and shows her known emphysema and fibrosis with no superimposed focal pneumonia or evidence for radiation pneumonitis.  IMPRESSION:  Moderate-to-severe emphysema with mild acute exacerbation. She is much better after treatment with a nebulized bronchodilator. Currently, she has no  increased work of breathing.  I agree with neb treatments as at home, along with the steroid pulse.  Also agree with antibiotics in the need to control her anxiety.  It should be noted the patient is a do not intubate.  SUGGESTIONS: 1. Schedule with p.r.n. bronchodilators. 2. Agree with steroids, but would change to p.o. soon.  Agree with     antibiotics. 3. Anxiolytics 4. I do not think the patient needs to be in the Step-Down Unit given     her current state.  I will let Dr. Maple Hudson, know that she is in     hospital in the  a.m.     Barbaraann Share, MD,FCCP     KMC/MEDQ  D:  05/07/2011  T:  05/07/2011  Job:  [id number]  Electronically Signed by Marcelyn Bruins MDFCCP on 05/22/2011 01:21:51 PM

## 2011-05-23 NOTE — Op Note (Signed)
  NAMEHAIZLEE, [name]               ACCOUNT NO.:  [id number]  MEDICAL RECORD NO.:  [id number]  LOCATION:  5022                         FACILITY:  [facility]  PHYSICIAN:  Gabrielle Dare. Janee Morn, M.D.DATE OF BIRTH:  09/26/1950  DATE OF PROCEDURE:  05/12/2011 DATE OF DISCHARGE:                              OPERATIVE REPORT   PREOPERATIVE DIAGNOSIS:  Left axillary abscess with methicillin- resistant Staphylococcus aureus.  POSTOPERATIVE DIAGNOSIS:  Left axillary abscess with methicillin- resistant Staphylococcus aureus.  PROCEDURE:  Incision and drainage of left axillary abscess.  SURGEON:  Gabrielle Dare. Janee Morn, MD  HISTORY OF PRESENT ILLNESS:  Ms. Holohan is a 60 year old female who was admitted with left axillary cellulitis.  She underwent bedside incision and drainage of her left axilla on May 09, 2011 with return of about 150 mL of pus.  Cultures had grown MRSA.  She is on vancomycin. Yesterday, she developed some new redness anterior to the previous I and D site, today that is still present with some additional fluctuance in the areas.  We are proceeding with followup incision and drainage of this new site.  PROCEDURE IN DETAIL:  Informed consent was obtained, the patient was identified, and was on the orthopedic floor.  Time-out procedure was done.  The left axilla was prepped and draped in sterile fashion. Lidocaine 2% with epinephrine was injected over this erythematous fluctuant area and it was anterior to the previous I and D site. Incision was made with spontaneous release of 10 mL or so of pus.  The wound was thoroughly irrigated out.  Hemostasis was obtained with pressure.  Wound was then packed with 1/4-inch Iodoform gauze.  At this time, the other I and D sites packing was changed as well and that wound was cleaned out.  A bulky sterile gauze was applied and she tolerated this procedure well without apparent complication.     Gabrielle Dare Janee Morn, M.D.     BET/MEDQ   D:  05/12/2011  T:  05/12/2011  Job:  [id number]  Electronically Signed by Violeta Gelinas M.D. on 05/23/2011 01:27:50 PM

## 2011-05-25 ENCOUNTER — Ambulatory Visit: Payer: Medicare Other | Admitting: Internal Medicine

## 2011-05-30 NOTE — H&P (Signed)
  [name], [name]               ACCOUNT NO.:  [id number]  MEDICAL RECORD NO.:  [id number]           PATIENT TYPE:  I  LOCATION:  FOOT                         FACILITY:  [facility]  PHYSICIAN:  Joanne Gavel, M.D.        DATE OF BIRTH:  1951-01-19  DATE OF ADMISSION:  10/04/2010 DATE OF DISCHARGE:                             HISTORY & PHYSICAL   CHIEF COMPLAINT:  Ulcer on left heel.  HISTORY OF PRESENT ILLNESS:  This patient is practically bedridden from severe back pain.  She developed an ulceration on her left heel approximately 3 weeks ago, has been seen by several doctors, being treated with application of Silvadene.  This at first did have appearance of a blister.  The patient is practically bedridden because of multiple back operations.  She has von Recklinghausen disease but does not believe that she has tumor in the back.  She thinks that herniated disks have been the cause of her discomfort.  PAST MEDICAL HISTORY:  Emphysema.  She is on continuous oxygen.  PAST SURGICAL HISTORY:  Back surgery, herniated disks, appendectomy, hysterectomy, and cholecystectomy.  MEDICATIONS:  Doxycycline, Percocet, and oxygen.  ALLERGIES:  PENICILLIN causes hives.  REVIEW OF SYSTEMS:  Does not appear contributory.  Cigarettes; she smokes one pack per day and has for more than 30 years. Alcohol; none.  PHYSICAL EXAMINATION:  VITAL SIGNS:  Temperature 98.1, pulse 94, respirations 22, and blood pressure 144/82. GENERAL APPEARANCE:  The patient has von Recklinghausen tumors, visible all over face and torso and extremities.  No neurological abnormality. CHEST:  Clear. HEART:  Regular rhythm. ABDOMEN:  Benign to cursory examination. EXTREMITIES:  Examination of the foot reveals a 2.9 x 3.0 circular decubitus wound on the left heel.  This is not quite full thickness but it looks like it will develop into a full-thickness eschar very soon. Some necrotic tissue was sharply debrided today.  The  patient has weakly palpable pulses and is scheduled for vascular studies.  IMPRESSION:  Decubitus left heel.  PLAN:  Santyl, hydrogel, strict avoidance of pressure.  We will get an x- ray of the foot to make sure there is not a foreign body or some abnormality of the calcaneus and we are awaiting vascular studies.  We will see her next week.     Joanne Gavel, M.D.     RA/MEDQ  D:  10/04/2010  T:  10/05/2010  Job:  [id number]  cc:   Dr. Renato Gails  Electronically Signed by Joanne Gavel M.D. on 05/30/2011 08:49:42 AM

## 2011-06-12 ENCOUNTER — Telehealth: Payer: Self-pay | Admitting: Internal Medicine

## 2011-06-12 MED ORDER — PREDNISONE 10 MG PO TABS: ORAL_TABLET | ORAL | Status: DC

## 2011-06-12 NOTE — Telephone Encounter (Signed)
appt set for tomorrow at 3:30pm. rx sent.Carron Curie, CMA

## 2011-06-12 NOTE — Telephone Encounter (Signed)
Called [name] at the number given and was told I called the wrong number. Called and spoke with pt. She is c/o increased SOB x several days. She states that even while wearing o2 she gets OOB just walking several steps. She states that she does not feel like needs to go to ED, but needs "different meds or something and I will be fine". She denies any fever, CP, cough, wheeze. Would like ov with CDY but nothing open. Please advise if we can work her in or if CDY okay with her seeing another doc. Thanks!

## 2011-06-12 NOTE — Telephone Encounter (Signed)
Per CY-? Missed appt with him; offer patient Prednisone 10 mg #20 take 4 x 2 days,3 x 2 days, 2 x 2 days, 1 x 2 days, then stop no refills. Also,please have patient to come in and see CY tomorrow at 3:30 pm or with TP.

## 2011-06-13 ENCOUNTER — Encounter: Payer: Self-pay | Admitting: Internal Medicine

## 2011-06-13 ENCOUNTER — Ambulatory Visit (INDEPENDENT_AMBULATORY_CARE_PROVIDER_SITE_OTHER): Payer: Medicare Other | Admitting: Internal Medicine

## 2011-06-13 VITALS — BP 110/68 | HR 88 | Ht 63.0 in | Wt 116.0 lb

## 2011-06-13 DIAGNOSIS — L02412 Cutaneous abscess of left axilla: Secondary | ICD-10-CM | POA: Insufficient documentation

## 2011-06-13 DIAGNOSIS — C3490 Malignant neoplasm of unspecified part of unspecified bronchus or lung: Secondary | ICD-10-CM

## 2011-06-13 DIAGNOSIS — J4489 Other specified chronic obstructive pulmonary disease: Secondary | ICD-10-CM

## 2011-06-13 DIAGNOSIS — F17200 Nicotine dependence, unspecified, uncomplicated: Secondary | ICD-10-CM

## 2011-06-13 DIAGNOSIS — IMO0002 Reserved for concepts with insufficient information to code with codable children: Secondary | ICD-10-CM

## 2011-06-13 DIAGNOSIS — J449 Chronic obstructive pulmonary disease, unspecified: Secondary | ICD-10-CM

## 2011-06-13 MED ORDER — DOXYCYCLINE HYCLATE 100 MG PO TABS: ORAL_TABLET | ORAL | Status: DC

## 2011-06-13 NOTE — Assessment & Plan Note (Signed)
She understands this cancer to be in remission and hopefully resolved.

## 2011-06-13 NOTE — Assessment & Plan Note (Signed)
  Home visiting nurse continues to care for this wound.

## 2011-06-13 NOTE — Assessment & Plan Note (Signed)
I again reviewed support options for trying to quit and maintained encouragement.

## 2011-06-13 NOTE — Progress Notes (Signed)
Patient ID: MADASYN HEATH, female    DOB: 31-Oct-1950, 60 y.o.   MRN: [id number]  HPI  12/09/10- 72 yoF smoker followed for COPD, tobacco abuse and lung nodule, still smoking. Complicating hx of extensive neurofibromatosis, depression, osteopenia. Last here August 11, 2010 after hosp twice for exacerbations of COPD, unable to afford meds. Oxygen dependent. During hosp CXR showed 1.4x1.0 LUL nodule.  Since last here, she had exacerbation with dyspnea while visiting sister in [place]. They admitted her to [facility] again 2-3 weeks ago- breathing better, but upped O2 to 5 L. They did needle bx of the lung nodule and told her POS for CANCER. She knows no more than that with no f/u planned at [facility].We are requesting records. She wants care here where she lives w/ husband. Very anxious- asks help for nerves- has taken xanax 1 mg in past w/o oversedation. Feels ok. Denies cough, phlegm, blood, pain or swelling. Easy DOE across room. Feet not swelling.   12/30/10- 59 yoF smoker followed for COPD, tobacco abuse , still smoking. Complicating hx of extensive neurofibromatosis, depression, osteopenia. Adenoca LUL/ XRT,  CAD, neurofibromatosis, meningioma Since last here, cytopath from [facility] confirmed needle lung bx adenoca. PET 13 suv w/ no mets. CT brain showed stable meningioma. We reviewed discussion at [facility] where recognition that this lesion is on major fissure means too much lung resection for her COPD. Instead we will refer for XRT. She is trying to quit smoking. Denies chest pain. Productive cough with light brownish sputum is routine for her. Denies blood, fever, nodes or breast nodule.  Asks help for sleep, saying that xanax isn't helping.   06/13/11-  59 yoF smoker followed for COPD, tobacco abuse , still smoking. Complicating hx of extensive neurofibromatosis, depression, osteopenia. 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 Has had flu vaccine. Still smoking about 10 cigarettes a day and we discussed ways to quit including transfer to electronic cigarettes. She remains on continuous oxygen 4 L per minute/Advanced. More short of breath in the last week and a half, coughing some green or yellow sputum and feeling heavy through the chest. Denies fever, sore throat, palpitations, edema, blood. Had a MRSA abscess left axilla surgically drained. Completed antibiotics 2 weeks ago. Home health nurses packing it. We called in a prednisone taper last night for bronchitic exacerbation.  Review of Systems See HPI Constitutional:   No-   weight loss, night sweats, fevers, chills, fatigue, lassitude. HEENT:   No-  headaches, difficulty swallowing, tooth/dental problems, sore throat,       No-  sneezing, itching, ear ache, nasal congestion, post nasal drip,  CV:  No-   chest pain, orthopnea, PND, swelling in lower extremities, anasarca,  dizziness, palpitations Resp: acute  shortness of breath with exertion or at rest.              Some   productive cough,  No non-productive cough,  No- coughing up of blood.              No-   change in color of mucus.  No- wheezing.   Skin: No-   rash or lesions. GI:  No-   heartburn, indigestion, abdominal pain, nausea, vomiting, diarrhea,                 change in bowel habits, loss of appetite GU: No-   dysuria, change in color of urine,  no urgency or frequency.  No- flank pain. MS:  No-   joint pain or swelling.  No- decreased range of motion.  No- back pain. Neuro-     nothing unusual Psych:  No- change in mood or affect. No depression or anxiety.  No memory loss.      Objective:   Physical Exam General- Alert, Oriented, Affect-appropriate, Distress- none acute; portable oxygen at 4 L, talkative  Skin- neurofibromatosis  Lymphadenopathy- none Head- atraumatic            Eyes- Gross vision intact, PERRLA, conjunctivae clear secretions            Ears- Hearing,  canals-normal            Nose- Clear, no-Septal dev, mucus, polyps, erosion, perforation             Throat- Mallampati II , mucosa clear , drainage- none, tonsils- atrophic Neck- flexible , trachea midline, no stridor , thyroid nl, carotid no bruit Chest - symmetrical excursion , unlabored           Heart/CV- RRR , no murmur , no gallop  , no rub, nl s1 s2                           - JVD- none , edema- none, stasis changes- none, varices- none           Lung- clear to P&A but quite distant, wheeze- none, cough- none , dullness-none, rub- none           Chest wall-  Abd- tender-no, distended-no, bowel sounds-present, HSM- no Br/ Gen/ Rectal- Not done, not indicated Extrem- cyanosis- none, clubbing, none, atrophy- none, strength- nl Neuro- grossly intact to observation

## 2011-06-13 NOTE — Assessment & Plan Note (Signed)
Recent bronchitic exacerbation of her severe COPD. Treatment is started. She'll finish the prednisone taper and we're adding doxycycline.

## 2011-06-13 NOTE — Patient Instructions (Signed)
Script sent for antibiotic   Continue your usual breathing meds and nurse treatments for your left arm infection

## 2011-08-07 ENCOUNTER — Other Ambulatory Visit: Payer: Self-pay | Admitting: Specialist

## 2011-08-07 DIAGNOSIS — M549 Dorsalgia, unspecified: Secondary | ICD-10-CM

## 2011-08-14 ENCOUNTER — Other Ambulatory Visit (HOSPITAL_COMMUNITY): Payer: Self-pay | Admitting: Specialist

## 2011-08-14 DIAGNOSIS — C3490 Malignant neoplasm of unspecified part of unspecified bronchus or lung: Secondary | ICD-10-CM

## 2011-08-24 ENCOUNTER — Inpatient Hospital Stay (HOSPITAL_COMMUNITY): Admission: RE | Admit: 2011-08-24 | Payer: Medicare Other | Source: Ambulatory Visit

## 2011-08-24 ENCOUNTER — Other Ambulatory Visit: Payer: Self-pay | Admitting: Internal Medicine

## 2011-08-24 ENCOUNTER — Other Ambulatory Visit (HOSPITAL_COMMUNITY): Payer: Medicare Other

## 2011-08-24 ENCOUNTER — Encounter (HOSPITAL_COMMUNITY): Payer: Medicare Other

## 2011-08-24 ENCOUNTER — Ambulatory Visit (HOSPITAL_COMMUNITY): Payer: Medicare Other

## 2011-08-24 DIAGNOSIS — N6320 Unspecified lump in the left breast, unspecified quadrant: Secondary | ICD-10-CM

## 2011-08-31 ENCOUNTER — Ambulatory Visit
Admission: RE | Admit: 2011-08-31 | Discharge: 2011-08-31 | Disposition: A | Discharge status: Home / Self Care | Payer: Medicare Other | Source: Ambulatory Visit | Attending: Internal Medicine | Admitting: Internal Medicine

## 2011-08-31 DIAGNOSIS — N6320 Unspecified lump in the left breast, unspecified quadrant: Secondary | ICD-10-CM

## 2011-09-01 ENCOUNTER — Ambulatory Visit (HOSPITAL_COMMUNITY)
Admission: RE | Admit: 2011-09-01 | Discharge: 2011-09-01 | Disposition: A | Discharge status: Home / Self Care | Payer: Medicare Other | Source: Ambulatory Visit | Attending: Specialist | Admitting: Specialist

## 2011-09-01 ENCOUNTER — Other Ambulatory Visit (HOSPITAL_COMMUNITY): Payer: Medicare Other

## 2011-09-01 DIAGNOSIS — J449 Chronic obstructive pulmonary disease, unspecified: Secondary | ICD-10-CM | POA: Insufficient documentation

## 2011-09-01 DIAGNOSIS — M545 Low back pain, unspecified: Secondary | ICD-10-CM | POA: Insufficient documentation

## 2011-09-01 DIAGNOSIS — J4489 Other specified chronic obstructive pulmonary disease: Secondary | ICD-10-CM | POA: Insufficient documentation

## 2011-09-01 DIAGNOSIS — M4130 Thoracogenic scoliosis, site unspecified: Secondary | ICD-10-CM | POA: Insufficient documentation

## 2011-09-01 DIAGNOSIS — R0602 Shortness of breath: Secondary | ICD-10-CM | POA: Insufficient documentation

## 2011-09-01 DIAGNOSIS — M899 Disorder of bone, unspecified: Secondary | ICD-10-CM | POA: Insufficient documentation

## 2011-09-01 DIAGNOSIS — E041 Nontoxic single thyroid nodule: Secondary | ICD-10-CM | POA: Insufficient documentation

## 2011-09-01 DIAGNOSIS — K7689 Other specified diseases of liver: Secondary | ICD-10-CM | POA: Insufficient documentation

## 2011-09-01 DIAGNOSIS — M546 Pain in thoracic spine: Secondary | ICD-10-CM | POA: Insufficient documentation

## 2011-09-01 DIAGNOSIS — X58XXXA Exposure to other specified factors, initial encounter: Secondary | ICD-10-CM | POA: Insufficient documentation

## 2011-09-01 DIAGNOSIS — R05 Cough: Secondary | ICD-10-CM | POA: Insufficient documentation

## 2011-09-01 DIAGNOSIS — Z85118 Personal history of other malignant neoplasm of bronchus and lung: Secondary | ICD-10-CM | POA: Insufficient documentation

## 2011-09-01 DIAGNOSIS — C3490 Malignant neoplasm of unspecified part of unspecified bronchus or lung: Secondary | ICD-10-CM

## 2011-09-01 DIAGNOSIS — M549 Dorsalgia, unspecified: Secondary | ICD-10-CM | POA: Insufficient documentation

## 2011-09-01 DIAGNOSIS — R911 Solitary pulmonary nodule: Secondary | ICD-10-CM | POA: Insufficient documentation

## 2011-09-01 DIAGNOSIS — R059 Cough, unspecified: Secondary | ICD-10-CM | POA: Insufficient documentation

## 2011-09-01 DIAGNOSIS — S32009A Unspecified fracture of unspecified lumbar vertebra, initial encounter for closed fracture: Secondary | ICD-10-CM | POA: Insufficient documentation

## 2011-09-01 MED ORDER — IOHEXOL 300 MG/ML  SOLN
100.0000 mL | Freq: Once | INTRAMUSCULAR | Status: AC | PRN
  Administered 2011-09-01: 100 mL via INTRAVENOUS

## 2011-09-06 ENCOUNTER — Encounter (HOSPITAL_COMMUNITY)
Admission: RE | Admit: 2011-09-06 | Discharge: 2011-09-06 | Disposition: A | Discharge status: Home / Self Care | Payer: Medicare Other | Source: Ambulatory Visit | Attending: Specialist | Admitting: Specialist

## 2011-09-06 ENCOUNTER — Ambulatory Visit (HOSPITAL_COMMUNITY)
Admission: RE | Admit: 2011-09-06 | Discharge: 2011-09-06 | Disposition: A | Discharge status: Home / Self Care | Payer: Medicare Other | Source: Ambulatory Visit | Attending: Specialist | Admitting: Specialist

## 2011-09-06 DIAGNOSIS — C3490 Malignant neoplasm of unspecified part of unspecified bronchus or lung: Secondary | ICD-10-CM

## 2011-09-06 MED ORDER — TECHNETIUM TC 99M MEDRONATE IV KIT
23.7000 | PACK | Freq: Once | INTRAVENOUS | Status: AC | PRN
  Administered 2011-09-06: 23.7 via INTRAVENOUS

## 2011-09-15 ENCOUNTER — Encounter (HOSPITAL_COMMUNITY): Payer: Medicare Other

## 2011-09-15 ENCOUNTER — Other Ambulatory Visit (HOSPITAL_COMMUNITY): Payer: Medicare Other

## 2011-09-15 ENCOUNTER — Ambulatory Visit (HOSPITAL_COMMUNITY): Payer: Medicare Other

## 2011-09-15 ENCOUNTER — Inpatient Hospital Stay (HOSPITAL_COMMUNITY)
Admission: RE | Admit: 2011-09-15 | Discharge: 2011-09-15 | Payer: Medicare Other | Source: Ambulatory Visit | Attending: Radiation Oncology | Admitting: Radiation Oncology

## 2011-09-21 ENCOUNTER — Ambulatory Visit
Admission: RE | Admit: 2011-09-21 | Discharge: 2011-09-21 | Disposition: A | Discharge status: Home / Self Care | Payer: Medicare Other | Source: Ambulatory Visit | Attending: Radiation Oncology | Admitting: Radiation Oncology

## 2011-09-21 ENCOUNTER — Encounter: Payer: Self-pay | Admitting: Radiation Oncology

## 2011-09-21 DIAGNOSIS — C3490 Malignant neoplasm of unspecified part of unspecified bronchus or lung: Secondary | ICD-10-CM

## 2011-09-21 NOTE — Progress Notes (Signed)
CC:   Clinton D. Young, MD, FCCP, FACP  DIAGNOSIS:  Stage I non-small cell lung cancer of the left upper lobe.  PREVIOUS RADIATION:  60 Gy completed in 02/2011.  INTERVAL SINCE TREATMENT:  7 months.  INTERVAL HISTORY:  [name] reports for followup today.  She is overall feeling well, although she is somewhat anxious about the results of her scans.  She has followup with Dr. Maple Hudson at the beginning of this month.  She is going to talk to him about smoking cessation.  Her shortness of breath is stable.  She is still smoking about a pack a day. She had a CT of the chest on January 7th which showed a decrease in size of the treated nodule.  When I reviewed the imaging, it appears that this nodule is starting to break up a little bit and is definitely not as confluent as it was in the past.  PHYSICAL EXAMINATION:  General Appearance:  She is a pleasant female in no distress sitting comfortably on the examining room table.  She has a nasal cannula in place.  Vital Signs:  Weight is 119 pounds.  Blood pressure 137/92.  Pulse 95.  Temperature 97.8.  Neurologic:  She is alert and oriented x3.  IMPRESSION:  Stage I non-small cell lung cancer, status post SBRT with no evidence of disease.  RECOMMENDATIONS:  I will see her back in a year.  I have recommended that she followup with Dr. Maple Hudson this summer and have a scan at that time.  She knows to contact us with any questions or concerns in the interim.    ______________________________ Lurline Hare, M.D. SW/MEDQ  D:  09/21/2011  T:  09/21/2011  Job:  [id number]

## 2011-09-21 NOTE — Progress Notes (Signed)
Here today for left upper lobe ca.  Very anxious and says she is scared today.  Still smoking daily, about 1 pack / day.  Says she wishes you would give her something to stop smoking

## 2011-09-22 ENCOUNTER — Telehealth: Payer: Self-pay | Admitting: *Deleted

## 2011-09-29 NOTE — Telephone Encounter (Signed)
xxx

## 2011-10-10 ENCOUNTER — Ambulatory Visit (INDEPENDENT_AMBULATORY_CARE_PROVIDER_SITE_OTHER): Payer: Medicare Other | Admitting: Internal Medicine

## 2011-10-10 ENCOUNTER — Encounter: Payer: Self-pay | Admitting: Internal Medicine

## 2011-10-10 VITALS — BP 124/78 | HR 99 | Ht 63.0 in | Wt 131.0 lb

## 2011-10-10 DIAGNOSIS — C3490 Malignant neoplasm of unspecified part of unspecified bronchus or lung: Secondary | ICD-10-CM

## 2011-10-10 DIAGNOSIS — F17200 Nicotine dependence, unspecified, uncomplicated: Secondary | ICD-10-CM

## 2011-10-10 DIAGNOSIS — J449 Chronic obstructive pulmonary disease, unspecified: Secondary | ICD-10-CM

## 2011-10-10 MED ORDER — ALPRAZOLAM 0.5 MG PO TABS: 0.5000 mg | ORAL_TABLET | Freq: Three times a day (TID) | ORAL | Status: DC | PRN

## 2011-10-10 NOTE — Patient Instructions (Signed)
Script- alprazolam for nervousness  Order [facility]- Home oximetry at rest and exercise ON 5 L/M O2     Dx COPD  Order- schedule CT chest with contrast in 4 months   Dx  Lung cancer, Radiation Therapy  Sample Tudorza inhaler-   1 puff, twice daily

## 2011-10-10 NOTE — Progress Notes (Signed)
Patient ID: [name], female    DOB: 04/18/1951, 61 y.o.   MRN: [id number]  HPI  12/09/10- 25 yoF smoker followed for COPD, tobacco abuse and lung nodule, still smoking. Complicating hx of extensive neurofibromatosis, depression, osteopenia. Last here August 11, 2010 after hosp twice for exacerbations of COPD, unable to afford meds. Oxygen dependent. During hosp CXR showed 1.4x1.0 LUL nodule.  Since last here, she had exacerbation with dyspnea while visiting sister in [place]. They admitted her to [facility] again 2-3 weeks ago- breathing better, but upped O2 to 5 L. They did needle bx of the lung nodule and told her POS for CANCER. She knows no more than that with no f/u planned at [facility].We are requesting records. She wants care here where she lives w/ husband. Very anxious- asks help for nerves- has taken xanax 1 mg in past w/o oversedation. Feels ok. Denies cough, phlegm, blood, pain or swelling. Easy DOE across room. Feet not swelling.   12/30/10- 59 yoF smoker followed for COPD, tobacco abuse , still smoking. Complicating hx of extensive neurofibromatosis, depression, osteopenia. Adenoca LUL/ XRT,  CAD, neurofibromatosis, meningioma Since last here, cytopath from [facility] confirmed needle lung bx adenoca. PET 13 suv w/ no mets. CT brain showed stable meningioma. We reviewed discussion at [facility] Conference where recognition that this lesion is on major fissure means too much lung resection for her COPD. Instead we will refer for XRT. She is trying to quit smoking. Denies chest pain. Productive cough with light brownish sputum is routine for her. Denies blood, fever, nodes or breast nodule.  Asks help for sleep, saying that xanax isn't helping.   06/13/11-  59 yoF smoker followed for COPD, tobacco abuse , still smoking. Complicating hx of extensive neurofibromatosis, depression, osteopenia. 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 Has had flu vaccine. Still smoking about 10 cigarettes a day and we discussed ways to quit including transfer to electronic cigarettes. She remains on continuous oxygen 4 L per minute/[facility]. More short of breath in the last week and a half, coughing some green or yellow sputum and feeling heavy through the chest. Denies fever, sore throat, palpitations, edema, blood. Had a MRSA abscess left axilla surgically drained. Completed antibiotics 2 weeks ago. Home health nurses packing it. We called in a prednisone taper last night for bronchitic exacerbation.  10/10/11-  59 yoF smoker followed for COPD, tobacco abuse , still smoking. Complicating hx of extensive neurofibromatosis, depression, osteopenia. Adenoca LUL/ XRT,  CAD, meningioma,  CAD, She is interested in trying again to quit smoking. Failed patches and electronic cigarettes. History of depression makes Chantix a poor choice. Home oxygen concentrator set at 5/ oxygen saturation usually 98% at home.. Coughs a little, nonproductive. Denies chest pain, blood or purulent sputum. Taking no pulmonary meds but does have a nebulizer.. CT chest 08/14/11- reviewed with her: Plan f/u CT this summer. IMPRESSION:  1. Further reduction in conspicuity of the left upper lobe nodule.  2. Cutaneous findings of neurofibromatosis.  3. Severe emphysema.  4. Stable small hepatic and splenic lesions are probably benign  but technically nonspecific; these are not associated with this  observable hypermetabolic activity on recent PET CT.  Original Report Authenticated By: Dellia Cloud, M.D.       Review of Systems-See HPI Constitutional:   No-   weight loss, night sweats, fevers, chills, fatigue, lassitude. HEENT:   No-  headaches, difficulty swallowing, tooth/dental problems, sore throat,       No-  sneezing, itching, ear ache, nasal congestion, post nasal drip,  CV:  No-   chest pain, orthopnea, PND, swelling in lower extremities,  anasarca,  dizziness, palpitations Resp: acute  shortness of breath with exertion or at rest.              Some   productive cough,  No non-productive cough,  No- coughing up of blood.              No-   change in color of mucus.  No- wheezing.   Skin: No-   rash or lesions. GI:  No-   heartburn, indigestion, abdominal pain, nausea, vomiting, diarrhea,                 change in bowel habits, loss of appetite GU: No-   dysuria, change in color of urine, no urgency or frequency.  No- flank pain. MS:  No-   joint pain or swelling.  No- decreased range of motion.  No- back pain. Neuro-     nothing unusual Psych:  No- change in mood or affect. No depression or anxiety.  No memory loss.      Objective:   Physical Exam General- Alert, Oriented, Affect-appropriate, Distress- none acute; portable oxygen at 4 L, talkative  Skin- neurofibromatosis  Lymphadenopathy- none Head- atraumatic            Eyes- Gross vision intact, PERRLA, conjunctivae clear secretions            Ears- Hearing, canals-normal            Nose- Clear, no-Septal dev, mucus, polyps, erosion, perforation             Throat- Mallampati II , mucosa clear , drainage- none, tonsils- atrophic Neck- flexible , trachea midline, no stridor , thyroid nl, carotid no bruit Chest - symmetrical excursion , unlabored           Heart/CV- RRR , no murmur , no gallop  , no rub, nl s1 s2                           - JVD- none , edema- none, stasis changes- none, varices- none           Lung- clear to P&A but quite distant, wheeze- none, cough- none , dullness-none, rub- none           Chest wall-  Abd- tender-no, distended-no, bowel sounds-present, HSM- no Br/ Gen/ Rectal- Not done, not indicated Extrem- cyanosis- none, clubbing, none, atrophy- none, strength- nl Neuro- grossly intact to observation

## 2011-10-14 NOTE — Assessment & Plan Note (Signed)
Plan followup chest CT this summer.

## 2011-10-14 NOTE — Assessment & Plan Note (Signed)
Plan-home oximetry at rest and with exercise on 5 L of oxygen. Sample trial Tudorza inhaler.

## 2011-10-14 NOTE — Assessment & Plan Note (Signed)
Expresses desire to stop smoking. We have reviewed available support issues. Plan-op alprazolam for nerves and support during smoking cessation. Watch for diversion or misuse.

## 2011-10-17 ENCOUNTER — Inpatient Hospital Stay (HOSPITAL_COMMUNITY)
Admission: EM | Admit: 2011-10-17 | Discharge: 2011-10-21 | DRG: 191 | Disposition: A | Discharge status: Home / Self Care | Payer: Medicare Other | Attending: Internal Medicine | Admitting: Internal Medicine

## 2011-10-17 ENCOUNTER — Encounter (HOSPITAL_COMMUNITY): Payer: Self-pay | Admitting: *Deleted

## 2011-10-17 ENCOUNTER — Other Ambulatory Visit: Payer: Self-pay

## 2011-10-17 ENCOUNTER — Emergency Department (HOSPITAL_COMMUNITY): Payer: Medicare Other

## 2011-10-17 DIAGNOSIS — F329 Major depressive disorder, single episode, unspecified: Secondary | ICD-10-CM | POA: Diagnosis present

## 2011-10-17 DIAGNOSIS — I509 Heart failure, unspecified: Secondary | ICD-10-CM | POA: Diagnosis present

## 2011-10-17 DIAGNOSIS — Z880 Allergy status to penicillin: Secondary | ICD-10-CM

## 2011-10-17 DIAGNOSIS — Q8500 Neurofibromatosis, unspecified: Secondary | ICD-10-CM | POA: Diagnosis present

## 2011-10-17 DIAGNOSIS — Z66 Do not resuscitate: Secondary | ICD-10-CM | POA: Diagnosis present

## 2011-10-17 DIAGNOSIS — Z801 Family history of malignant neoplasm of trachea, bronchus and lung: Secondary | ICD-10-CM

## 2011-10-17 DIAGNOSIS — J449 Chronic obstructive pulmonary disease, unspecified: Secondary | ICD-10-CM | POA: Diagnosis present

## 2011-10-17 DIAGNOSIS — F3289 Other specified depressive episodes: Secondary | ICD-10-CM | POA: Diagnosis present

## 2011-10-17 DIAGNOSIS — F17200 Nicotine dependence, unspecified, uncomplicated: Secondary | ICD-10-CM | POA: Diagnosis present

## 2011-10-17 DIAGNOSIS — J441 Chronic obstructive pulmonary disease with (acute) exacerbation: Secondary | ICD-10-CM | POA: Diagnosis present

## 2011-10-17 DIAGNOSIS — C3410 Malignant neoplasm of upper lobe, unspecified bronchus or lung: Secondary | ICD-10-CM | POA: Diagnosis present

## 2011-10-17 DIAGNOSIS — F411 Generalized anxiety disorder: Secondary | ICD-10-CM | POA: Diagnosis present

## 2011-10-17 DIAGNOSIS — K5900 Constipation, unspecified: Secondary | ICD-10-CM | POA: Diagnosis present

## 2011-10-17 DIAGNOSIS — Z9981 Dependence on supplemental oxygen: Secondary | ICD-10-CM

## 2011-10-17 DIAGNOSIS — M545 Low back pain, unspecified: Secondary | ICD-10-CM | POA: Diagnosis present

## 2011-10-17 DIAGNOSIS — J440 Chronic obstructive pulmonary disease with acute lower respiratory infection: Principal | ICD-10-CM | POA: Diagnosis present

## 2011-10-17 DIAGNOSIS — C3490 Malignant neoplasm of unspecified part of unspecified bronchus or lung: Secondary | ICD-10-CM | POA: Diagnosis present

## 2011-10-17 DIAGNOSIS — J209 Acute bronchitis, unspecified: Principal | ICD-10-CM | POA: Diagnosis present

## 2011-10-17 DIAGNOSIS — J4489 Other specified chronic obstructive pulmonary disease: Secondary | ICD-10-CM | POA: Diagnosis present

## 2011-10-17 DIAGNOSIS — J9610 Chronic respiratory failure, unspecified whether with hypoxia or hypercapnia: Secondary | ICD-10-CM | POA: Diagnosis present

## 2011-10-17 DIAGNOSIS — I10 Essential (primary) hypertension: Secondary | ICD-10-CM | POA: Diagnosis present

## 2011-10-17 HISTORY — DX: Malignant neoplasm of unspecified part of unspecified bronchus or lung: C34.90

## 2011-10-17 HISTORY — DX: Shortness of breath: R06.02

## 2011-10-17 LAB — COMPREHENSIVE METABOLIC PANEL
Albumin: 3.3 g/dL — ABNORMAL LOW (ref 3.5–5.2)
Alkaline Phosphatase: 123 U/L — ABNORMAL HIGH (ref 39–117)
BUN: 15 mg/dL (ref 6–23)
Chloride: 106 mEq/L (ref 96–112)
Creatinine, Ser: 0.69 mg/dL (ref 0.50–1.10)
GFR calc Af Amer: >90 mL/min (ref >90)
GFR calc non Af Amer: >90 mL/min (ref >90)
Glucose, Bld: 122 mg/dL — ABNORMAL HIGH (ref 70–99)
Total Bilirubin: 0.3 mg/dL (ref 0.3–1.2)

## 2011-10-17 LAB — TROPONIN I: Troponin I: <0.3 ng/mL (ref <0.30)

## 2011-10-17 LAB — CBC
HCT: 35.7 % — ABNORMAL LOW (ref 36.0–46.0)
Hemoglobin: 12.1 g/dL (ref 12.0–15.0)
MCH: 31.5 pg (ref 26.0–34.0)
MCHC: 33.9 g/dL (ref 30.0–36.0)
RBC: 3.84 MIL/uL — ABNORMAL LOW (ref 3.87–5.11)

## 2011-10-17 LAB — DIFFERENTIAL
Basophils Relative: 0 % (ref 0–1)
Eosinophils Absolute: 0 10*3/uL (ref 0.0–0.7)
Lymphs Abs: 0.6 10*3/uL — ABNORMAL LOW (ref 0.7–4.0)
Monocytes Absolute: 0.6 10*3/uL (ref 0.1–1.0)
Monocytes Relative: 7 % (ref 3–12)
Neutro Abs: 7.9 10*3/uL — ABNORMAL HIGH (ref 1.7–7.7)

## 2011-10-17 LAB — PRO B NATRIURETIC PEPTIDE: Pro B Natriuretic peptide (BNP): 233.6 pg/mL — ABNORMAL HIGH (ref 0–125)

## 2011-10-17 MED ORDER — ALPRAZOLAM 0.5 MG PO TABS
0.5000 mg | ORAL_TABLET | Freq: Four times a day (QID) | ORAL | Status: DC | PRN
  Administered 2011-10-17 – 2011-10-21 (×6): 0.5 mg via ORAL
  Filled 2011-10-17 (×6): qty 1

## 2011-10-17 MED ORDER — ENOXAPARIN SODIUM 40 MG/0.4ML ~~LOC~~ SOLN
40.0000 mg | SUBCUTANEOUS | Status: DC
  Administered 2011-10-17 – 2011-10-21 (×5): 40 mg via SUBCUTANEOUS
  Filled 2011-10-17 (×5): qty 0.4

## 2011-10-17 MED ORDER — PREDNISONE 20 MG PO TABS
60.0000 mg | ORAL_TABLET | ORAL | Status: AC
  Administered 2011-10-17: 60 mg via ORAL
  Filled 2011-10-17: qty 1

## 2011-10-17 MED ORDER — ONDANSETRON HCL 4 MG PO TABS: 4.0000 mg | ORAL_TABLET | Freq: Four times a day (QID) | ORAL | Status: DC | PRN

## 2011-10-17 MED ORDER — ALBUTEROL SULFATE (5 MG/ML) 0.5% IN NEBU
2.5000 mg | INHALATION_SOLUTION | Freq: Two times a day (BID) | RESPIRATORY_TRACT | Status: DC
  Administered 2011-10-17 – 2011-10-18 (×2): 2.5 mg via RESPIRATORY_TRACT
  Filled 2011-10-17 (×2): qty 0.5

## 2011-10-17 MED ORDER — ACETAMINOPHEN 325 MG PO TABS
650.0000 mg | ORAL_TABLET | Freq: Four times a day (QID) | ORAL | Status: DC | PRN
  Administered 2011-10-17 – 2011-10-18 (×2): 650 mg via ORAL
  Filled 2011-10-17 (×2): qty 2

## 2011-10-17 MED ORDER — ACETAMINOPHEN 650 MG RE SUPP: 650.0000 mg | Freq: Four times a day (QID) | RECTAL | Status: DC | PRN

## 2011-10-17 MED ORDER — LEVOFLOXACIN IN D5W 500 MG/100ML IV SOLN
500.0000 mg | INTRAVENOUS | Status: DC
  Administered 2011-10-17 – 2011-10-21 (×5): 500 mg via INTRAVENOUS
  Filled 2011-10-17 (×5): qty 100

## 2011-10-17 MED ORDER — ALBUTEROL SULFATE (5 MG/ML) 0.5% IN NEBU
2.5000 mg | INHALATION_SOLUTION | Freq: Four times a day (QID) | RESPIRATORY_TRACT | Status: DC
  Administered 2011-10-17: 2.5 mg via RESPIRATORY_TRACT
  Filled 2011-10-17: qty 0.5

## 2011-10-17 MED ORDER — SENNOSIDES-DOCUSATE SODIUM 8.6-50 MG PO TABS
1.0000 | ORAL_TABLET | Freq: Every evening | ORAL | Status: DC | PRN
  Filled 2011-10-17: qty 1

## 2011-10-17 MED ORDER — QUETIAPINE FUMARATE 200 MG PO TABS
200.0000 mg | ORAL_TABLET | Freq: Every day | ORAL | Status: DC
  Administered 2011-10-17 – 2011-10-20 (×4): 200 mg via ORAL
  Filled 2011-10-17 (×5): qty 1

## 2011-10-17 MED ORDER — MIRTAZAPINE 15 MG PO TABS
15.0000 mg | ORAL_TABLET | Freq: Every day | ORAL | Status: DC
  Administered 2011-10-17: 15 mg via ORAL
  Filled 2011-10-17 (×2): qty 1

## 2011-10-17 MED ORDER — HYDROMORPHONE HCL PF 1 MG/ML IJ SOLN
1.0000 mg | INTRAMUSCULAR | Status: AC | PRN
  Administered 2011-10-17: 1 mg via INTRAVENOUS
  Filled 2011-10-17: qty 1

## 2011-10-17 MED ORDER — ALBUTEROL SULFATE (5 MG/ML) 0.5% IN NEBU: 2.5000 mg | INHALATION_SOLUTION | Freq: Once | RESPIRATORY_TRACT | Status: DC

## 2011-10-17 MED ORDER — ACETAMINOPHEN 325 MG PO TABS: 975.0000 mg | ORAL_TABLET | Freq: Once | ORAL | Status: DC

## 2011-10-17 MED ORDER — OXYCODONE-ACETAMINOPHEN 5-325 MG PO TABS
1.0000 | ORAL_TABLET | Freq: Once | ORAL | Status: AC
  Administered 2011-10-17: 1 via ORAL
  Filled 2011-10-17: qty 1

## 2011-10-17 MED ORDER — ASPIRIN-ACETAMINOPHEN-CAFFEINE 250-250-65 MG PO TABS
2.0000 | ORAL_TABLET | ORAL | Status: DC | PRN
  Filled 2011-10-17: qty 2

## 2011-10-17 MED ORDER — ALBUTEROL SULFATE (5 MG/ML) 0.5% IN NEBU: 2.5000 mg | INHALATION_SOLUTION | RESPIRATORY_TRACT | Status: AC | PRN

## 2011-10-17 MED ORDER — METHYLPREDNISOLONE SODIUM SUCC 40 MG IJ SOLR
40.0000 mg | Freq: Two times a day (BID) | INTRAMUSCULAR | Status: DC
  Administered 2011-10-17 – 2011-10-18 (×3): 40 mg via INTRAVENOUS
  Filled 2011-10-17 (×4): qty 1

## 2011-10-17 MED ORDER — LISINOPRIL 5 MG PO TABS
5.0000 mg | ORAL_TABLET | Freq: Every day | ORAL | Status: DC
  Administered 2011-10-17 – 2011-10-21 (×5): 5 mg via ORAL
  Filled 2011-10-17 (×5): qty 1

## 2011-10-17 MED ORDER — ALBUTEROL SULFATE (5 MG/ML) 0.5% IN NEBU
2.5000 mg | INHALATION_SOLUTION | Freq: Once | RESPIRATORY_TRACT | Status: AC
  Administered 2011-10-17: 2.5 mg via RESPIRATORY_TRACT
  Filled 2011-10-17: qty 0.5

## 2011-10-17 MED ORDER — OXYCODONE-ACETAMINOPHEN 5-325 MG PO TABS
1.0000 | ORAL_TABLET | Freq: Four times a day (QID) | ORAL | Status: DC | PRN
  Administered 2011-10-17 – 2011-10-21 (×8): 1 via ORAL
  Filled 2011-10-17 (×8): qty 1

## 2011-10-17 MED ORDER — ONDANSETRON HCL 4 MG/2ML IJ SOLN: 4.0000 mg | Freq: Four times a day (QID) | INTRAMUSCULAR | Status: DC | PRN

## 2011-10-17 MED ORDER — DEXTROSE 5 % IV SOLN
500.0000 mg | Freq: Once | INTRAVENOUS | Status: AC
  Administered 2011-10-17: 500 mg via INTRAVENOUS
  Filled 2011-10-17: qty 500

## 2011-10-17 NOTE — ED Notes (Signed)
Pt to xray via stretcher

## 2011-10-17 NOTE — ED Provider Notes (Signed)
History     CSN: [id number]  Arrival date & time 10/17/11  4098   First MD Initiated Contact with Patient 10/17/11 [phone]      Chief Complaint  Patient presents with  . Shortness of Breath    (Consider location/radiation/quality/duration/timing/severity/associated sxs/prior treatment) HPI This female with multiple medical problems now presents with shortness of breath.  She notes that her symptoms began gradually approximately 3 days ago.  Since onset symptoms have been worse.  She is on home oxygen, notes no improvement with increased oxygen use.  Symptoms are worse with exertion.  She notes that this is similar to multiple prior events.  She denies new cough, extremity edema, new significant chest pain, new nausea, vomiting, diarrhea. Past Medical History  Diagnosis Date  . DJD (degenerative joint disease)   . COPD (chronic obstructive pulmonary disease)   . Tobacco dependence 2000    low back  . Avascular necrosis   . Osteoporosis   . Substance abuse   . Neurofibromatosis   . Hyperplastic colonic polyp   . CHF (congestive heart failure)   . Abnormal weight loss   . Essential hypertension, benign   . Depressive disorder, not elsewhere classified   . Constipation   . Low back pain   . Anxiety   . Lung cancer     Past Surgical History  Procedure Date  . Laminectomy 02/2003  . Back surgery     multiple  . Tubal ligation   . Vesicovaginal fistula closure w/ tah   . Cholecystectomy   . Tonsilectomy, adenoidectomy, bilateral myringotomy and tubes   . Hip surgery     right hip, metal plate    Family History  Problem Relation Age of Onset  . Lung cancer Father   . Emphysema Mother   . Neurofibromatosis Mother   . Liver disease      History  Substance Use Topics  . Smoking status: Current Some Day Smoker -- 1.0 packs/day for 35 years    Types: Cigarettes  . Smokeless tobacco: Not on file  . Alcohol Use: No    OB History    Grav Para Term Preterm Abortions TAB  SAB Ect Mult Living                  Review of Systems  All other systems reviewed and are negative.    Allergies  Penicillins  Home Medications   Current Outpatient Rx  Name Route Sig Dispense Refill  . ALPRAZOLAM 0.5 MG PO TABS Oral Take 1 tablet (0.5 mg total) by mouth 3 (three) times daily as needed for sleep or anxiety. 30 tablet 5  . LISINOPRIL 5 MG PO TABS Oral Take 1 tablet by mouth daily.    Marland Kitchen MIRTAZAPINE 7.5 MG PO TABS Oral Take 15 mg by mouth at bedtime.    . OXYCODONE-ACETAMINOPHEN 5-325 MG PO TABS Oral Take 1 tablet by mouth every 6 (six) hours as needed.     Marland Kitchen QUETIAPINE FUMARATE 100 MG PO TABS Oral Take 1 tablet by mouth daily.      BP 131/74  Pulse 117  Temp(Src) 97.4 F (36.3 C) (Oral)  SpO2 99%  Physical Exam  Nursing note and vitals reviewed. Constitutional: She has a sickly appearance.  HENT:  Head: Normocephalic and atraumatic.  Eyes: Conjunctivae and EOM are normal.  Cardiovascular: Tachycardia present.   Pulmonary/Chest: No accessory muscle usage or stridor. Tachypnea noted. No respiratory distress. She has wheezes. She has rhonchi.  Abdominal: Soft.  She exhibits no distension.  Musculoskeletal: She exhibits no edema and no tenderness.  Neurological: She is alert.  Skin: Skin is warm and dry.  Psychiatric: She has a normal mood and affect.    ED Course  Procedures (including critical care time)   Labs Reviewed  CBC  DIFFERENTIAL  COMPREHENSIVE METABOLIC PANEL  TROPONIN I   No results found.   No diagnosis found.  Pulse ox 99% on 15L [place] - abnormal  Cardiac:110-st, abnormal   Date: 10/17/2011  Rate: 113  Rhythm: sinus tachycardia  QRS Axis: right  Intervals: QT prolonged  ST/T Wave abnormalities: nonspecific T wave changes  Conduction Disutrbances:none  Narrative Interpretation:   Old EKG Reviewed: unchanged  ABNORMAL  CXR reviewed by me     MDM  This elderly female with COPD, home O2 requirements now presents with  worsening dyspnea.  On exam the patient is tachycardic, tachypneic.  The patient's ECG is nonischemic, and a prescription of symptoms that developed over days is suggestive of a COPD exacerbation with concern for infection.  The x-ray does not demonstrate overt pneumonia, but given her comorbidities she was treated with azithromycin as well as steroids.  Following albuterol treatment patient had some improvement in her condition.  She was admitted for further evaluation and management.       Gerhard Munch, MD 10/17/11 [phone]

## 2011-10-17 NOTE — ED Notes (Signed)
Patient presents with SOB which woke her up this morning.  EMS applied oxygen via facemask at 15 liters

## 2011-10-17 NOTE — ED Notes (Signed)
Pt back from x-ray.

## 2011-10-17 NOTE — Progress Notes (Signed)
Utilization review complete 

## 2011-10-17 NOTE — ED Notes (Signed)
Lab at [facility], unable to add BNP to blood already drawn. Pt switched to O2 [place] 4L from [facility], 100% SPO@ on 15L, now 92% on 4L [place]. Prednisone given, report given to Jill Poling, Charity fundraiser.

## 2011-10-17 NOTE — Progress Notes (Signed)
   CARE MANAGEMENT NOTE 10/17/2011  Patient:  [name], [name]   Account Number:  [id number]  Date Initiated:  10/17/2011  Documentation initiated by:  Donn Pierini  Subjective/Objective Assessment:   Pt admitted with COPD     Action/Plan:   PTA pt lived at home with husband, was independent with most ADLs, has home O2   Anticipated DC Date:  10/20/2011   Anticipated DC Plan:  HOME/SELF CARE      DC Planning Services  CM consult      Choice offered to / List presented to:             Status of service:  In process, will continue to follow Medicare Important Message given?   (If response is "NO", the following Medicare IM given date fields will be blank) Date Medicare IM given:   Date Additional Medicare IM given:    Discharge Disposition:    Per UR Regulation:    If discussed at Long Length of Stay Meetings, dates discussed:    Comments:  PCP- Bufford Spikes  10/17/11- 1545- Donn Pierini RN, BSN [phone] Spoke with pt at bedside per conversation pt states she lives at home with spouse- has Home O2 at 5L with [facility]. Pt states that a nurse from [facility] was to come tomorrow 10/18/11 to check her O2- Dr. Maple Hudson was concerned she may need higher O2 concentration. Called [facility] main office to notify them of pt's admission to hospital. Pt will need to call [facility] when discharged to reschedule RN visit.  Pt reports that she uses local pharmacy for meds and has medication benefits. Pt states that she has transportation home. CM to follow for potential d/c needs.

## 2011-10-17 NOTE — ED Notes (Signed)
Attempted to call report to floor; unable to accept to call back 

## 2011-10-17 NOTE — H&P (Signed)
PCP:   REED,TIFFANY L., DO, DO   Chief Complaint:  Dyspnea  HPI: 61 year old woman with known chronic respiratory failure, pulmonary fibrosis, COPD, on home oxygen, presented to the emergency room with 2 weeks of increasing dyspnea cough yellow sputum production. She reported that she was unable to sleep last night due to increase work of breathing and respiratory distress.  Review of Systems:   positive for musculoskeletal type chest pain on the left side The patient denies anorexia, fever, weight loss,, vision loss, decreased hearing, hoarseness,  syncope, peripheral edema, balance deficits, hemoptysis, abdominal pain, melena, hematochezia, severe indigestion/heartburn, hematuria, incontinence, genital sores, muscle weakness, transient blindness, difficulty walking, depression, unusual weight change, abnormal bleeding,    Past Medical History: Past Medical History  Diagnosis Date  . DJD (degenerative joint disease)   . COPD (chronic obstructive pulmonary disease)   . Tobacco dependence 2000    low back  . Avascular necrosis   . Osteoporosis   . Substance abuse   . Neurofibromatosis   . Hyperplastic colonic polyp   . CHF (congestive heart failure)   . Abnormal weight loss   . Essential hypertension, benign   . Depressive disorder, not elsewhere classified   . Constipation   . Low back pain   . Anxiety   . Lung cancer   . Shortness of breath    Past Surgical History  Procedure Date  . Laminectomy 02/2003  . Back surgery     multiple  . Tubal ligation   . Vesicovaginal fistula closure w/ tah   . Cholecystectomy   . Tonsilectomy, adenoidectomy, bilateral myringotomy and tubes   . Hip surgery     right hip, metal plate    Medications: Prior to Admission medications   Medication Sig Start Date End Date Taking? Authorizing Provider  albuterol (PROVENTIL HFA;VENTOLIN HFA) 108 (90 BASE) MCG/ACT inhaler Inhale 2 puffs into the lungs 2 (two) times daily.   Yes Historical  Provider, MD  ALPRAZolam Prudy Feeler) 0.5 MG tablet Take 0.5 mg by mouth every 6 (six) hours as needed. For anxiety 10/10/11 10/09/12 Yes Waymon Budge, MD  aspirin-acetaminophen-caffeine (EXCEDRIN MIGRAINE) [phone] MG per tablet Take 2 tablets by mouth every 4 (four) hours as needed. For headache   Yes Historical Provider, MD  lisinopril (PRINIVIL,ZESTRIL) 5 MG tablet Take 1 tablet by mouth daily. 04/23/11  Yes Historical Provider, MD  mirtazapine (REMERON) 7.5 MG tablet Take 15 mg by mouth at bedtime.   Yes Historical Provider, MD  oxyCODONE-acetaminophen (PERCOCET) 5-325 MG per tablet Take 1 tablet by mouth every 6 (six) hours as needed. For pain   Yes Historical Provider, MD  QUEtiapine (SEROQUEL) 200 MG tablet Take 200 mg by mouth at bedtime.   Yes Historical Provider, MD    Allergies:   Allergies  Allergen Reactions  . Penicillins Rash    Social History:  reports that she has been smoking Cigarettes.  She has a 35 pack-year smoking history. She has never used smokeless tobacco. She reports that she does not drink alcohol or use illicit drugs.  History   Social History Narrative   Disabled from Building surveyor and InspectingEstranged from sistersDoesn't drive     Family History: Family History  Problem Relation Age of Onset  . Lung cancer Father   . Emphysema Mother   . Neurofibromatosis Mother   . Liver disease      Physical Exam: Filed Vitals:   10/17/11 0747 10/17/11 0800 10/17/11 0830 10/17/11 1012  BP: 128/76 109/68 111/67 112/68  Pulse: 112 114 113 106  Temp:    98.1 F (36.7 C)  TempSrc:    Oral  Resp: 20 30 25 22   Height:    5\' 4"  (1.626 m)  Weight:    56.246 kg (124 lb)  SpO2:  98% 100% 94%    Alert and oriented x3 Head normocephalic/atraumatic Eyes pupils equal round react, and accommodation Throat without pharyngeal exudate Neck no JVD Chest bilateral wheezes rhonchi, use of accessory muscle-patient unable to speak in full  sentences Heart regular rate and rhythm without murmurs rubs or gallops Abdomen soft nontender bowel sounds present Extremities without edema Skin neurofibromatosis nodules widespread Neurological exam nurse 2-12 intact strength 5/5 all four extremity  sensation intact deep tendon reflexes +2 and symmetric  Labs on Admission:   [facility] 10/17/11 0622  NA 140  K 3.6  CL 106  CO2 23  GLUCOSE 122*  BUN 15  CREATININE 0.69  CALCIUM 9.5  MG --  PHOS --    Basename 10/17/11 0622  AST 11  ALT 9  ALKPHOS 123*  BILITOT 0.3  PROT 7.1  ALBUMIN 3.3*   No results found for this basename: LIPASE:2,AMYLASE:2 in the last 72 hours  Basename 10/17/11 0622  WBC 9.2  NEUTROABS 7.9*  HGB 12.1  HCT 35.7*  MCV 93.0  PLT 200    Basename 10/17/11 0622  CKTOTAL --  CKMB --  CKMBINDEX --  TROPONINI <0.30   No results found for this basename: TSH,T4TOTAL,FREET3,T3FREE,THYROIDAB in the last 72 hours No results found for this basename: VITAMINB12:2,FOLATE:2,FERRITIN:2,TIBC:2,IRON:2,RETICCTPCT:2 in the last 72 hours  Radiological Exams on Admission: Dg Chest 2 View  10/17/2011  *RADIOLOGY REPORT*  Clinical Data: Shortness of breath, lung cancer  CHEST - 2 VIEW  Comparison: 09/01/2011 chest CT  Findings: Centrolobular emphysematous changes.  Bibasilar lung opacities. No pleural effusion. Spiculated left upper lobe nodule measures approximately 1 cm.  No pneumothorax.  Cardiomegaly. Aortic arch atherosclerosis.  Mild bihilar fullness.  Diffuse osteopenia.  Spinal stimulator projects posterior to the T9 level.  IMPRESSION: Emphysematous changes.  Spiculated left upper lobe nodule, measuring 1 cm.  Mild bibasilar opacities; atelectasis versus infiltrate.  Original Report Authenticated By: Waneta Martins, M.D.    Assessment/Plan Present on Admission:  .HYPERTENSION, BENIGN SYSTEMIC .CHRONIC OBSTRUCTIVE PULMONARY DISEASE .Lung cancer .COPD with acute  exacerbation .NEUROFIBROMATOSIS .CHF .DEPRESSIVE DISORDER, NOS  This is a 61 year old woman who presents with COPD exacerbation most likely due to acute bronchitis. Etiology of bronchitis is probably bacterial in nature. She also has a known left upper lobe lung cancer which has been treated with radiotherapy and seems to be radiographically stable. The plan is to treat the patient with albuterol nebulized, intravenous Levaquin, intravenous steroids, supplemental oxygen. Further plan of care depending on how the patient progresses    Lamine Laton 10/17/2011, 12:10 PM

## 2011-10-17 NOTE — ED Notes (Signed)
EMS BP 118/78 HR 124  100% on 15 liters via facemask

## 2011-10-18 LAB — BASIC METABOLIC PANEL
BUN: 17 mg/dL (ref 6–23)
Chloride: 105 mEq/L (ref 96–112)
Glucose, Bld: 130 mg/dL — ABNORMAL HIGH (ref 70–99)
Potassium: 4.8 mEq/L (ref 3.5–5.1)

## 2011-10-18 LAB — CBC
HCT: 38.4 % (ref 36.0–46.0)
Hemoglobin: 12.7 g/dL (ref 12.0–15.0)
MCH: 31.2 pg (ref 26.0–34.0)
MCHC: 33.1 g/dL (ref 30.0–36.0)
MCV: 94.3 fL (ref 78.0–100.0)

## 2011-10-18 MED ORDER — NICOTINE 21 MG/24HR TD PT24
21.0000 mg | MEDICATED_PATCH | Freq: Every day | TRANSDERMAL | Status: DC
  Administered 2011-10-18 – 2011-10-21 (×4): 21 mg via TRANSDERMAL
  Filled 2011-10-18 (×5): qty 1

## 2011-10-18 MED ORDER — BIOTENE DRY MOUTH MT LIQD
15.0000 mL | Freq: Two times a day (BID) | OROMUCOSAL | Status: DC
  Administered 2011-10-18 – 2011-10-20 (×3): 15 mL via OROMUCOSAL

## 2011-10-18 MED ORDER — BIOTENE DRY MOUTH MT LIQD: 15.0000 mL | OROMUCOSAL | Status: DC | PRN

## 2011-10-18 MED ORDER — ALBUTEROL SULFATE (5 MG/ML) 0.5% IN NEBU: 2.5000 mg | INHALATION_SOLUTION | RESPIRATORY_TRACT | Status: DC | PRN

## 2011-10-18 MED ORDER — DOCUSATE SODIUM 100 MG PO CAPS
200.0000 mg | ORAL_CAPSULE | Freq: Two times a day (BID) | ORAL | Status: DC
  Administered 2011-10-18 – 2011-10-21 (×4): 200 mg via ORAL
  Filled 2011-10-18 (×7): qty 2

## 2011-10-18 MED ORDER — ALBUTEROL SULFATE (5 MG/ML) 0.5% IN NEBU
2.5000 mg | INHALATION_SOLUTION | Freq: Four times a day (QID) | RESPIRATORY_TRACT | Status: DC
  Administered 2011-10-18 – 2011-10-21 (×8): 2.5 mg via RESPIRATORY_TRACT
  Filled 2011-10-18 (×7): qty 0.5

## 2011-10-18 MED ORDER — PREDNISONE 20 MG PO TABS
40.0000 mg | ORAL_TABLET | Freq: Every day | ORAL | Status: DC
  Administered 2011-10-19: 40 mg via ORAL
  Filled 2011-10-18 (×2): qty 2

## 2011-10-18 MED ORDER — LORAZEPAM 0.5 MG PO TABS
0.5000 mg | ORAL_TABLET | Freq: Four times a day (QID) | ORAL | Status: DC | PRN
  Administered 2011-10-18 – 2011-10-19 (×2): 0.5 mg via ORAL
  Filled 2011-10-18 (×3): qty 1
  Filled 2011-10-18: qty 2

## 2011-10-18 MED ORDER — LORAZEPAM 0.5 MG PO TABS
1.0000 mg | ORAL_TABLET | Freq: Two times a day (BID) | ORAL | Status: DC
  Administered 2011-10-18 – 2011-10-19 (×2): 1 mg via ORAL
  Filled 2011-10-18 (×2): qty 1

## 2011-10-18 MED ORDER — IPRATROPIUM BROMIDE 0.02 % IN SOLN: 0.5000 mg | Freq: Four times a day (QID) | RESPIRATORY_TRACT | Status: DC | PRN

## 2011-10-18 MED ORDER — MIRTAZAPINE 7.5 MG PO TABS
7.5000 mg | ORAL_TABLET | Freq: Every day | ORAL | Status: DC
  Administered 2011-10-18 – 2011-10-20 (×3): 7.5 mg via ORAL
  Filled 2011-10-18 (×4): qty 1

## 2011-10-18 MED ORDER — POLYETHYLENE GLYCOL 3350 17 G PO PACK
17.0000 g | PACK | Freq: Every day | ORAL | Status: DC | PRN
  Administered 2011-10-18: 17 g via ORAL
  Filled 2011-10-18: qty 1

## 2011-10-18 NOTE — Progress Notes (Signed)
Subjective: Very SOB, can get to restroom without being completely exhausted. Anxiety, severe nictotine w/d   Objective: Vital signs in last 24 hours: Temp:  [97.2 F (36.2 C)-98.4 F (36.9 C)] 98.4 F (36.9 C) (03/13 2215) Pulse Rate:  [83-104] 104  (03/13 2215) Resp:  [20] 20  (03/13 2215) BP: (107-132)/(71-84) 132/71 mmHg (03/13 2215) SpO2:  [95 %-98 %] 95 % (03/13 2215) Weight:  [56.609 kg (124 lb 12.8 oz)] 56.609 kg (124 lb 12.8 oz) (03/13 0611) Weight change:  Last BM Date: 10/15/11  Intake/Output from previous day: 03/12 0701 - 03/13 0700 In: 1240 [P.O.:1140; IV Piggyback:100] Out: 450 [Urine:450] Total I/O In: 120 [P.O.:120] Out: 202 [Urine:200; Stool:2]   Physical Exam: Alert and oriented x3  Head normocephalic/atraumatic, MMM  Neck no JVD  Chest bilateral wheezes rhonchi,  Heart regular rate and rhythm without murmurs rubs or gallops  Abdomen soft nontender bowel sounds present  Extremities without edema  Skin neurofibromatosis nodules widespread   Lab Results: Basic Metabolic Panel:  Basename 10/18/11 0641 10/17/11 0622  NA 143 140  K 4.8 3.6  CL 105 106  CO2 24 23  GLUCOSE 130* 122*  BUN 17 15  CREATININE 0.68 0.69  CALCIUM 9.8 9.5  MG -- --  PHOS -- --   Liver Function Tests:  [facility] 10/17/11 0622  AST 11  ALT 9  ALKPHOS 123*  BILITOT 0.3  PROT 7.1  ALBUMIN 3.3*   No results found for this basename: LIPASE:2,AMYLASE:2 in the last 72 hours No results found for this basename: AMMONIA:2 in the last 72 hours CBC:  Basename 10/18/11 0641 10/17/11 0622  WBC 10.5 9.2  NEUTROABS -- 7.9*  HGB 12.7 12.1  HCT 38.4 35.7*  MCV 94.3 93.0  PLT 232 200   Cardiac Enzymes:  Basename 10/17/11 0622  CKTOTAL --  CKMB --  CKMBINDEX --  TROPONINI <0.30   BNP:  Basename 10/17/11 0715  PROBNP 233.6*   Urine Drug Screen: Drugs of Abuse     Component Value Date/Time   LABOPIA NONE DETECTED 12/19/2010 2335   COCAINSCRNUR NONE DETECTED  12/19/2010 2335   LABBENZ POSITIVE* 12/19/2010 2335   AMPHETMU NONE DETECTED 12/19/2010 2335   THCU NONE DETECTED 12/19/2010 2335   LABBARB  Value: NONE DETECTED        DRUG SCREEN FOR MEDICAL PURPOSES ONLY.  IF CONFIRMATION IS NEEDED FOR ANY PURPOSE, NOTIFY LAB WITHIN 5 DAYS.        LOWEST DETECTABLE LIMITS FOR URINE DRUG SCREEN Drug Class       Cutoff (ng/mL) Amphetamine      1000 Barbiturate      200 Benzodiazepine   200 Tricyclics       300 Opiates          300 Cocaine          300 THC              50 12/19/2010 2335      Recent Results (from the past 240 hour(s))  MRSA PCR SCREENING     Status: Normal   Collection Time   10/18/11 12:51 PM      Component Value Range Status Comment   MRSA by PCR NEGATIVE  NEGATIVE  Final     Studies/Results: Dg Chest 2 View  10/17/2011  *RADIOLOGY REPORT*  Clinical Data: Shortness of breath, lung cancer  CHEST - 2 VIEW  Comparison: 09/01/2011 chest CT  Findings: Centrolobular emphysematous changes.  Bibasilar lung opacities. No pleural  effusion. Spiculated left upper lobe nodule measures approximately 1 cm.  No pneumothorax.  Cardiomegaly. Aortic arch atherosclerosis.  Mild bihilar fullness.  Diffuse osteopenia.  Spinal stimulator projects posterior to the T9 level.  IMPRESSION: Emphysematous changes.  Spiculated left upper lobe nodule, measuring 1 cm.  Mild bibasilar opacities; atelectasis versus infiltrate.  Original Report Authenticated By: Waneta Martins, M.D.    Medications: Scheduled Meds:   . albuterol  2.5 mg Nebulization Q6H  . antiseptic oral rinse  15 mL Mouth Rinse BID  . docusate sodium  200 mg Oral BID  . enoxaparin  40 mg Subcutaneous Q24H  . levofloxacin (LEVAQUIN) IV  500 mg Intravenous Q24H  . lisinopril  5 mg Oral Daily  . LORazepam  1 mg Oral BID  . mirtazapine  7.5 mg Oral QHS  . nicotine  21 mg Transdermal Daily  . predniSONE  40 mg Oral Q breakfast  . QUEtiapine  200 mg Oral QHS  . DISCONTD: albuterol  2.5 mg Nebulization  BID  . DISCONTD: methylPREDNISolone (SOLU-MEDROL) injection  40 mg Intravenous Q12H  . DISCONTD: mirtazapine  15 mg Oral QHS   Continuous Infusions:  PRN Meds:.acetaminophen, acetaminophen, albuterol, ALPRAZolam, aspirin-acetaminophen-caffeine, ipratropium, LORazepam, ondansetron (ZOFRAN) IV, ondansetron, oxyCODONE-acetaminophen, polyethylene glycol, senna-docusate, DISCONTD: antiseptic oral rinse  Assessment/Plan:  Principal Problem:  *COPD with acute exacerbation Active Problems:  NEUROFIBROMATOSIS  DEPRESSIVE DISORDER, NOS  HYPERTENSION, BENIGN SYSTEMIC  CHF  CHRONIC OBSTRUCTIVE PULMONARY DISEASE  Lung cancer Severe Anxiety Ongoing tobacco abuse  Plan: -start her on oral prednisone -treat air hunger with Ativan -needs further Op w/u of Lung Ca including staging- she tells me "they got it all" but she has never had surgery. -scheduled nebs and added atrovent -possible d/c tomorro if improved -we discussed prognosis, code status and future needs such as hospice and palliative care -she is a DNR, would consider short term Bi-pap if alleviated SOB/Dyspnea   LOS: 1 day   [name] [facility] 10/18/2011, 11:10 PM

## 2011-10-18 NOTE — Plan of Care (Signed)
Problem: Consults Goal: Respiratory Problems Patient Education See Patient Education Module for education specifics.  Outcome: Completed/Met Date Met:  10/18/11 Pt states she has 02 concentrator @ home Someone else @ home uses 02. Educated that no one should smoke in a home where there are 02 tanks. Marisa Cyphers RN

## 2011-10-19 ENCOUNTER — Telehealth: Payer: Self-pay | Admitting: Internal Medicine

## 2011-10-19 MED ORDER — LORAZEPAM 0.5 MG PO TABS
1.0000 mg | ORAL_TABLET | Freq: Three times a day (TID) | ORAL | Status: DC
  Administered 2011-10-19 – 2011-10-21 (×5): 1 mg via ORAL
  Filled 2011-10-19: qty 1
  Filled 2011-10-19 (×4): qty 2

## 2011-10-19 MED ORDER — MORPHINE SULFATE (CONCENTRATE) 20 MG/ML PO SOLN: 5.0000 mg | ORAL | Status: DC | PRN

## 2011-10-19 MED ORDER — MORPHINE SULFATE 10 MG/5ML PO SOLN: 5.0000 mg | ORAL | Status: DC | PRN

## 2011-10-19 MED ORDER — MORPHINE SULFATE 10 MG/5ML PO SOLN
5.0000 mg | ORAL | Status: DC | PRN
  Administered 2011-10-19 – 2011-10-20 (×5): 10 mg via ORAL
  Filled 2011-10-19: qty 10
  Filled 2011-10-19 (×5): qty 5

## 2011-10-19 MED ORDER — PREDNISONE 50 MG PO TABS
60.0000 mg | ORAL_TABLET | Freq: Every day | ORAL | Status: DC
  Administered 2011-10-20 – 2011-10-21 (×2): 60 mg via ORAL
  Filled 2011-10-19 (×3): qty 1

## 2011-10-19 NOTE — Progress Notes (Signed)
   CARE MANAGEMENT NOTE 10/19/2011  Patient:  [name], [name]   Account Number:  [id number]  Date Initiated:  10/17/2011  Documentation initiated by:  Donn Pierini  Subjective/Objective Assessment:   Pt admitted with COPD     Action/Plan:   PTA pt lived at home with husband, was independent with most ADLs, has home O2   Anticipated DC Date:  10/20/2011   Anticipated DC Plan:  HOME/SELF CARE      DC Planning Services  CM consult      Choice offered to / List presented to:             Status of service:  In process, will continue to follow Medicare Important Message given?   (If response is "NO", the following Medicare IM given date fields will be blank) Date Medicare IM given:   Date Additional Medicare IM given:    Discharge Disposition:    Per UR Regulation:    If discussed at Long Length of Stay Meetings, dates discussed:    Comments:  PCP- Bufford Spikes  10/19/2011 Darlyne Russian RN, CCM 4:00 pm Met with patient to discuss discharge planning needs. She reports she had [facility] home care services for PT in the past with [employer].  [facility] provides DME: O2, hospital bed, rolling walker, power chair, cane. She denies need for any further DME. She requests Lynnae Sandhoff for any home PT services.    10/17/11- 1545- Donn Pierini RN, BSN [phone] Spoke with pt at bedside per conversation pt states she lives at home with spouse- has Home O2 at 5L with [facility]. Pt states that a nurse from [facility] was to come tomorrow 10/18/11 to check her O2- Dr. Maple Hudson was concerned she may need higher O2 concentration. Called [facility] main office to notify them of pt's admission to hospital. Pt will need to call [facility] when discharged to reschedule RN visit.  Pt reports that she uses local pharmacy for meds and has medication benefits. Pt states that she has transportation home. CM to follow for potential d/c needs.

## 2011-10-19 NOTE — Progress Notes (Signed)
Subjective: No improvement today, VERY agitated. Husband at bedside. She is very SOB, cant get to restroom without being completely exhausted. She asking me if I can increase her oxygen-she wants me to ambulate her in hall for oximetry test.    Objective: Vital signs in last 24 hours: Temp:  [97.5 F (36.4 C)-98.4 F (36.9 C)] 98.4 F (36.9 C) (03/14 2227) Pulse Rate:  [91-109] 107  (03/14 2227) Resp:  [20] 20  (03/14 2227) BP: (120-145)/(72-83) 145/72 mmHg (03/14 2227) SpO2:  [93 %-99 %] 94 % (03/14 2227) Weight:  [57.244 kg (126 lb 3.2 oz)] 57.244 kg (126 lb 3.2 oz) (03/14 0524) Weight change: 0.998 kg (2 lb 3.2 oz) Last BM Date: 10/18/11  Intake/Output from previous day: 03/13 0701 - 03/14 0700 In: 1540 [P.O.:1540] Out: 905 [Urine:900; Stool:5] Total I/O In: -  Out: 201 [Urine:200; Stool:1]   Physical Exam: Alert and oriented x3  Head normocephalic/atraumatic, MMM  Neck no JVD  Chest bilateral wheezes rhonchi,  Heart regular rate and rhythm without murmurs rubs or gallops  Abdomen soft nontender bowel sounds present  Extremities without edema  Skin neurofibromatosis nodules widespread   Lab Results: Basic Metabolic Panel:  Basename 10/18/11 0641 10/17/11 0622  NA 143 140  K 4.8 3.6  CL 105 106  CO2 24 23  GLUCOSE 130* 122*  BUN 17 15  CREATININE 0.68 0.69  CALCIUM 9.8 9.5  MG -- --  PHOS -- --   Liver Function Tests:  [facility] 10/17/11 0622  AST 11  ALT 9  ALKPHOS 123*  BILITOT 0.3  PROT 7.1  ALBUMIN 3.3*   No results found for this basename: LIPASE:2,AMYLASE:2 in the last 72 hours No results found for this basename: AMMONIA:2 in the last 72 hours CBC:  Basename 10/18/11 0641 10/17/11 0622  WBC 10.5 9.2  NEUTROABS -- 7.9*  HGB 12.7 12.1  HCT 38.4 35.7*  MCV 94.3 93.0  PLT 232 200   Cardiac Enzymes:  Basename 10/17/11 0622  CKTOTAL --  CKMB --  CKMBINDEX --  TROPONINI <0.30   BNP:  Basename 10/17/11 0715  PROBNP 233.6*   Urine  Drug Screen: Drugs of Abuse     Component Value Date/Time   LABOPIA NONE DETECTED 12/19/2010 2335   COCAINSCRNUR NONE DETECTED 12/19/2010 2335   LABBENZ POSITIVE* 12/19/2010 2335   AMPHETMU NONE DETECTED 12/19/2010 2335   THCU NONE DETECTED 12/19/2010 2335   LABBARB  Value: NONE DETECTED        DRUG SCREEN FOR MEDICAL PURPOSES ONLY.  IF CONFIRMATION IS NEEDED FOR ANY PURPOSE, NOTIFY LAB WITHIN 5 DAYS.        LOWEST DETECTABLE LIMITS FOR URINE DRUG SCREEN Drug Class       Cutoff (ng/mL) Amphetamine      1000 Barbiturate      200 Benzodiazepine   200 Tricyclics       300 Opiates          300 Cocaine          300 THC              50 12/19/2010 2335      Recent Results (from the past 240 hour(s))  MRSA PCR SCREENING     Status: Normal   Collection Time   10/18/11 12:51 PM      Component Value Range Status Comment   MRSA by PCR NEGATIVE  NEGATIVE  Final     Studies/Results: No results found.  Medications: Scheduled Meds:    .  albuterol  2.5 mg Nebulization Q6H  . antiseptic oral rinse  15 mL Mouth Rinse BID  . docusate sodium  200 mg Oral BID  . enoxaparin  40 mg Subcutaneous Q24H  . levofloxacin (LEVAQUIN) IV  500 mg Intravenous Q24H  . lisinopril  5 mg Oral Daily  . LORazepam  1 mg Oral TID  . mirtazapine  7.5 mg Oral QHS  . nicotine  21 mg Transdermal Daily  . predniSONE  60 mg Oral Q breakfast  . QUEtiapine  200 mg Oral QHS  . DISCONTD: LORazepam  1 mg Oral BID  . DISCONTD: predniSONE  40 mg Oral Q breakfast   Continuous Infusions:  PRN Meds:.acetaminophen, acetaminophen, albuterol, ALPRAZolam, aspirin-acetaminophen-caffeine, ipratropium, morphine, ondansetron (ZOFRAN) IV, ondansetron, oxyCODONE-acetaminophen, polyethylene glycol, senna-docusate, DISCONTD: LORazepam, DISCONTD: morphine, DISCONTD: morphine, DISCONTD: morphine  Assessment/Plan:  Principal Problem:  *COPD with acute exacerbation Active Problems:  NEUROFIBROMATOSIS  DEPRESSIVE DISORDER, NOS  HYPERTENSION,  BENIGN SYSTEMIC  CHF  CHRONIC OBSTRUCTIVE PULMONARY DISEASE  Lung cancer Severe Anxiety Ongoing tobacco abuse  Plan: -increase her oral prednisone to 60 daily -treat air hunger with Ativan/Roxanol-I am not sure more O2 will actually benefit her although she wants me to increase her flow, I increased this to 5L nasal cannula.  -needs further Op w/u of Lung Ca including staging- she tells me "they got it all" but she has never had surgery. -scheduled nebs and added atrovent -still not ready for discharge -we discussed prognosis, code status and future needs such as hospice and palliative care -she is a DNR, would consider short term Bi-pap if alleviated SOB/Dyspnea -for her anxiety I changed her ativan to 3 times a day- this dose has barely touched her anxiety-added Roxanol. -constipation has resolved   LOS: 2 days   Hensley Aziz [facility] 10/19/2011, 11:16 PM

## 2011-10-19 NOTE — Telephone Encounter (Signed)
Called and spoke with pt and explained to her that the doctors at the hospital will have to do this order for her.  She is aware and she stated that if it is not done while she is in the hospital then she will call once she is out of the hospital.

## 2011-10-20 NOTE — Clinical Documentation Improvement (Signed)
CHF DOCUMENTATION CLARIFICATION QUERY  THIS DOCUMENT IS NOT A PERMANENT PART OF THE MEDICAL RECORD   Please update your documentation within the medical record to reflect your response to this query.                                                                                     10/20/11  Dr. Phillips Odor and[facility],  In a better effort to capture your patient's severity of illness, reflect appropriate length of stay and utilization of resources, a review of the patient medical record has revealed the following indicators.   Based on your clinical judgment, please document the ACUITY and TYPE of CHF in the progress notes and discharge summary:  ACUITY  - Acute  - Chronic  - Acute on Chronic  - Unable to Clinically Determine  AND   TYPE  - Systolic  - Diastolic  - Combined  - Left Heart Failure  - Right Heart Failure  - Unable to Clinically Determine  Clinical Information:  "CHF" documented in H&P and daily progress notes  Echo 10/25/2009  - Left ventricle: The cavity size was normal. Wall thickness was increased in a pattern of mild LVH. Systolic function was normal.  The estimated ejection fraction was in the range of 60% to 65%.  Regional wall motion abnormalities cannot be excluded. - Left atrium: The atrium was mildly dilated. - Pulmonary arteries: PA peak pressure: 55mm Hg (S).   - Impressions:  The right ventricular systolic pressure was increased consistent with moderate pulmonary hypertension.  Home Meds include:   PRINIVIL,ZESTRIL) 5 MG tablet  Take 1 tablet by mouth daily.    04/23/11   In responding to this query please exercise your independent judgment.  The fact that a query is asked, does not imply that any particular answer is desired or expected.  Reviewed: Query not addressed.  dc'ed 10/21/11.  Query still open in [employer] for poss. RQ.  Account has not been coded yet, either.  No dc summary available.  Mathis Dad RN  Thank You,  Jerral Ralph  RN BSN Certified Clinical Documentation Specialist: Cell   [phone]  Health Information Management [place]   TO RESPOND TO THE THIS QUERY, FOLLOW THE INSTRUCTIONS BELOW:  1. If needed, update documentation for the patient's encounter via the notes activity.  2. Access this query again and click edit on the In [employer].  3. After updating, or not, click F2 to complete all highlighted (required) fields concerning your review. Select "additional documentation in the medical record" OR "no additional documentation provided".  4. Click Sign note button.  5. The deficiency will fall out of your In Basket *Please let us know if you are not able to complete this workflow by phone or e-mail (listed below).

## 2011-10-21 MED ORDER — FLUTICASONE-SALMETEROL 115-21 MCG/ACT IN AERO: 2.0000 | INHALATION_SPRAY | Freq: Two times a day (BID) | RESPIRATORY_TRACT | Status: DC

## 2011-10-21 MED ORDER — DSS 100 MG PO CAPS: 200.0000 mg | ORAL_CAPSULE | Freq: Two times a day (BID) | ORAL | Status: AC

## 2011-10-21 MED ORDER — AZITHROMYCIN 250 MG PO TABS: 250.0000 mg | ORAL_TABLET | Freq: Every day | ORAL | Status: DC

## 2011-10-21 MED ORDER — PREDNISONE 20 MG PO TABS: ORAL_TABLET | ORAL | Status: DC

## 2011-10-21 MED ORDER — TIOTROPIUM BROMIDE MONOHYDRATE 18 MCG IN CAPS: 18.0000 ug | ORAL_CAPSULE | Freq: Every day | RESPIRATORY_TRACT | Status: DC

## 2011-10-21 MED ORDER — ALBUTEROL SULFATE (5 MG/ML) 0.5% IN NEBU: 2.5000 mg | INHALATION_SOLUTION | Freq: Four times a day (QID) | RESPIRATORY_TRACT | Status: DC

## 2011-10-21 MED ORDER — NICOTINE 21 MG/24HR TD PT24: 1.0000 | MEDICATED_PATCH | Freq: Every day | TRANSDERMAL | Status: AC

## 2011-10-21 MED ORDER — MIRTAZAPINE 7.5 MG PO TABS: 7.5000 mg | ORAL_TABLET | Freq: Every day | ORAL | Status: DC

## 2011-10-21 MED ORDER — MORPHINE SULFATE CR 15 MG PO TB12
15.0000 mg | ORAL_TABLET | Freq: Two times a day (BID) | ORAL | Status: AC
Start: 2011-10-21 — End: 2011-11-20

## 2011-10-21 MED ORDER — POLYETHYLENE GLYCOL 3350 17 G PO PACK: 17.0000 g | PACK | Freq: Every day | ORAL | Status: AC

## 2011-10-21 MED ORDER — ALPRAZOLAM 1 MG PO TABS: 1.0000 mg | ORAL_TABLET | Freq: Three times a day (TID) | ORAL | Status: DC | PRN

## 2011-10-21 NOTE — Progress Notes (Signed)
Subjective: No improvement today, VERY agitated. Husband at bedside. She is very SOB, cant get to restroom without being completely exhausted. She asking me if I can increase her oxygen-she wants me to ambulate her in hall for oximetry test.    Objective: Vital signs in last 24 hours: Temp:  [97.7 F (36.5 C)-97.8 F (36.6 C)] 97.8 F (36.6 C) (03/16 0532) Pulse Rate:  [98-112] 98  (03/16 0532) Resp:  [18-22] 18  (03/16 0532) BP: (138-153)/(73-101) 138/73 mmHg (03/16 0532) SpO2:  [90 %-97 %] 97 % (03/16 0532) Weight:  [58.605 kg (129 lb 3.2 oz)] 58.605 kg (129 lb 3.2 oz) (03/16 0532) Weight change: 0.59 kg (1 lb 4.8 oz) Last BM Date: 10/20/11  Intake/Output from previous day: 03/15 0701 - 03/16 0700 In: 1200.1 [P.O.:1200.1] Out: 1002 [Urine:1000; Stool:2]     Physical Exam: Alert and oriented x3  Head normocephalic/atraumatic, MMM  Neck no JVD  Chest bilateral wheezes rhonchi,  Heart regular rate and rhythm without murmurs rubs or gallops  Abdomen soft nontender bowel sounds present  Extremities without edema  Skin neurofibromatosis nodules widespread   Lab Results: Basic Metabolic Panel: No results found for this basename: NA:2,K:2,CL:2,CO2:2,GLUCOSE:2,BUN:2,CREATININE:2,CALCIUM:2,MG:2,PHOS:2 in the last 72 hours Liver Function Tests: No results found for this basename: AST:2,ALT:2,ALKPHOS:2,BILITOT:2,PROT:2,ALBUMIN:2 in the last 72 hours No results found for this basename: LIPASE:2,AMYLASE:2 in the last 72 hours No results found for this basename: AMMONIA:2 in the last 72 hours CBC: No results found for this basename: WBC:2,NEUTROABS:2,HGB:2,HCT:2,MCV:2,PLT:2 in the last 72 hours Cardiac Enzymes: No results found for this basename: CKTOTAL:3,CKMB:3,CKMBINDEX:3,TROPONINI:3 in the last 72 hours BNP: No results found for this basename: PROBNP:3 in the last 72 hours Urine Drug Screen: Drugs of Abuse     Component Value Date/Time   LABOPIA NONE DETECTED 12/19/2010 2335    COCAINSCRNUR NONE DETECTED 12/19/2010 2335   LABBENZ POSITIVE* 12/19/2010 2335   AMPHETMU NONE DETECTED 12/19/2010 2335   THCU NONE DETECTED 12/19/2010 2335   LABBARB  Value: NONE DETECTED        DRUG SCREEN FOR MEDICAL PURPOSES ONLY.  IF CONFIRMATION IS NEEDED FOR ANY PURPOSE, NOTIFY LAB WITHIN 5 DAYS.        LOWEST DETECTABLE LIMITS FOR URINE DRUG SCREEN Drug Class       Cutoff (ng/mL) Amphetamine      1000 Barbiturate      200 Benzodiazepine   200 Tricyclics       300 Opiates          300 Cocaine          300 THC              50 12/19/2010 2335      Recent Results (from the past 240 hour(s))  MRSA PCR SCREENING     Status: Normal   Collection Time   10/18/11 12:51 PM      Component Value Range Status Comment   MRSA by PCR NEGATIVE  NEGATIVE  Final     Studies/Results: No results found.  Medications: Scheduled Meds:    . albuterol  2.5 mg Nebulization Q6H  . antiseptic oral rinse  15 mL Mouth Rinse BID  . docusate sodium  200 mg Oral BID  . enoxaparin  40 mg Subcutaneous Q24H  . levofloxacin (LEVAQUIN) IV  500 mg Intravenous Q24H  . lisinopril  5 mg Oral Daily  . LORazepam  1 mg Oral TID  . mirtazapine  7.5 mg Oral QHS  . nicotine  21 mg Transdermal  Daily  . predniSONE  60 mg Oral Q breakfast  . QUEtiapine  200 mg Oral QHS   Continuous Infusions:  PRN Meds:.acetaminophen, acetaminophen, albuterol, ALPRAZolam, aspirin-acetaminophen-caffeine, ipratropium, morphine, ondansetron (ZOFRAN) IV, ondansetron, oxyCODONE-acetaminophen, polyethylene glycol, senna-docusate  Assessment/Plan:  Principal Problem:  *COPD with acute exacerbation Active Problems:  NEUROFIBROMATOSIS  DEPRESSIVE DISORDER, NOS  HYPERTENSION, BENIGN SYSTEMIC  CHF  CHRONIC OBSTRUCTIVE PULMONARY DISEASE  Lung cancer Severe Anxiety Ongoing tobacco abuse  Plan: -continue oral prednisone to 60 daily - air hunger BETTER with Ativan/Roxanol-I am not sure more O2 will actually benefit her although she wants me  to increase her flow, I increased this to 5L nasal cannula.  -needs further Op w/u of Lung Ca including staging- she tells me "they got it all" but she has never had surgery. -scheduled nebs and added atrovent -ready for discharge tomorrow -we discussed prognosis, code status and future needs such as hospice and palliative care -she is a DNR, would consider short term Bi-pap if alleviated SOB/Dyspnea -for her anxiety I changed her ativan to 3 times a day- this dose has barely touched her anxiety-added Roxanol. LOS: 4 days   Cote Mayabb [facility] 10/21/2011, 8:21 AM

## 2011-10-21 NOTE — Progress Notes (Signed)
Verbalized understanding of discharge instructions.  Client informed she cannot smoke with a nicoderm patch and must remove it if she decides to smoke again.  She is not to smoke with oxygen in use and the dangers this can cause to her and family.  A simple overview of COPD and how it affects her longs was printed for her and this was gone over.  IV removed site unremarkable. SOB with minimal exertion which is her normal.  She went home with her own oxygen unit, husband provided transport.

## 2011-10-21 NOTE — Discharge Instructions (Signed)
Chronic Obstructive Pulmonary Disease  Chronic obstructive pulmonary disease (COPD) is a lung disease. The lungs become damaged, making it hard to get air in and out of your lungs. The damage to your lungs cannot be changed.   HOME CARE   Stop smoking if you smoke. Avoid secondhand smoke.   Only take medicine as told by your doctor.   Talk to your doctor about using cough syrup or over-the-counter medicines.   Drink enough fluids to keep your pee (urine) clear or pale yellow.   Use a humidifier or vaporizer. This may help loosen the thick spit (mucus).   Talk to your doctor about vaccines that help prevent other lung problems (pneumonia and flu vaccines).   Use home oxygen as told by your doctor.   Stay active and exercise.   Eat healthy foods.  GET HELP RIGHT AWAY IF:    Your heart is beating fast.   You become disturbed, confused, shake, or are dazed.   You have trouble breathing.   You have chest pain.   You have a fever.   You cough up thick spit that is yellowish-white or green.   Your breathing becomes worse when you exercise.   You are running out of the medicine you take for your breathing.  MAKE SURE YOU:    Understand these instructions.   Will watch your condition.   Will get help right away if you are not doing well or get worse.  Document Released: 01/10/2008 Document Revised: 07/13/2011 Document Reviewed: 09/23/2010  [employer] Patient Information 2012 [employer].

## 2011-10-23 ENCOUNTER — Telehealth: Payer: Self-pay | Admitting: Internal Medicine

## 2011-10-23 NOTE — Telephone Encounter (Signed)
Per hospital d/c pt needs HFU with CDY in 2 weeks. Nothing available. Please advise Dr. Maple Hudson, thanks

## 2011-10-23 NOTE — Telephone Encounter (Signed)
Per Katie/CDY: 4.1.13 @ 1115, arrive at 1100.  Left detailed message on machine informing patient of upcoming appt and to arrive at 1100.  Advised pt to call with any questions/concerns.  Will sign off.

## 2011-11-04 NOTE — Discharge Summary (Signed)
Internal Medicine Teaching [facility] Discharge Note  Name: [name] MRN: [id number] DOB: Sep 18, 1950 61 y.o.  Date of Admission: 10/17/2011  6:21 AM Date of Discharge: 10/21/2011 Attending Physician: No att. providers found  Discharge Diagnosis: Principal Problem:  *COPD with acute exacerbation Active Problems:  NEUROFIBROMATOSIS  DEPRESSIVE DISORDER, NOS  HYPERTENSION, BENIGN SYSTEMIC  CHF  CHRONIC OBSTRUCTIVE PULMONARY DISEASE  Lung cancer   Discharge Medications: Medication List  As of 11/04/2011 11:33 AM   STOP taking these medications         aspirin-acetaminophen-caffeine 250-250-65 MG per tablet      oxyCODONE-acetaminophen 5-325 MG per tablet         TAKE these medications         albuterol 108 (90 BASE) MCG/ACT inhaler   Commonly known as: PROVENTIL HFA;VENTOLIN HFA   Inhale 2 puffs into the lungs 2 (two) times daily.      albuterol (5 MG/ML) 0.5% nebulizer solution   Commonly known as: PROVENTIL   Take 0.5 mLs (2.5 mg total) by nebulization every 6 (six) hours.      ALPRAZolam 1 MG tablet   Commonly known as: XANAX   Take 1 tablet (1 mg total) by mouth 3 (three) times daily as needed for anxiety. For anxiety      azithromycin 250 MG tablet   Commonly known as: ZITHROMAX   Take 1 tablet (250 mg total) by mouth daily.      fluticasone-salmeterol 115-21 MCG/ACT inhaler   Commonly known as: ADVAIR HFA   Inhale 2 puffs into the lungs 2 (two) times daily.      lisinopril 5 MG tablet   Commonly known as: PRINIVIL,ZESTRIL   Take 1 tablet by mouth daily.      mirtazapine 7.5 MG tablet   Commonly known as: REMERON   Take 1 tablet (7.5 mg total) by mouth at bedtime.      morphine 15 MG 12 hr tablet   Commonly known as: MS CONTIN   Take 1 tablet (15 mg total) by mouth 2 (two) times daily.      nicotine 21 mg/24hr patch   Commonly known as: NICODERM CQ - dosed in mg/24 hours   Place 1 patch onto the skin daily.      predniSONE 20 MG tablet   Commonly known as: DELTASONE   Take 3 tablets daily for 5 days, then once daily until seen by doctor      QUEtiapine 200 MG tablet   Commonly known as: SEROQUEL   Take 200 mg by mouth at bedtime.      tiotropium 18 MCG inhalation capsule   Commonly known as: SPIRIVA   Place 1 capsule (18 mcg total) into inhaler and inhale daily.            Disposition and follow-up:   Ms.[name] was discharged from [facility] in Stable condition.    Follow-up Appointments: Follow-up Information    Follow up with Waymon Budge, MD in 2 weeks.   Contact information:   [address] [employer] [place] [address] [phone]       Follow up with REED,TIFFANY L., DO in 1 week.   Contact information:   [address] [place] [address] [phone]         Discharge Orders    Future Appointments: Provider: Department: Dept Phone: Center:   11/06/2011 11:15 AM Waymon Budge, MD [facility] [phone]  None   02/09/2012 9:15 AM Waymon Budge, MD [facility] [phone] None   02/13/2012 1:30 PM [facility] [phone] [facility]   02/19/2012 10:00 AM [facility] [phone] [place]   09/19/2012 10:00 AM Lurline Hare, MD [facility] [phone] None     Future Orders Please Complete By Expires   Diet - low sodium heart healthy      Increase activity slowly      Discharge instructions      Comments:   Please take all medication as prescribed.  MS Contin is for SHORTNESS OF BREATH TO BE TAKEN TWICE DAILY ONLY, you can use the Alprazolam for as needed anxiety and air hunger/shortness of breath. New inhalers have been prescribed for you! Please make sure you know how to use them. Please make sure that your family know your wishes regarding your wishes to not go on a breathing machine!    Call MD for:  temperature >100.4      Call MD for:  persistant nausea and  vomiting      Call MD for:  severe uncontrolled pain      Call MD for:  difficulty breathing, headache or visual disturbances      Call MD for:  persistant dizziness or light-headedness         Consultations:    Procedures Performed:  Dg Chest 2 View  10/17/2011  *RADIOLOGY REPORT*  Clinical Data: Shortness of breath, lung cancer  CHEST - 2 VIEW  Comparison: 09/01/2011 chest CT  Findings: Centrolobular emphysematous changes.  Bibasilar lung opacities. No pleural effusion. Spiculated left upper lobe nodule measures approximately 1 cm.  No pneumothorax.  Cardiomegaly. Aortic arch atherosclerosis.  Mild bihilar fullness.  Diffuse osteopenia.  Spinal stimulator projects posterior to the T9 level.  IMPRESSION: Emphysematous changes.  Spiculated left upper lobe nodule, measuring 1 cm.  Mild bibasilar opacities; atelectasis versus infiltrate.  Original Report Authenticated By: Waneta Martins, M.D.   Admission HPI: 61 year old woman with known chronic respiratory failure, pulmonary fibrosis, COPD, on home oxygen, presented to the emergency room with 2 weeks of increasing dyspnea cough yellow sputum production. She was admitted due to increase work of breathing and respiratory distress.  Hospital Course by problem list: Principal Problem:  *COPD with acute exacerbation Active Problems:  NEUROFIBROMATOSIS  DEPRESSIVE DISORDER, NOS  HYPERTENSION, BENIGN SYSTEMIC  CHF  CHRONIC OBSTRUCTIVE PULMONARY DISEASE  Lung cancer  Patient was admitted with COPD exacerbation and stabilized on steriods, nebs, venti mask 02, and oral antibiotics. The patient has a history of lung cancer and had radiation, but the status of her Lung Ca is unclear. She is a DNR and we had a liong discussion about getting Hopsice care at home, her COPD is stage 4. She also has serious issues with symptom management and is highly anxious with air hunger difficult to control. She is able to tolerate high doses of benzodiazapines and  I started her on long acting morphine to manage her dyspnea.  Discharge Vitals:  BP 138/73  Pulse 98  Temp(Src) 97.8 F (36.6 C) (Oral)  Resp 18  Ht 5\' 4"  (1.626 m)  Wt 58.605 kg (129 lb 3.2 oz)  BMI 22.18 kg/m2  SpO2 94%  Discharge Labs: No results found for this or any previous visit (from the past 24 hour(s)).  SignedAnderson Malta 11/04/2011, 11:33 AM

## 2011-11-06 ENCOUNTER — Ambulatory Visit (INDEPENDENT_AMBULATORY_CARE_PROVIDER_SITE_OTHER): Payer: Medicare Other | Admitting: Internal Medicine

## 2011-11-06 ENCOUNTER — Encounter: Payer: Self-pay | Admitting: Internal Medicine

## 2011-11-06 VITALS — BP 124/80 | HR 100 | Ht 63.0 in | Wt 133.0 lb

## 2011-11-06 DIAGNOSIS — J449 Chronic obstructive pulmonary disease, unspecified: Secondary | ICD-10-CM

## 2011-11-06 DIAGNOSIS — J441 Chronic obstructive pulmonary disease with (acute) exacerbation: Secondary | ICD-10-CM

## 2011-11-06 DIAGNOSIS — C3490 Malignant neoplasm of unspecified part of unspecified bronchus or lung: Secondary | ICD-10-CM

## 2011-11-06 DIAGNOSIS — J4489 Other specified chronic obstructive pulmonary disease: Secondary | ICD-10-CM

## 2011-11-06 DIAGNOSIS — F411 Generalized anxiety disorder: Secondary | ICD-10-CM

## 2011-11-06 DIAGNOSIS — F17200 Nicotine dependence, unspecified, uncomplicated: Secondary | ICD-10-CM

## 2011-11-06 MED ORDER — TRAMADOL HCL 50 MG PO TABS: 50.0000 mg | ORAL_TABLET | Freq: Four times a day (QID) | ORAL | Status: DC | PRN

## 2011-11-06 NOTE — Assessment & Plan Note (Signed)
Chest x-ray January 25 still demonstrates a nodule of concern in the left upper lobe treatment field. Plan-she has 2 appointments listed for a CT scan in July. This is given to my staff to help her reconcile the appointment schedule.

## 2011-11-06 NOTE — Assessment & Plan Note (Signed)
And is slowly recovering from prolonged hospitalization. Medications are appropriate.

## 2011-11-06 NOTE — Assessment & Plan Note (Signed)
Hospitalized as noted. Back to baseline now, oxygen dependent and chronically anxious. She has a sample of [name] but demonstrates inability to inhale adequately to use this medicine. She will use Spiriva.

## 2011-11-06 NOTE — Assessment & Plan Note (Signed)
Chronic anxiety

## 2011-11-06 NOTE — Assessment & Plan Note (Signed)
We addressed motivation, medical consequences and available supportive measures for smoking cessation.

## 2011-11-06 NOTE — Patient Instructions (Addendum)
Order- Need to reconcile appointment for Chest CT in July  Stop [name]. Spiriva is enough, used once daily.   Script for tramadol to use only occasionally for pain or cough  Make your appointment to get back with you primary doctor.

## 2011-11-06 NOTE — Progress Notes (Signed)
Patient ID: [name], female    DOB: [birth year], 61 y.o.   MRN: [id number]  HPI  12/09/10- 61 yoF smoker followed for COPD, tobacco abuse and lung nodule, still smoking. Complicating hx of extensive neurofibromatosis, depression, osteopenia. Last here August 11, 2010 after hosp twice for exacerbations of COPD, unable to afford meds. Oxygen dependent. During hosp CXR showed 1.4x1.0 LUL nodule.  Since last here, she had exacerbation with dyspnea while visiting sister in [place]. They admitted her to [facility] again 2-3 weeks ago- breathing better, but upped O2 to 5 L. They did needle bx of the lung nodule and told her POS for CANCER. She knows no more than that with no f/u planned at [facility].We are requesting records. She wants care here where she lives w/ husband. Very anxious- asks help for nerves- has taken xanax 1 mg in past w/o oversedation. Feels ok. Denies cough, phlegm, blood, pain or swelling. Easy DOE across room. Feet not swelling.   12/30/10- 61 yoF smoker followed for COPD, tobacco abuse , still smoking. Complicating hx of extensive neurofibromatosis, depression, osteopenia. Adenoca LUL/ XRT,  CAD, neurofibromatosis, meningioma Since last here, cytopath from [facility] confirmed needle lung bx adenoca. PET 13 suv w/ no mets. CT brain showed stable meningioma. We reviewed discussion at [facility] Conference where recognition that this lesion is on major fissure means too much lung resection for her COPD. Instead we will refer for XRT. She is trying to quit smoking. Denies chest pain. Productive cough with light brownish sputum is routine for her. Denies blood, fever, nodes or breast nodule.  Asks help for sleep, saying that xanax isn't helping.   06/13/11-  61 yoF smoker followed for COPD, tobacco abuse , still smoking. Complicating hx of extensive neurofibromatosis, depression, osteopenia. 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 Has had flu vaccine. Still smoking about 10 cigarettes a day and we discussed ways to quit including transfer to electronic cigarettes. She remains on continuous oxygen 4 L per minute/Advanced. More short of breath in the last week and a half, coughing some green or yellow sputum and feeling heavy through the chest. Denies fever, sore throat, palpitations, edema, blood. Had a MRSA abscess left axilla surgically drained. Completed antibiotics 2 weeks ago. Home health nurses packing it. We called in a prednisone taper last night for bronchitic exacerbation.  10/10/11-  61 yoF smoker followed for COPD, tobacco abuse , still smoking. Complicating hx of extensive neurofibromatosis, depression, osteopenia. Adenoca LUL/ XRT,  CAD, meningioma,  CAD, She is interested in trying again to quit smoking. Failed patches and electronic cigarettes. History of depression makes Chantix a poor choice. Home oxygen concentrator set at 5/ oxygen saturation usually 98% at home.. Coughs a little, nonproductive. Denies chest pain, blood or purulent sputum. Taking no pulmonary meds but does have a nebulizer.. CT chest 08/14/11- reviewed with her: Plan f/u CT this summer. IMPRESSION:  1. Further reduction in conspicuity of the left upper lobe nodule.  2. Cutaneous findings of neurofibromatosis.  3. Severe emphysema.  4. Stable small hepatic and splenic lesions are probably benign  but technically nonspecific; these are not associated with this  observable hypermetabolic activity on recent PET CT.  Original Report Authenticated By: Dellia Cloud, M.D.    11/06/11- 61 yoF smoker followed for COPD, tobacco abuse , still smoking. Complicating hx of extensive neurofibromatosis, depression, osteopenia. Adenoca LUL/ XRT,  CAD, meningioma,  [facility] was shown how to use Advair HFA, Spiriva, and albuterol neb tx today; However, patient is unable to use Tudorza-has a hard time breathing deep enough to  get medication out of inhaler  Hospitalized at [facility] March 12 through March 30 on teaching service for exacerbation of COPD. Has reduced smoking to 4 cigarettes a day but finds it very hard going. Has not found nicotine substitutes useful. We discussed other measures. Husband smokes at home. Complains of "lungs hurt" indicating lower anterior bilateral chest. Asks pain medication. Had been taking Percocet but ran out out pending return to her primary office followup. Discussed history of substance abuse. She states she wants DO NOT RESUSCITATE/DO NOT INTUBATE status "no matter what by family thinks". Wears oxygen at 5 L to address sense of shortness of breath which is at least partly anxiety. Chest x-ray 09/01/2011-COPD, spiculated left upper lobe nodule 1 cm/in area of prior XRT, mild bilateral opacities.  Review of Systems-See HPI Constitutional:   No-   weight loss, night sweats, fevers, chills, fatigue, lassitude. HEENT:   No-  headaches, difficulty swallowing, tooth/dental problems, sore throat,       No-  sneezing, itching, ear ache, nasal congestion, post nasal drip,  CV:  No- anginal  chest pain, orthopnea, PND, swelling in lower extremities, anasarca,  dizziness, palpitations Resp: baseline  shortness of breath with exertion or at rest.              Some   productive cough,  No non-productive cough,  No- coughing up of blood.              No-   change in color of mucus.  No- wheezing.   Skin: No-   rash or lesions. GI:  No-   heartburn, indigestion, abdominal pain, nausea, vomiting,  GU:  MS:  No-   joint pain or swelling.  Neuro-     nothing unusual Psych:  No- change in mood or affect. No depression or anxiety.  No memory loss.      Objective:   Physical Exam General- Alert, Oriented, Affect-appropriate, Distress- none acute; portable oxygen at 4 L, talkative  Skin- neurofibromatosis  Lymphadenopathy- none Head- atraumatic            Eyes- Gross vision intact, PERRLA,  conjunctivae clear secretions            Ears- Hearing, canals-normal            Nose- Clear, no-Septal dev, mucus, polyps, erosion, perforation             Throat- Mallampati II , mucosa clear , drainage- none, tonsils- atrophic Neck- flexible , trachea midline, no stridor , thyroid nl, carotid no bruit Chest - symmetrical excursion , unlabored           Heart/CV- RRR , no murmur , no gallop  , no rub, nl s1 s2                           - JVD- none , edema- none, stasis changes- none, varices- none           Lung- clear to P&A but quite distant, wheeze- none, cough- none , dullness-none, rub- none           Chest wall-  Abd- tender-no,  Br/ Gen/ Rectal- Not done, not indicated Extrem- cyanosis- none, clubbing, none, atrophy- none, strength- nl Neuro- grossly intact to observation

## 2011-12-04 ENCOUNTER — Other Ambulatory Visit: Payer: Self-pay | Admitting: Internal Medicine

## 2011-12-05 NOTE — Telephone Encounter (Signed)
Ok to refill 

## 2011-12-05 NOTE — Telephone Encounter (Signed)
Please advise if okay to refill as requested. Thanks.  

## 2011-12-18 ENCOUNTER — Other Ambulatory Visit: Payer: Self-pay | Admitting: Internal Medicine

## 2011-12-18 NOTE — Telephone Encounter (Signed)
Ok to refill 

## 2011-12-18 NOTE — Telephone Encounter (Signed)
Please advise if okay to refill. Thanks.  

## 2011-12-19 NOTE — Telephone Encounter (Signed)
Okayed yesterday

## 2011-12-19 NOTE — Telephone Encounter (Signed)
Ok to refill 

## 2012-01-12 ENCOUNTER — Other Ambulatory Visit: Payer: Self-pay | Admitting: Internal Medicine

## 2012-01-18 NOTE — Telephone Encounter (Signed)
Please advise if okay to refill. Thanks.  

## 2012-01-19 NOTE — Telephone Encounter (Signed)
Ok to fill 

## 2012-01-25 NOTE — Telephone Encounter (Signed)
What is this one asking for? 

## 2012-02-05 ENCOUNTER — Other Ambulatory Visit: Payer: Self-pay | Admitting: Internal Medicine

## 2012-02-07 ENCOUNTER — Other Ambulatory Visit: Payer: Self-pay | Admitting: Internal Medicine

## 2012-02-07 NOTE — Telephone Encounter (Signed)
This is not an internal medicine pt, please refuse and send message to pharmacy

## 2012-02-09 ENCOUNTER — Ambulatory Visit: Payer: Medicare Other | Admitting: Internal Medicine

## 2012-02-09 NOTE — Telephone Encounter (Signed)
Ok to refill 

## 2012-02-09 NOTE — Telephone Encounter (Signed)
Please advise if okay to refill as requested. Thanks.  

## 2012-02-13 ENCOUNTER — Ambulatory Visit (INDEPENDENT_AMBULATORY_CARE_PROVIDER_SITE_OTHER)
Admission: RE | Admit: 2012-02-13 | Discharge: 2012-02-13 | Disposition: A | Discharge status: Home / Self Care | Payer: Medicare Other | Source: Ambulatory Visit | Attending: Internal Medicine | Admitting: Internal Medicine

## 2012-02-13 DIAGNOSIS — C3490 Malignant neoplasm of unspecified part of unspecified bronchus or lung: Secondary | ICD-10-CM

## 2012-02-13 MED ORDER — IOHEXOL 300 MG/ML  SOLN
80.0000 mL | Freq: Once | INTRAMUSCULAR | Status: AC | PRN
  Administered 2012-02-13: 80 mL via INTRAVENOUS

## 2012-02-16 ENCOUNTER — Telehealth: Payer: Self-pay | Admitting: Internal Medicine

## 2012-02-16 NOTE — Progress Notes (Signed)
Quick Note:  LMTCB ______ 

## 2012-02-16 NOTE — Telephone Encounter (Signed)
Notes Recorded by Waymon Budge, MD on 02/13/2012 at 8:47 PM CT- nothing new or progressive (good report). There is emphysema and atherosclerosis in the coronary arteries.  I spoke with patient about results and she verbalized understanding and had no questions

## 2012-02-19 ENCOUNTER — Other Ambulatory Visit: Payer: Self-pay | Admitting: Internal Medicine

## 2012-02-19 ENCOUNTER — Other Ambulatory Visit (HOSPITAL_COMMUNITY): Payer: Medicare Other

## 2012-02-19 DIAGNOSIS — R42 Dizziness and giddiness: Secondary | ICD-10-CM

## 2012-02-19 DIAGNOSIS — H9319 Tinnitus, unspecified ear: Secondary | ICD-10-CM

## 2012-02-20 ENCOUNTER — Other Ambulatory Visit: Payer: Medicare Other

## 2012-02-26 ENCOUNTER — Other Ambulatory Visit: Payer: Medicare Other

## 2012-02-29 ENCOUNTER — Ambulatory Visit
Admission: RE | Admit: 2012-02-29 | Discharge: 2012-02-29 | Disposition: A | Discharge status: Home / Self Care | Payer: Medicare Other | Source: Ambulatory Visit | Attending: Internal Medicine | Admitting: Internal Medicine

## 2012-02-29 DIAGNOSIS — R42 Dizziness and giddiness: Secondary | ICD-10-CM

## 2012-03-07 ENCOUNTER — Encounter: Payer: Self-pay | Admitting: Internal Medicine

## 2012-03-07 ENCOUNTER — Ambulatory Visit (INDEPENDENT_AMBULATORY_CARE_PROVIDER_SITE_OTHER): Payer: Medicare Other | Admitting: Internal Medicine

## 2012-03-07 VITALS — BP 112/78 | HR 101 | Ht 63.0 in | Wt 129.2 lb

## 2012-03-07 DIAGNOSIS — J449 Chronic obstructive pulmonary disease, unspecified: Secondary | ICD-10-CM

## 2012-03-07 DIAGNOSIS — C3490 Malignant neoplasm of unspecified part of unspecified bronchus or lung: Secondary | ICD-10-CM

## 2012-03-07 DIAGNOSIS — F17200 Nicotine dependence, unspecified, uncomplicated: Secondary | ICD-10-CM

## 2012-03-07 NOTE — Progress Notes (Signed)
Patient ID: [name], female    DOB: [birth year], 61 y.o.   MRN: [id number]  HPI  12/09/10- 72 yoF smoker followed for COPD, tobacco abuse and lung nodule, still smoking. Complicating hx of extensive neurofibromatosis, depression, osteopenia. Last here August 11, 2010 after hosp twice for exacerbations of COPD, unable to afford meds. Oxygen dependent. During hosp CXR showed 1.4x1.0 LUL nodule.  Since last here, she had exacerbation with dyspnea while visiting sister in [place]. They admitted her to [facility] again 2-3 weeks ago- breathing better, but upped O2 to 5 L. They did needle bx of the lung nodule and told her POS for CANCER. She knows no more than that with no f/u planned at [facility].We are requesting records. She wants care here where she lives w/ husband. Very anxious- asks help for nerves- has taken xanax 1 mg in past w/o oversedation. Feels ok. Denies cough, phlegm, blood, pain or swelling. Easy DOE across room. Feet not swelling.   12/30/10- 59 yoF smoker followed for COPD, tobacco abuse , still smoking. Complicating hx of extensive neurofibromatosis, depression, osteopenia. Adenoca LUL/ XRT,  CAD, neurofibromatosis, meningioma Since last here, cytopath from [facility] confirmed needle lung bx adenoca. PET 13 suv w/ no mets. CT brain showed stable meningioma. We reviewed discussion at [facility]iology Conference where recognition that this lesion is on major fissure means too much lung resection for her COPD. Instead we will refer for XRT. She is trying to quit smoking. Denies chest pain. Productive cough with light brownish sputum is routine for her. Denies blood, fever, nodes or breast nodule.  Asks help for sleep, saying that xanax isn't helping.   06/13/11-  59 yoF smoker followed for COPD, tobacco abuse , still smoking. Complicating hx of extensive neurofibromatosis, depression, osteopenia. 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 Has had flu vaccine. Still smoking about 10 cigarettes a day and we discussed ways to quit including transfer to electronic cigarettes. She remains on continuous oxygen 4 L per minute/Advanced. More short of breath in the last week and a half, coughing some green or yellow sputum and feeling heavy through the chest. Denies fever, sore throat, palpitations, edema, blood. Had a MRSA abscess left axilla surgically drained. Completed antibiotics 2 weeks ago. Home health nurses packing it. We called in a prednisone taper last night for bronchitic exacerbation.  10/10/11-  59 yoF smoker followed for COPD, tobacco abuse , still smoking. Complicating hx of extensive neurofibromatosis, depression, osteopenia. Adenoca LUL/ XRT,  CAD, meningioma,  CAD, She is interested in trying again to quit smoking. Failed patches and electronic cigarettes. History of depression makes Chantix a poor choice. Home oxygen concentrator set at 5/ oxygen saturation usually 98% at home.. Coughs a little, nonproductive. Denies chest pain, blood or purulent sputum. Taking no pulmonary meds but does have a nebulizer.. CT chest 08/14/11- reviewed with her: Plan f/u CT this summer. IMPRESSION:  1. Further reduction in conspicuity of the left upper lobe nodule.  2. Cutaneous findings of neurofibromatosis.  3. Severe emphysema.  4. Stable small hepatic and splenic lesions are probably benign  but technically nonspecific; these are not associated with this  observable hypermetabolic activity on recent PET CT.  Original Report Authenticated By: Dellia Cloud, M.D.    11/06/11- 60 yoF smoker followed for COPD, tobacco abuse , still smoking. Complicating hx of extensive neurofibromatosis, depression, osteopenia. Adenoca LUL/ XRT,  CAD, meningioma,  [facility] was shown how to use Advair HFA, Spiriva, and albuterol neb tx today; However, patient is unable to use Tudorza-has a hard time breathing deep enough to  get medication out of inhaler  Hospitalized at [facility] March 12 through March 30 on teaching service for exacerbation of COPD. Has reduced smoking to 4 cigarettes a day but finds it very hard going. Has not found nicotine substitutes useful. We discussed other measures. Husband smokes at home. Complains of "lungs hurt" indicating lower anterior bilateral chest. Asks pain medication. Had been taking Percocet but ran out out pending return to her primary office followup. Discussed history of substance abuse. She states she wants DO NOT RESUSCITATE/DO NOT INTUBATE status "no matter what my family thinks". Wears oxygen at 5 L to address sense of shortness of breath which is at least partly anxiety. Chest x-ray 09/01/2011-COPD, spiculated left upper lobe nodule 1 cm/in area of prior XRT, mild bilateral opacities.  03/07/12- 59 yoF smoker followed for COPD, tobacco abuse , still smoking. Complicating hx of extensive neurofibromatosis, depression, osteopenia. Adenoca LUL/ XRT,  CAD, meningioma,  [facility] (DNR/DNI 11/06/11). Breathing(SOB) getting worse; stays in bed all the time; K+ stays too high . COPD assessment test (CAT) scored 19/40 Her physician did lab work and told her potassium was too high-for dietary management. She stays in a hospital bed much of each day watching TV. Remains on oxygen 5 L/Advanced. Mentions spinal cord stimulator for pain CT chest 02/29/12-images reviewed with her IMPRESSION:  1. Stable CT of the chest. No new or progressive disease  identified.  2. Emphysema  3. Prominent coronary artery calcifications.   Review of Systems-See HPI Constitutional:   No-   weight loss, night sweats, fevers, chills, fatigue, lassitude. HEENT:   No-  headaches, difficulty swallowing, tooth/dental problems, sore throat,       No-  sneezing, itching, ear ache, nasal congestion, post nasal drip,  CV:  No- anginal  chest pain, orthopnea, PND, swelling in lower extremities, anasarca,  dizziness,  palpitations Resp: +baseline shortness of breath with exertion or at rest.              Some  productive cough,  No non-productive cough,  No- coughing up of blood.              No-   change in color of mucus.  No- wheezing.   Skin: No change in fibromatosis GI:  No-   heartburn, indigestion, abdominal pain, nausea, vomiting,  GU:  MS:  No-   joint pain or swelling.  Neuro-     nothing unusual Psych:  No- change in mood or affect. No depression or anxiety.  No memory loss.  Objective:   Physical Exam General- Alert, Oriented, Affect-appropriate, Distress- none acute; portable oxygen at 5 L, talkative  Skin- neurofibromatosis  Lymphadenopathy- none Head- atraumatic            Eyes- Gross vision intact, PERRLA, conjunctivae clear secretions            Ears- Hearing, canals-normal            Nose- Clear, no-Septal dev, mucus, polyps, erosion, perforation             Throat- Mallampati II , mucosa clear , drainage- none, tonsils- atrophic Neck- flexible , trachea midline, no stridor , thyroid nl, carotid no bruit Chest - symmetrical excursion , unlabored           Heart/CV- RRR , no murmur ,  no gallop  , no rub, nl s1 s2                           - JVD- none , edema- none, stasis changes- none, varices- none           Lung- clear to P&A but quite distant, wheeze- none, cough- none , dullness-none, rub- none           Chest wall-  Abd- tender-no,  Br/ Gen/ Rectal- Not done, not indicated Extrem- cyanosis- none, clubbing, none, atrophy- none, strength- nl Neuro- grossly intact to observation

## 2012-03-13 NOTE — Assessment & Plan Note (Signed)
She has not responded to my efforts towards smoking cessation or our discussion of available support.

## 2012-03-13 NOTE — Assessment & Plan Note (Signed)
Severe COPD with chronic hypoxic respiratory failure and ongoing smoking. No acute change. We discussed medications. We discussed some end-of-life issues. She does not feel she is ready for hospice

## 2012-03-13 NOTE — Assessment & Plan Note (Signed)
No obvious recurrence with ongoing followup.

## 2012-03-13 NOTE — Patient Instructions (Signed)
It remains very important that you stop smoking-please consider our discussions.

## 2012-03-21 ENCOUNTER — Telehealth: Payer: Self-pay | Admitting: Internal Medicine

## 2012-03-21 NOTE — Telephone Encounter (Signed)
lmomtcb x1 

## 2012-03-21 NOTE — Telephone Encounter (Signed)
I spoke with pt and she c/o productive cough x 2 days--getting up green phlem and is wheezing. Denies any increase SOB, chest tx, f/c/s/n/v. She is wanting an abx called in for her. Pt last OV 03/07/12. Please advise Dr. Maple Hudson thanks  Allergies  Allergen Reactions  . Penicillins Rash

## 2012-03-21 NOTE — Telephone Encounter (Signed)
Per Dr. Maple Hudson, Zpack

## 2012-03-22 MED ORDER — AZITHROMYCIN 250 MG PO TABS: ORAL_TABLET | ORAL | Status: AC

## 2012-03-22 NOTE — Telephone Encounter (Signed)
rx sent. Pt is aware. [name], CMA  

## 2012-04-21 ENCOUNTER — Emergency Department (HOSPITAL_COMMUNITY): Payer: Medicare Other

## 2012-04-21 ENCOUNTER — Emergency Department (HOSPITAL_COMMUNITY)
Admission: EM | Admit: 2012-04-21 | Discharge: 2012-04-21 | Disposition: A | Discharge status: Home / Self Care | Payer: Medicare Other | Attending: Emergency Medicine | Admitting: Emergency Medicine

## 2012-04-21 DIAGNOSIS — Z79899 Other long term (current) drug therapy: Secondary | ICD-10-CM | POA: Insufficient documentation

## 2012-04-21 DIAGNOSIS — I509 Heart failure, unspecified: Secondary | ICD-10-CM | POA: Insufficient documentation

## 2012-04-21 DIAGNOSIS — J4489 Other specified chronic obstructive pulmonary disease: Secondary | ICD-10-CM | POA: Insufficient documentation

## 2012-04-21 DIAGNOSIS — I10 Essential (primary) hypertension: Secondary | ICD-10-CM

## 2012-04-21 DIAGNOSIS — M810 Age-related osteoporosis without current pathological fracture: Secondary | ICD-10-CM | POA: Insufficient documentation

## 2012-04-21 DIAGNOSIS — F341 Dysthymic disorder: Secondary | ICD-10-CM | POA: Insufficient documentation

## 2012-04-21 DIAGNOSIS — R079 Chest pain, unspecified: Secondary | ICD-10-CM | POA: Insufficient documentation

## 2012-04-21 DIAGNOSIS — R51 Headache: Secondary | ICD-10-CM | POA: Insufficient documentation

## 2012-04-21 DIAGNOSIS — Z85118 Personal history of other malignant neoplasm of bronchus and lung: Secondary | ICD-10-CM | POA: Insufficient documentation

## 2012-04-21 DIAGNOSIS — F17200 Nicotine dependence, unspecified, uncomplicated: Secondary | ICD-10-CM | POA: Insufficient documentation

## 2012-04-21 DIAGNOSIS — J449 Chronic obstructive pulmonary disease, unspecified: Secondary | ICD-10-CM | POA: Insufficient documentation

## 2012-04-21 LAB — CBC WITH DIFFERENTIAL/PLATELET
Basophils Relative: 1 % (ref 0–1)
HCT: 39.6 % (ref 36.0–46.0)
Hemoglobin: 13.3 g/dL (ref 12.0–15.0)
Lymphocytes Relative: 10 % — ABNORMAL LOW (ref 12–46)
MCHC: 33.6 g/dL (ref 30.0–36.0)
MCV: 94.3 fL (ref 78.0–100.0)
Monocytes Absolute: 0.5 10*3/uL (ref 0.1–1.0)
Monocytes Relative: 8 % (ref 3–12)
Neutro Abs: 5.2 10*3/uL (ref 1.7–7.7)

## 2012-04-21 LAB — POCT I-STAT, CHEM 8
BUN: 9 mg/dL (ref 6–23)
Calcium, Ion: 1.23 mmol/L (ref 1.13–1.30)
Chloride: 105 mEq/L (ref 96–112)
Glucose, Bld: 89 mg/dL (ref 70–99)

## 2012-04-21 MED ORDER — ONDANSETRON 4 MG PO TBDP
8.0000 mg | ORAL_TABLET | Freq: Once | ORAL | Status: AC
  Administered 2012-04-21: 8 mg via ORAL
  Filled 2012-04-21: qty 2

## 2012-04-21 MED ORDER — METOCLOPRAMIDE HCL 10 MG PO TABS
10.0000 mg | ORAL_TABLET | Freq: Once | ORAL | Status: AC
  Administered 2012-04-21: 10 mg via ORAL
  Filled 2012-04-21: qty 1

## 2012-04-21 MED ORDER — KETOROLAC TROMETHAMINE 30 MG/ML IJ SOLN
30.0000 mg | Freq: Once | INTRAMUSCULAR | Status: AC
  Administered 2012-04-21: 30 mg via INTRAMUSCULAR

## 2012-04-21 MED ORDER — KETOROLAC TROMETHAMINE 30 MG/ML IJ SOLN
30.0000 mg | Freq: Once | INTRAMUSCULAR | Status: DC
  Filled 2012-04-21: qty 1

## 2012-04-21 MED ORDER — DIPHENHYDRAMINE HCL 25 MG PO CAPS
25.0000 mg | ORAL_CAPSULE | Freq: Once | ORAL | Status: AC
  Administered 2012-04-21: 25 mg via ORAL
  Filled 2012-04-21: qty 1

## 2012-04-21 MED ORDER — OXYCODONE-ACETAMINOPHEN 5-325 MG PO TABS: 1.0000 | ORAL_TABLET | Freq: Once | ORAL | Status: DC

## 2012-04-21 MED ORDER — IBUPROFEN 200 MG PO TABS: 600.0000 mg | ORAL_TABLET | Freq: Once | ORAL | Status: DC

## 2012-04-21 MED ORDER — LISINOPRIL 10 MG PO TABS
10.0000 mg | ORAL_TABLET | Freq: Once | ORAL | Status: AC
  Administered 2012-04-21: 10 mg via ORAL
  Filled 2012-04-21: qty 1

## 2012-04-21 NOTE — ED Notes (Signed)
Pt in CT scan at this time

## 2012-04-21 NOTE — ED Notes (Signed)
X 1 week of moderated h/a. And sob. On oxygen at 5l at home. Chronic cough. Copd and emphysema.

## 2012-04-21 NOTE — ED Provider Notes (Signed)
History     CSN: [id number]  Arrival date & time 04/21/12  1659   First MD Initiated Contact with Patient 04/21/12 1851      Chief Complaint  Patient presents with  . Headache  . Hypertension    (Consider location/radiation/quality/duration/timing/severity/associated sxs/prior treatment) Patient is a 61 y.o. female presenting with headaches and hypertension. The history is provided by the patient. No language interpreter was used.  Headache  This is a recurrent problem. The problem occurs constantly. The problem has been gradually worsening. The quality of the pain is described as throbbing. The pain is at a severity of 8/10. The pain is moderate. Associated symptoms include shortness of breath. Pertinent negatives include no fever, no chest pressure, no near-syncope, no nausea and no vomiting. She has tried oral narcotic analgesics for the symptoms.  Hypertension Associated symptoms include headaches. Pertinent negatives include no abdominal pain, chest pain, fever, nausea, vomiting or weakness.   61 year old female coming in with complaint of intermittent headache time one week with photophobia but no nausea and vomiting. States that the pain is in the frontal region and it is bilateral. Patient's taken Percocet earlier today with no relief. Patient does have lung cancer with no recent CT of the head. Patient's PCP is Dr. Renato Gails at [facility] in your care. Patient states that her blood pressure has been elevated today as well. Denies missing any of her medication doses. Neuro intact. Patient is normally short of breath and has 5 L nasal cannula at home.  Past Medical History  Diagnosis Date  . DJD (degenerative joint disease)   . COPD (chronic obstructive pulmonary disease)   . Tobacco dependence 2000    low back  . Avascular necrosis   . Osteoporosis   . Substance abuse   . Neurofibromatosis   . Hyperplastic colonic polyp   . CHF (congestive heart failure)   . Abnormal weight loss     . Essential hypertension, benign   . Depressive disorder, not elsewhere classified   . Constipation   . Low back pain   . Anxiety   . Lung cancer   . Shortness of breath     Past Surgical History  Procedure Date  . Laminectomy 02/2003  . Back surgery     multiple  . Tubal ligation   . Vesicovaginal fistula closure w/ tah   . Cholecystectomy   . Tonsilectomy, adenoidectomy, bilateral myringotomy and tubes   . Hip surgery     right hip, metal plate    Family History  Problem Relation Age of Onset  . Lung cancer Father   . Emphysema Mother   . Neurofibromatosis Mother   . Liver disease      History  Substance Use Topics  . Smoking status: Current Some Day Smoker -- 1.0 packs/day for 35 years    Types: Cigarettes  . Smokeless tobacco: Never Used  . Alcohol Use: No    OB History    Grav Para Term Preterm Abortions TAB SAB Ect Mult Living                  Review of Systems  Constitutional: Negative.  Negative for fever.  Eyes: Positive for photophobia. Negative for pain, redness, itching and visual disturbance.  Respiratory: Positive for shortness of breath.   Cardiovascular: Negative.  Negative for chest pain and near-syncope.  Gastrointestinal: Negative.  Negative for nausea, vomiting, abdominal pain, blood in stool and abdominal distention.  Musculoskeletal: Negative for back pain and gait  problem.  Neurological: Positive for headaches. Negative for weakness.  Psychiatric/Behavioral: Negative.   All other systems reviewed and are negative.    Allergies  Penicillins  Home Medications   Current Outpatient Rx  Name Route Sig Dispense Refill  . ALBUTEROL SULFATE (5 MG/ML) 0.5% IN NEBU Nebulization Take 2.5 mg by nebulization every 6 (six) hours as needed. For shortness of breath    . ALPRAZOLAM 0.5 MG PO TABS Oral Take 0.5 mg by mouth 4 (four) times daily as needed. For anxiety    . FLUTICASONE-SALMETEROL 115-21 MCG/ACT IN AERO Inhalation Inhale 2 puffs  into the lungs 2 (two) times daily. 1 Inhaler 12  . LISINOPRIL 5 MG PO TABS Oral Take 10 mg by mouth at bedtime.     Marland Kitchen MIRTAZAPINE 7.5 MG PO TABS Oral Take 1 tablet (7.5 mg total) by mouth at bedtime. 30 tablet 3  . OXYCODONE-ACETAMINOPHEN 7.5-325 MG PO TABS Oral Take 1 tablet by mouth Three times daily as needed. For pain    . QUETIAPINE FUMARATE 200 MG PO TABS Oral Take 200 mg by mouth at bedtime.    Marland Kitchen TIOTROPIUM BROMIDE MONOHYDRATE 18 MCG IN CAPS Inhalation Place 1 capsule (18 mcg total) into inhaler and inhale daily. 30 capsule 12  . ALBUTEROL SULFATE HFA 108 (90 BASE) MCG/ACT IN AERS Inhalation Inhale 2 puffs into the lungs 2 (two) times daily as needed. For shortness of breath      BP 158/88  Pulse 87  Temp 98 F (36.7 C) (Oral)  Resp 16  SpO2 100%  Physical Exam  Nursing note and vitals reviewed. Constitutional: She is oriented to person, place, and time. She appears well-developed and well-nourished.  HENT:  Head: Normocephalic and atraumatic.  Eyes: Conjunctivae normal and EOM are normal. Pupils are equal, round, and reactive to light.  Neck: Normal range of motion. Neck supple.  Cardiovascular: Normal rate.   Pulmonary/Chest: Effort normal. No respiratory distress. She has decreased breath sounds in the right lower field and the left lower field.  Abdominal: Soft. Bowel sounds are normal. She exhibits no distension.  Musculoskeletal: Normal range of motion. She exhibits no edema and no tenderness.  Neurological: She is alert and oriented to person, place, and time. She has normal strength and normal reflexes. No cranial nerve deficit or sensory deficit. She displays a negative Romberg sign. GCS eye subscore is 4. GCS verbal subscore is 5. GCS motor subscore is 6.  Skin: Skin is warm and dry.  Psychiatric: She has a normal mood and affect.    ED Course  Procedures (including critical care time)  Labs Reviewed  CBC WITH DIFFERENTIAL - Abnormal; Notable for the following:     Neutrophils Relative 82 (*)     Lymphocytes Relative 10 (*)     Lymphs Abs 0.6 (*)     All other components within normal limits  POCT I-STAT, CHEM 8   Dg Chest 2 View  04/21/2012  *RADIOLOGY REPORT*  Clinical Data: Headache and left chest pain.  History of hypertension, emphysema and recent left lung cancer.  CHEST - 2 VIEW  Comparison: Radiographs 10/17/2011.  CT 02/13/2012.  Findings: The heart size and mediastinal contours are stable. There are diffuse pulmonary fibrotic changes.  There is concern of increased nodular density in the left suprahilar region on the frontal examination, measuring approximately 2.2 cm.  No other focal nodular densities are identified.  There is no significant pleural effusion.  Thoracic spinal stimulator is noted.  There are  multiple cutaneous nodules bilaterally.  IMPRESSION:  1.  Concern of increased nodular density in the left suprahilar region.  Cannot exclude lung cancer recurrence. 2.  Otherwise stable chronic lung disease with diffuse interstitial prominence. 3.  Neurofibromatosis.   Original Report Authenticated By: Gerrianne Scale, M.D.    Ct Head Wo Contrast  04/21/2012  *RADIOLOGY REPORT*  Clinical Data:   Lung cancer with headache.02/29/2012  CT HEAD WITHOUT CONTRAST  Technique:  Contiguous axial images were obtained from the base of the skull through the vertex without contrast.  Comparison: 02/29/2012.  Findings: There is no evidence for acute infarction, intracranial hemorrhage, hydrocephalus, or extra-axial fluid.  Stable right parietal meningioma.  Mild atrophy.  Stable sebaceous cysts. Slight chronic microvascular ischemic change.  Intact calvarium. Clear sinuses and mastoids.  Moderate vascular calcification.  IMPRESSION: Stable exam. No acute intracranial findings.  No visible intracranial metastatic disease.   Original Report Authenticated By: Elsie Stain, M.D.      No diagnosis found.    MDM  Hypertension and h/a with pmh lung cancer.    Negative CT of the head. Chest x-ray cannot exclude lun ca recurrence.  Lisinopril and migraine cocktail with good results.  Vss at discharge.  Will follow up with oncologist and pcp for hypertension.  Believe the pain was causing her bp to be elevated.  Return for acute onset severe h/a or weakness/ increased SOB.  On 5L [place] at home.   Labs Reviewed  CBC WITH DIFFERENTIAL - Abnormal; Notable for the following:    Neutrophils Relative 82 (*)     Lymphocytes Relative 10 (*)     Lymphs Abs 0.6 (*)     All other components within normal limits  POCT I-STAT, CHEM 8  LAB REPORT - SCANNED          Remi Haggard, NP 04/22/12 1705

## 2012-04-24 NOTE — ED Provider Notes (Signed)
Medical screening examination/treatment/procedure(s) were performed by non-physician practitioner and as supervising physician I was immediately available for consultation/collaboration.   Nakota Elsen, MD 04/24/12 1844 

## 2012-06-12 ENCOUNTER — Telehealth: Payer: Self-pay | Admitting: Internal Medicine

## 2012-06-12 MED ORDER — CLARITHROMYCIN 500 MG PO TABS: ORAL_TABLET | ORAL | Status: DC

## 2012-06-12 NOTE — Telephone Encounter (Signed)
Spoke with patient-states that she has a productive cough x 2 weeks with yellow to green colored phlegm. Also having chest congestion with SOB. Denies any fevers/chills, nausea or vomiting. Pt would like medication called to pharmacy. Please advise.

## 2012-06-12 NOTE — Telephone Encounter (Signed)
Offer biaxin 500 mg, # 14, 1 twice daily after food

## 2012-06-12 NOTE — Telephone Encounter (Signed)
Spoke with patient, she is aware Biaxin 500mg  #14, 1 twice a day after food will be sent into verified pharmacy--[facility]--[place].  Nothing further needed at this time.

## 2012-07-08 ENCOUNTER — Ambulatory Visit: Payer: Medicare Other | Admitting: Internal Medicine

## 2012-07-27 ENCOUNTER — Inpatient Hospital Stay (HOSPITAL_COMMUNITY)
Admission: EM | Admit: 2012-07-27 | Discharge: 2012-07-30 | DRG: 189 | Disposition: A | Discharge status: Home / Self Care | Payer: Medicare Other | Attending: Internal Medicine | Admitting: Internal Medicine

## 2012-07-27 ENCOUNTER — Emergency Department (HOSPITAL_COMMUNITY): Payer: Medicare Other

## 2012-07-27 ENCOUNTER — Encounter (HOSPITAL_COMMUNITY): Payer: Self-pay | Admitting: Family Medicine

## 2012-07-27 DIAGNOSIS — I739 Peripheral vascular disease, unspecified: Secondary | ICD-10-CM

## 2012-07-27 DIAGNOSIS — E875 Hyperkalemia: Secondary | ICD-10-CM

## 2012-07-27 DIAGNOSIS — F17200 Nicotine dependence, unspecified, uncomplicated: Secondary | ICD-10-CM | POA: Diagnosis present

## 2012-07-27 DIAGNOSIS — I509 Heart failure, unspecified: Secondary | ICD-10-CM | POA: Diagnosis present

## 2012-07-27 DIAGNOSIS — M1990 Unspecified osteoarthritis, unspecified site: Secondary | ICD-10-CM | POA: Diagnosis present

## 2012-07-27 DIAGNOSIS — F329 Major depressive disorder, single episode, unspecified: Secondary | ICD-10-CM

## 2012-07-27 DIAGNOSIS — J449 Chronic obstructive pulmonary disease, unspecified: Secondary | ICD-10-CM | POA: Diagnosis present

## 2012-07-27 DIAGNOSIS — J8410 Pulmonary fibrosis, unspecified: Secondary | ICD-10-CM | POA: Diagnosis present

## 2012-07-27 DIAGNOSIS — M810 Age-related osteoporosis without current pathological fracture: Secondary | ICD-10-CM | POA: Diagnosis present

## 2012-07-27 DIAGNOSIS — R519 Headache, unspecified: Secondary | ICD-10-CM | POA: Diagnosis present

## 2012-07-27 DIAGNOSIS — F411 Generalized anxiety disorder: Secondary | ICD-10-CM

## 2012-07-27 DIAGNOSIS — R55 Syncope and collapse: Secondary | ICD-10-CM

## 2012-07-27 DIAGNOSIS — F3289 Other specified depressive episodes: Secondary | ICD-10-CM | POA: Diagnosis present

## 2012-07-27 DIAGNOSIS — L02412 Cutaneous abscess of left axilla: Secondary | ICD-10-CM

## 2012-07-27 DIAGNOSIS — C3490 Malignant neoplasm of unspecified part of unspecified bronchus or lung: Secondary | ICD-10-CM | POA: Diagnosis present

## 2012-07-27 DIAGNOSIS — J4489 Other specified chronic obstructive pulmonary disease: Secondary | ICD-10-CM | POA: Diagnosis present

## 2012-07-27 DIAGNOSIS — D126 Benign neoplasm of colon, unspecified: Secondary | ICD-10-CM

## 2012-07-27 DIAGNOSIS — Z9981 Dependence on supplemental oxygen: Secondary | ICD-10-CM

## 2012-07-27 DIAGNOSIS — R0902 Hypoxemia: Secondary | ICD-10-CM | POA: Diagnosis present

## 2012-07-27 DIAGNOSIS — I10 Essential (primary) hypertension: Secondary | ICD-10-CM | POA: Diagnosis present

## 2012-07-27 DIAGNOSIS — Q8500 Neurofibromatosis, unspecified: Secondary | ICD-10-CM | POA: Diagnosis present

## 2012-07-27 DIAGNOSIS — M545 Low back pain: Secondary | ICD-10-CM

## 2012-07-27 DIAGNOSIS — J441 Chronic obstructive pulmonary disease with (acute) exacerbation: Secondary | ICD-10-CM

## 2012-07-27 DIAGNOSIS — J9620 Acute and chronic respiratory failure, unspecified whether with hypoxia or hypercapnia: Principal | ICD-10-CM | POA: Diagnosis present

## 2012-07-27 DIAGNOSIS — Z66 Do not resuscitate: Secondary | ICD-10-CM | POA: Diagnosis present

## 2012-07-27 DIAGNOSIS — Z862 Personal history of diseases of the blood and blood-forming organs and certain disorders involving the immune mechanism: Secondary | ICD-10-CM

## 2012-07-27 DIAGNOSIS — F1910 Other psychoactive substance abuse, uncomplicated: Secondary | ICD-10-CM

## 2012-07-27 DIAGNOSIS — Z79899 Other long term (current) drug therapy: Secondary | ICD-10-CM

## 2012-07-27 DIAGNOSIS — R51 Headache: Secondary | ICD-10-CM | POA: Diagnosis present

## 2012-07-27 DIAGNOSIS — Z85118 Personal history of other malignant neoplasm of bronchus and lung: Secondary | ICD-10-CM

## 2012-07-27 LAB — CBC WITH DIFFERENTIAL/PLATELET
Eosinophils Absolute: 0.1 10*3/uL (ref 0.0–0.7)
Eosinophils Relative: 1 % (ref 0–5)
HCT: 36.9 % (ref 36.0–46.0)
Hemoglobin: 12.3 g/dL (ref 12.0–15.0)
Lymphocytes Relative: 14 % (ref 12–46)
Lymphs Abs: 0.9 10*3/uL (ref 0.7–4.0)
MCH: 31.3 pg (ref 26.0–34.0)
MCV: 93.9 fL (ref 78.0–100.0)
Monocytes Absolute: 0.5 10*3/uL (ref 0.1–1.0)
Monocytes Relative: 7 % (ref 3–12)
Platelets: 259 10*3/uL (ref 150–400)
WBC: 6.4 10*3/uL (ref 4.0–10.5)

## 2012-07-27 LAB — URINALYSIS, ROUTINE W REFLEX MICROSCOPIC
Bilirubin Urine: NEGATIVE
Glucose, UA: NEGATIVE mg/dL
Hgb urine dipstick: NEGATIVE
Protein, ur: NEGATIVE mg/dL
Urobilinogen, UA: 0.2 mg/dL (ref 0.0–1.0)

## 2012-07-27 LAB — BASIC METABOLIC PANEL
BUN: 19 mg/dL (ref 6–23)
CO2: 27 mEq/L (ref 19–32)
Calcium: 9.1 mg/dL (ref 8.4–10.5)
Creatinine, Ser: 0.9 mg/dL (ref 0.50–1.10)
GFR calc non Af Amer: 67 mL/min — ABNORMAL LOW (ref >90)
Glucose, Bld: 89 mg/dL (ref 70–99)

## 2012-07-27 LAB — POCT I-STAT TROPONIN I

## 2012-07-27 LAB — GLUCOSE, CAPILLARY: Glucose-Capillary: 97 mg/dL (ref 70–99)

## 2012-07-27 MED ORDER — NICOTINE 14 MG/24HR TD PT24
14.0000 mg | MEDICATED_PATCH | TRANSDERMAL | Status: AC
  Filled 2012-07-27: qty 1

## 2012-07-27 MED ORDER — ALBUTEROL SULFATE (5 MG/ML) 0.5% IN NEBU: 2.5000 mg | INHALATION_SOLUTION | RESPIRATORY_TRACT | Status: AC | PRN

## 2012-07-27 MED ORDER — MORPHINE SULFATE 4 MG/ML IJ SOLN
4.0000 mg | Freq: Once | INTRAMUSCULAR | Status: AC
  Administered 2012-07-27: 4 mg via INTRAVENOUS
  Filled 2012-07-27: qty 1

## 2012-07-27 MED ORDER — SODIUM CHLORIDE 0.9 % IV BOLUS (SEPSIS)
1000.0000 mL | Freq: Once | INTRAVENOUS | Status: AC
  Administered 2012-07-27: 1000 mL via INTRAVENOUS

## 2012-07-27 MED ORDER — ONDANSETRON HCL 4 MG/2ML IJ SOLN: 4.0000 mg | Freq: Three times a day (TID) | INTRAMUSCULAR | Status: AC | PRN

## 2012-07-27 MED ORDER — ONDANSETRON HCL 4 MG/2ML IJ SOLN
4.0000 mg | Freq: Once | INTRAMUSCULAR | Status: AC
  Administered 2012-07-27: 4 mg via INTRAVENOUS
  Filled 2012-07-27: qty 2

## 2012-07-27 MED ORDER — SODIUM CHLORIDE 0.9 % IV SOLN: INTRAVENOUS | Status: DC

## 2012-07-27 MED ORDER — IOHEXOL 300 MG/ML  SOLN
75.0000 mL | Freq: Once | INTRAMUSCULAR | Status: AC | PRN
  Administered 2012-07-27: 75 mL via INTRAVENOUS

## 2012-07-27 MED ORDER — HYDROMORPHONE HCL PF 1 MG/ML IJ SOLN: 1.0000 mg | INTRAMUSCULAR | Status: AC | PRN

## 2012-07-27 MED ORDER — LORAZEPAM 2 MG/ML IJ SOLN
1.0000 mg | Freq: Once | INTRAMUSCULAR | Status: AC
  Administered 2012-07-27: 1 mg via INTRAVENOUS
  Filled 2012-07-27: qty 1

## 2012-07-27 NOTE — ED Notes (Signed)
Per pt HA since she woke up this am. sts had a syncopal episode at home.

## 2012-07-27 NOTE — ED Provider Notes (Signed)
History     CSN: [id number]  Arrival date & time 07/27/12  1713   First MD Initiated Contact with Patient 07/27/12 1854      Chief Complaint  Patient presents with  . Headache    (Consider location/radiation/quality/duration/timing/severity/associated sxs/prior treatment) HPI  61 year old female with history of lung cancer, severe COPD, and neurofibromatosis presents for evaluation of headache and syncope.  Patient's report she has a history of chronic headache in which she takes Percocet for it. This morning she noticed that her headache is worse and while attempting to walk to the bathroom she had a syncopal episode and was found face first on the carpet floor by her daughter who lives with her. She does not recall any precipitating symptoms prior to her syncopal episode. Denies history of syncope. Her headache is worsened after the fall. Describe headaches as a throbbing sensation from the back the head which radiates towards the front, similar to prior headaches except more intense.   She denies fever, chills, double vision, acute vision loss, nausea, vomiting, diarrhea, chest pain, worsening shortness of breath, abdominal pain, dysuria, rash. She has been eating and drinking as usual. She continues to smoke. Is on home O2 @ 5L.  She felt unsafe to be at home because she's afraid she may pass out again.  Hx of lung cancer with 5 treatments of radiation last year.    Past Medical History  Diagnosis Date  . DJD (degenerative joint disease)   . COPD (chronic obstructive pulmonary disease)   . Tobacco dependence 2000    low back  . Avascular necrosis   . Osteoporosis   . Substance abuse   . 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 Hyperplastic colonic polyp   . CHF (congestive heart failure)   . Abnormal weight loss   . Essential hypertension, benign   . Depressive disorder, not elsewhere classified   . Constipation   . Low back pain   . Anxiety   . Lung cancer   . Shortness of breath      Past Surgical History  Procedure Date  . Laminectomy 02/2003  . Back surgery     multiple  . Tubal ligation   . Vesicovaginal fistula closure w/ tah   . Cholecystectomy   . Tonsilectomy, adenoidectomy, bilateral myringotomy and tubes   . Hip surgery     right hip, metal plate    Family History  Problem Relation Age of Onset  . Lung cancer Father   . Emphysema Mother   . Neurofibromatosis Mother   . Liver disease      History  Substance Use Topics  . Smoking status: Current Some Day Smoker -- 1.0 packs/day for 35 years    Types: Cigarettes  . Smokeless tobacco: Never Used  . Alcohol Use: No    OB History    Grav Para Term Preterm Abortions TAB SAB Ect Mult Living                  Review of Systems  Constitutional: Negative for fever and chills.  Respiratory: Positive for shortness of breath.   Cardiovascular: Negative for chest pain.  Gastrointestinal: Negative for abdominal pain.  Skin: Negative for rash and wound.  Neurological: Positive for headaches. Negative for light-headedness and numbness.    Allergies  Penicillins  Home Medications   Current Outpatient Rx  Name  Route  Sig  Dispense  Refill  . ALBUTEROL SULFATE HFA 108 (90 BASE) MCG/ACT IN AERS   Inhalation  Inhale 2 puffs into the lungs 2 (two) times daily as needed. For shortness of breath         . ALPRAZOLAM 0.5 MG PO TABS   Oral   Take 0.5 mg by mouth 4 (four) times daily as needed. For anxiety         . EXCEDRIN PO   Oral   Take 1 tablet by mouth every 6 (six) hours as needed. For headache         . ATORVASTATIN CALCIUM 10 MG PO TABS   Oral   Take 10 mg by mouth daily.          Marland Kitchen CLARITHROMYCIN 500 MG PO TABS      1 tablet twice a day after meals   14 tablet   0   . LISINOPRIL 5 MG PO TABS   Oral   Take 10 mg by mouth at bedtime.          Alfonso Patten 1 MG PO TABS   Oral   Take 1 mg by mouth at bedtime.          Marland Kitchen MIRTAZAPINE 7.5 MG PO TABS   Oral   Take 1  tablet (7.5 mg total) by mouth at bedtime.   30 tablet   3   . OXYCODONE-ACETAMINOPHEN 7.5-325 MG PO TABS   Oral   Take 1 tablet by mouth Three times daily as needed. For pain         . QUETIAPINE FUMARATE 200 MG PO TABS   Oral   Take 200 mg by mouth at bedtime.         Marland Kitchen TIOTROPIUM BROMIDE MONOHYDRATE 18 MCG IN CAPS   Inhalation   Place 1 capsule (18 mcg total) into inhaler and inhale daily.   30 capsule   12     BP 104/62  Pulse 92  Temp 98 F (36.7 C)  Resp 20  SpO2 100%  Physical Exam  Nursing note and vitals reviewed. Constitutional: She is oriented to person, place, and time. She appears well-developed and well-nourished. No distress.  HENT:  Head: Normocephalic and atraumatic.  Right Ear: External ear normal.  Left Ear: External ear normal.       Mild tenderness to right temporal region without deformity, or overlying skin changes.  No hemotympanum, no septal hematoma, no midface tenderness, no malocclusion.  Eyes: Conjunctivae normal and EOM are normal. Pupils are equal, round, and reactive to light.  Neck: Normal range of motion. Neck supple.  Cardiovascular: Exam reveals no gallop and no friction rub.   No murmur heard.      Mild tachycardic  Pulmonary/Chest: Effort normal. She has rales (Rales best heard at both lower lung field).  Abdominal: Soft. There is no tenderness.  Musculoskeletal: Normal range of motion. She exhibits no edema and no tenderness.  Neurological: She is alert and oriented to person, place, and time.  Skin: No rash noted.       Significant evidence of neurofibromatosis throughout entire body    ED Course  Procedures (including critical care time)  Labs Reviewed - No data to display No results found.   No diagnosis found.   Date: 07/27/2012  Rate: 88  Rhythm: normal sinus rhythm  QRS Axis: normal  Intervals: normal  ST/T Wave abnormalities: normal  Conduction Disutrbances:none  Narrative Interpretation:   Old EKG  Reviewed: unchanged    Results for orders placed during the hospital encounter of 07/27/12  CBC WITH DIFFERENTIAL  Component Value Range   WBC 6.4  4.0 - 10.5 K/uL   RBC 3.93  3.87 - 5.11 MIL/uL   Hemoglobin 12.3  12.0 - 15.0 g/dL   HCT 29.5  28.4 - 13.2 %   MCV 93.9  78.0 - 100.0 fL   MCH 31.3  26.0 - 34.0 pg   MCHC 33.3  30.0 - 36.0 g/dL   RDW 44.0  10.2 - 72.5 %   Platelets 259  150 - 400 K/uL   Neutrophils Relative 77  43 - 77 %   Neutro Abs 4.9  1.7 - 7.7 K/uL   Lymphocytes Relative 14  12 - 46 %   Lymphs Abs 0.9  0.7 - 4.0 K/uL   Monocytes Relative 7  3 - 12 %   Monocytes Absolute 0.5  0.1 - 1.0 K/uL   Eosinophils Relative 1  0 - 5 %   Eosinophils Absolute 0.1  0.0 - 0.7 K/uL   Basophils Relative 1  0 - 1 %   Basophils Absolute 0.0  0.0 - 0.1 K/uL  BASIC METABOLIC PANEL      Component Value Range   Sodium 136  135 - 145 mEq/L   Potassium 4.6  3.5 - 5.1 mEq/L   Chloride 99  96 - 112 mEq/L   CO2 27  19 - 32 mEq/L   Glucose, Bld 89  70 - 99 mg/dL   BUN 19  6 - 23 mg/dL   Creatinine, Ser 3.66  0.50 - 1.10 mg/dL   Calcium 9.1  8.4 - 44.0 mg/dL   GFR calc non Af Amer 67 (*) >90 mL/min   GFR calc Af Amer 77 (*) >90 mL/min  URINALYSIS, ROUTINE W REFLEX MICROSCOPIC      Component Value Range   Color, Urine YELLOW  YELLOW   APPearance CLEAR  CLEAR   Specific Gravity, Urine 1.015  1.005 - 1.030   pH 7.0  5.0 - 8.0   Glucose, UA NEGATIVE  NEGATIVE mg/dL   Hgb urine dipstick NEGATIVE  NEGATIVE   Bilirubin Urine NEGATIVE  NEGATIVE   Ketones, ur NEGATIVE  NEGATIVE mg/dL   Protein, ur NEGATIVE  NEGATIVE mg/dL   Urobilinogen, UA 0.2  0.0 - 1.0 mg/dL   Nitrite NEGATIVE  NEGATIVE   Leukocytes, UA NEGATIVE  NEGATIVE  GLUCOSE, CAPILLARY      Component Value Range   Glucose-Capillary 97  70 - 99 mg/dL   Comment 1 Notify RN    POCT I-STAT TROPONIN I      Component Value Range   Troponin i, poc 0.00  0.00 - 0.08 ng/mL   Comment 3            Dg Chest 2 View  07/27/2012   *RADIOLOGY REPORT*  Clinical Data: Cough, mid anterior chest pain, syncope, COPD, lung cancer, smoking history, CHF, hypertension  CHEST - 2 VIEW  Comparison: 04/21/2012  Findings: Upper normal heart size. Mediastinal contours and pulmonary vascularity normal for the degree of scoliosis. Diffuse interstitial lung disease changes are seen throughout both lungs. No definite acute infiltrate, pleural effusion or pneumothorax. Question vague nodular density at lateral left upper lobe. Bones appear severely demineralized. Intraspinal stimulator lower thoracic region.  IMPRESSION: Changes of COPD and chronic interstitial lung disease. Question vague nodular density 13 x 18 mm left upper lobe, not definitely seen on the prior exam. Recommend CT chest follow-up to exclude nodule.   Original Report Authenticated By: Ulyses Southward, M.D.    Ct  Chest W Contrast  07/27/2012  *RADIOLOGY REPORT*  Clinical Data: Shortness of breath, lung cancer  CT CHEST WITH CONTRAST  Technique:  Multidetector CT imaging of the chest was performed following the standard protocol during bolus administration of intravenous contrast.  Contrast: 75mL OMNIPAQUE IOHEXOL 300 MG/ML  SOLN 07/27/2029 radiograph  Comparison: 02/13/2012 CT  Findings: Normal caliber aorta with scattered atherosclerotic disease.  Normal heart size.  Coronary artery calcification.  Trace pericardial fluid.  No pleural effusions.  No enlarged intrathoracic lymph nodes.  Peripherally calcified left thyroid lobe lesion measures 13 mm.  Limited images through the upper abdomen show biliary ductal dilatation, with the CBD measuring 12 mm.  Distal duct is not included on the study.  The appearance is similar to prior.  Centrolobular emphysematous changes.  Central airways are patent. Bibasilar atelectasis.  No pneumothorax.  Scattered areas of scarring.  An 8 mm nodular opacity posterior left lower lobe on image 17 of series 3 is mildly increased, with increased adjacent linear  opacity, likely atelectasis or scarring.  Partially imaged neural stimulator wires.  Diffuse osteopenia. Thoracic spinal curvature.  Sequelae of prior rib fractures.  IMPRESSION: Left upper lobe nodular opacity is mildly increased in size, mild increased surrounding linear opacity.  This is nonspecific. Recommend short-term (2-3 month) follow-up.  Emphysema.  Other findings similar to prior as above.   Original Report Authenticated By: Jearld Lesch, M.D.     1. Headache 2. Syncope 3. Severe hypoxia with ambulation  MDM  Patient presents with acute on chronic headache, any recent syncopal episode. Syncope workup initiated. Pain medication given. Patient otherwise in no acute distress.   8:36 PM Normal H&H, electrolytes, orthostatic VS, CBG and VSS.  CXR shows ill define opacity.  Will f/u with chest CT since pt has hx of lung cancer.  Care discussed with my attending, who will evaluate the pt.    11:07 PM Chest CT shows nonspecific finding with recommendation to have repeat CT in 2-3 months.  Pt sts she is scheduled to f/u with her oncologist in February and will notify the need for repeat CT scan.  Pt request to be discharge, sts she feels much better.  She is on home O2 @ 5L. However pt desats to the 80s while ambulating with 6L O2.  I discussed that pt will need admission for further management.  Pt reluctantly agrees.    11:56 PM I have consulted with [facility], Dr. Lovell Sheehan who agrees to admit pt to step down, Team 10, under her care for further evaluation.  She also request a head Ct scan, which i will order.  Pt currently on 5L O2 in room, in NAD.    CRITICAL CARE Performed by: Fayrene Helper   Total critical care time:  Critical care time was exclusive of separately billable procedures and treating other patients.  Critical care was necessary to treat or prevent imminent or life-threatening deterioration.  Critical care was time spent personally by me on the  following activities: development of treatment plan with patient and/or surrogate as well as nursing, discussions with consultants, evaluation of patient's response to treatment, examination of patient, obtaining history from patient or surrogate, ordering and performing treatments and interventions, ordering and review of laboratory studies, ordering and review of radiographic studies, pulse oximetry and re-evaluation of patient's condition.  BP 126/77  Pulse 87  Temp 98 F (36.7 C)  Resp 20  SpO2 96%  I have reviewed nursing notes and vital signs. I  personally reviewed the imaging tests through PACS system  I reviewed available ER/hospitalization records thought the EMR   Fayrene Helper, [place] 07/27/12 2358

## 2012-07-27 NOTE — ED Notes (Signed)
Ambulated pt on 6liters of O2 per PA Bowie.  Pt sats dropped down to 84. Pt upset and by the time we were back in the room pt sats were at 79.

## 2012-07-27 NOTE — ED Notes (Signed)
Pt family given refreshments.  

## 2012-07-27 NOTE — ED Notes (Signed)
MD at bedside. 

## 2012-07-27 NOTE — ED Notes (Signed)
Pt transported to XR.  

## 2012-07-27 NOTE — ED Notes (Signed)
Pt in route to Xray

## 2012-07-28 ENCOUNTER — Inpatient Hospital Stay (HOSPITAL_COMMUNITY): Payer: Medicare Other

## 2012-07-28 DIAGNOSIS — I10 Essential (primary) hypertension: Secondary | ICD-10-CM

## 2012-07-28 DIAGNOSIS — R55 Syncope and collapse: Secondary | ICD-10-CM

## 2012-07-28 DIAGNOSIS — R51 Headache: Secondary | ICD-10-CM

## 2012-07-28 DIAGNOSIS — J449 Chronic obstructive pulmonary disease, unspecified: Secondary | ICD-10-CM

## 2012-07-28 DIAGNOSIS — C3490 Malignant neoplasm of unspecified part of unspecified bronchus or lung: Secondary | ICD-10-CM

## 2012-07-28 DIAGNOSIS — R0902 Hypoxemia: Secondary | ICD-10-CM

## 2012-07-28 DIAGNOSIS — F17200 Nicotine dependence, unspecified, uncomplicated: Secondary | ICD-10-CM

## 2012-07-28 DIAGNOSIS — Q8500 Neurofibromatosis, unspecified: Secondary | ICD-10-CM

## 2012-07-28 DIAGNOSIS — J9620 Acute and chronic respiratory failure, unspecified whether with hypoxia or hypercapnia: Secondary | ICD-10-CM

## 2012-07-28 LAB — CBC
HCT: 34.7 % — ABNORMAL LOW (ref 36.0–46.0)
Hemoglobin: 11.7 g/dL — ABNORMAL LOW (ref 12.0–15.0)
RBC: 3.7 MIL/uL — ABNORMAL LOW (ref 3.87–5.11)

## 2012-07-28 LAB — BASIC METABOLIC PANEL
Chloride: 101 mEq/L (ref 96–112)
GFR calc Af Amer: >90 mL/min (ref >90)
GFR calc non Af Amer: 88 mL/min — ABNORMAL LOW (ref >90)
Potassium: 4.2 mEq/L (ref 3.5–5.1)
Sodium: 137 mEq/L (ref 135–145)

## 2012-07-28 MED ORDER — SODIUM CHLORIDE 0.9 % IV SOLN
INTRAVENOUS | Status: DC
  Administered 2012-07-28: 05:00:00 via INTRAVENOUS

## 2012-07-28 MED ORDER — METHYLPREDNISOLONE SODIUM SUCC 40 MG IJ SOLR
40.0000 mg | Freq: Two times a day (BID) | INTRAMUSCULAR | Status: DC
  Administered 2012-07-28 – 2012-07-30 (×4): 40 mg via INTRAVENOUS
  Filled 2012-07-28 (×6): qty 1

## 2012-07-28 MED ORDER — QUETIAPINE FUMARATE 200 MG PO TABS
200.0000 mg | ORAL_TABLET | ORAL | Status: AC
  Administered 2012-07-28: 200 mg via ORAL
  Filled 2012-07-28: qty 1

## 2012-07-28 MED ORDER — BUDESONIDE-FORMOTEROL FUMARATE 160-4.5 MCG/ACT IN AERO
2.0000 | INHALATION_SPRAY | Freq: Two times a day (BID) | RESPIRATORY_TRACT | Status: DC
  Administered 2012-07-29 – 2012-07-30 (×2): 2 via RESPIRATORY_TRACT
  Filled 2012-07-28: qty 6

## 2012-07-28 MED ORDER — ALUM & MAG HYDROXIDE-SIMETH 200-200-20 MG/5ML PO SUSP: 30.0000 mL | Freq: Four times a day (QID) | ORAL | Status: DC | PRN

## 2012-07-28 MED ORDER — ONDANSETRON HCL 4 MG/2ML IJ SOLN: 4.0000 mg | Freq: Four times a day (QID) | INTRAMUSCULAR | Status: DC | PRN

## 2012-07-28 MED ORDER — QUETIAPINE FUMARATE 200 MG PO TABS
200.0000 mg | ORAL_TABLET | Freq: Every day | ORAL | Status: DC
  Administered 2012-07-28 – 2012-07-29 (×2): 200 mg via ORAL
  Filled 2012-07-28 (×3): qty 1

## 2012-07-28 MED ORDER — ZOLPIDEM TARTRATE 5 MG PO TABS: 5.0000 mg | ORAL_TABLET | Freq: Every evening | ORAL | Status: DC | PRN

## 2012-07-28 MED ORDER — LEVOFLOXACIN IN D5W 750 MG/150ML IV SOLN
750.0000 mg | INTRAVENOUS | Status: DC
  Filled 2012-07-28: qty 150

## 2012-07-28 MED ORDER — ACETAMINOPHEN 325 MG PO TABS
650.0000 mg | ORAL_TABLET | Freq: Four times a day (QID) | ORAL | Status: DC | PRN
  Administered 2012-07-28 – 2012-07-29 (×4): 650 mg via ORAL
  Filled 2012-07-28 (×5): qty 2

## 2012-07-28 MED ORDER — SODIUM CHLORIDE 0.9 % IJ SOLN
3.0000 mL | Freq: Two times a day (BID) | INTRAMUSCULAR | Status: DC
  Administered 2012-07-28 – 2012-07-30 (×4): 3 mL via INTRAVENOUS

## 2012-07-28 MED ORDER — ATORVASTATIN CALCIUM 10 MG PO TABS
10.0000 mg | ORAL_TABLET | Freq: Every day | ORAL | Status: DC
  Administered 2012-07-28 – 2012-07-29 (×2): 10 mg via ORAL
  Filled 2012-07-28 (×3): qty 1

## 2012-07-28 MED ORDER — MIRTAZAPINE 7.5 MG PO TABS
7.5000 mg | ORAL_TABLET | Freq: Every day | ORAL | Status: DC
  Administered 2012-07-28 – 2012-07-29 (×2): 7.5 mg via ORAL
  Filled 2012-07-28 (×4): qty 1

## 2012-07-28 MED ORDER — TIOTROPIUM BROMIDE MONOHYDRATE 18 MCG IN CAPS
18.0000 ug | ORAL_CAPSULE | Freq: Every day | RESPIRATORY_TRACT | Status: DC
  Administered 2012-07-29 – 2012-07-30 (×2): 18 ug via RESPIRATORY_TRACT
  Filled 2012-07-28: qty 5

## 2012-07-28 MED ORDER — ENOXAPARIN SODIUM 40 MG/0.4ML ~~LOC~~ SOLN
40.0000 mg | SUBCUTANEOUS | Status: DC
  Administered 2012-07-28 – 2012-07-29 (×2): 40 mg via SUBCUTANEOUS
  Filled 2012-07-28 (×3): qty 0.4

## 2012-07-28 MED ORDER — ALBUTEROL SULFATE (5 MG/ML) 0.5% IN NEBU: 2.5000 mg | INHALATION_SOLUTION | Freq: Four times a day (QID) | RESPIRATORY_TRACT | Status: DC

## 2012-07-28 MED ORDER — ACETAMINOPHEN 650 MG RE SUPP: 650.0000 mg | Freq: Four times a day (QID) | RECTAL | Status: DC | PRN

## 2012-07-28 MED ORDER — ZOLPIDEM TARTRATE 5 MG PO TABS
5.0000 mg | ORAL_TABLET | Freq: Every day | ORAL | Status: DC
  Filled 2012-07-28: qty 1

## 2012-07-28 MED ORDER — ONDANSETRON HCL 4 MG PO TABS: 4.0000 mg | ORAL_TABLET | Freq: Four times a day (QID) | ORAL | Status: DC | PRN

## 2012-07-28 MED ORDER — OXYCODONE HCL 5 MG PO TABS
5.0000 mg | ORAL_TABLET | ORAL | Status: DC | PRN
  Administered 2012-07-28 – 2012-07-30 (×8): 5 mg via ORAL
  Filled 2012-07-28 (×9): qty 1

## 2012-07-28 MED ORDER — BIOTENE DRY MOUTH MT LIQD
15.0000 mL | Freq: Two times a day (BID) | OROMUCOSAL | Status: DC
  Administered 2012-07-28: 15 mL via OROMUCOSAL

## 2012-07-28 MED ORDER — MIRTAZAPINE 7.5 MG PO TABS
7.5000 mg | ORAL_TABLET | ORAL | Status: AC
  Administered 2012-07-28: 7.5 mg via ORAL
  Filled 2012-07-28: qty 1

## 2012-07-28 MED ORDER — DEXTROSE 5 % IV SOLN
1.0000 g | INTRAVENOUS | Status: DC
  Administered 2012-07-28 – 2012-07-29 (×2): 1 g via INTRAVENOUS
  Filled 2012-07-28 (×3): qty 10

## 2012-07-28 MED ORDER — ALBUTEROL SULFATE (5 MG/ML) 0.5% IN NEBU
2.5000 mg | INHALATION_SOLUTION | Freq: Four times a day (QID) | RESPIRATORY_TRACT | Status: DC
  Administered 2012-07-28 – 2012-07-30 (×6): 2.5 mg via RESPIRATORY_TRACT
  Filled 2012-07-28 (×7): qty 0.5

## 2012-07-28 MED ORDER — HYDROCHLOROTHIAZIDE 25 MG PO TABS
25.0000 mg | ORAL_TABLET | Freq: Every day | ORAL | Status: DC
  Administered 2012-07-29 – 2012-07-30 (×2): 25 mg via ORAL
  Filled 2012-07-28 (×2): qty 1

## 2012-07-28 MED ORDER — PANTOPRAZOLE SODIUM 40 MG PO TBEC
40.0000 mg | DELAYED_RELEASE_TABLET | Freq: Two times a day (BID) | ORAL | Status: DC
  Administered 2012-07-28 – 2012-07-30 (×4): 40 mg via ORAL
  Filled 2012-07-28 (×3): qty 1

## 2012-07-28 MED ORDER — LISINOPRIL 10 MG PO TABS
10.0000 mg | ORAL_TABLET | ORAL | Status: AC
  Administered 2012-07-28: 10 mg via ORAL
  Filled 2012-07-28: qty 1

## 2012-07-28 MED ORDER — LISINOPRIL 10 MG PO TABS
10.0000 mg | ORAL_TABLET | Freq: Every day | ORAL | Status: DC
  Filled 2012-07-28: qty 1

## 2012-07-28 MED ORDER — ALPRAZOLAM 0.5 MG PO TABS
0.5000 mg | ORAL_TABLET | Freq: Four times a day (QID) | ORAL | Status: DC | PRN
  Administered 2012-07-28 – 2012-07-30 (×7): 0.5 mg via ORAL
  Filled 2012-07-28 (×7): qty 1

## 2012-07-28 MED ORDER — HYDROMORPHONE HCL PF 1 MG/ML IJ SOLN
0.5000 mg | INTRAMUSCULAR | Status: DC | PRN
  Administered 2012-07-28 – 2012-07-29 (×4): 1 mg via INTRAVENOUS
  Filled 2012-07-28 (×4): qty 1

## 2012-07-28 NOTE — H&P (Signed)
[facility] History and Physical  [name] ZOX:[id number] DOB: 08-03-1951 DOA: 07/27/2012  Referring physician: EDP PCP: Bufford Spikes, DO  Specialists:   Chief Complaint:  Passed Out Twice  HPI: [name] is a 61 y.o. female who presents to the ED after 2 episodes of passing out.  The first episode occurred when she got up to walk to her bathroom.   She reported feeling dizzy and lightheaded and then passed out.  She had a second episode.  She reports having a severe headache, and she denies having any chest pain or SOB.  She reports having similar episodes in the past.   In the ED, she was noted to have decreased oxygen saturations when she was ambulating, and she was subsequently referred for admission.     Review of Systems: The patient denies anorexia, fever, weight loss, vision loss, decreased hearing, hoarseness, chest pain, syncope, dyspnea on exertion, peripheral edema, balance deficits, hemoptysis, abdominal pain, nausea, vomiting, diarrhea, constipation, hematemesis, melena, hematochezia, severe indigestion/heartburn, hematuria, incontinence, dysuria, genital sores, muscle weakness, suspicious skin lesions, transient blindness, difficulty walking, depression, unusual weight change, abnormal bleeding, enlarged lymph nodes, angioedema, and breast masses.    Past Medical History  Diagnosis Date  . DJD (degenerative joint disease)   . COPD (chronic obstructive pulmonary disease)   . Tobacco dependence 2000    low back  . Avascular necrosis   . Osteoporosis   . Substance abuse   . 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 Hyperplastic colonic polyp   . CHF (congestive heart failure)   . Abnormal weight loss   . Essential hypertension, benign   . Depressive disorder, not elsewhere classified   . Constipation   . Low back pain   . Anxiety   . Lung cancer   . Shortness of breath    Past Surgical History  Procedure Date  . Laminectomy 02/2003  . Back surgery      multiple  . Tubal ligation   . Vesicovaginal fistula closure w/ tah   . Cholecystectomy   . Tonsilectomy, adenoidectomy, bilateral myringotomy and tubes   . Hip surgery     right hip, metal plate     Medications:  HOME MEDS: Prior to Admission medications   Medication Sig Start Date End Date Taking? Authorizing Provider  albuterol (PROVENTIL HFA;VENTOLIN HFA) 108 (90 BASE) MCG/ACT inhaler Inhale 2 puffs into the lungs 2 (two) times daily as needed. For shortness of breath   Yes Historical Provider, MD  ALPRAZolam (XANAX) 0.5 MG tablet Take 0.5 mg by mouth 4 (four) times daily as needed. For anxiety   Yes Historical Provider, MD  Aspirin-Acetaminophen-Caffeine (EXCEDRIN PO) Take 1 tablet by mouth every 6 (six) hours as needed. For headache   Yes Historical Provider, MD  atorvastatin (LIPITOR) 10 MG tablet Take 10 mg by mouth daily.  06/25/12  Yes Historical Provider, MD  clarithromycin (BIAXIN) 500 MG tablet 1 tablet twice a day after meals 06/12/12  Yes Clinton D Young, MD  lisinopril (PRINIVIL,ZESTRIL) 5 MG tablet Take 10 mg by mouth at bedtime.  04/23/11  Yes Historical Provider, MD  LUNESTA 1 MG TABS Take 1 mg by mouth at bedtime.  07/02/12  Yes Historical Provider, MD  mirtazapine (REMERON) 7.5 MG tablet Take 1 tablet (7.5 mg total) by mouth at bedtime. 10/21/11  Yes Edsel Petrin, DO  oxyCODONE-acetaminophen (PERCOCET) 7.5-325 MG per tablet Take 1 tablet by mouth Three times daily as needed. For pain 02/22/12  Yes Historical Provider,  MD  QUEtiapine (SEROQUEL) 200 MG tablet Take 200 mg by mouth at bedtime.   Yes Historical Provider, MD  tiotropium (SPIRIVA HANDIHALER) 18 MCG inhalation capsule Place 1 capsule (18 mcg total) into inhaler and inhale daily. 10/21/11 10/20/12 Yes Edsel Petrin, DO    Allergies:  Allergies  Allergen Reactions  . Penicillins Rash    Social History:   reports that she has been smoking Cigarettes.  She has a 35 pack-year smoking history. She has  never used smokeless tobacco. She reports that she does not drink alcohol or use illicit drugs.  Family History: Family History  Problem Relation Age of Onset  . Lung cancer Father   . Emphysema Mother   . Neurofibromatosis Mother   . Liver disease      Physical Exam:  GEN:  Pleasant 61 year old elderly well nourished well developed Caucasian Female examined  and in no acute distress; cooperative with exam Filed Vitals:   07/27/12 2245 07/27/12 2358 07/28/12 0032 07/28/12 0100  BP: 126/77 140/79 124/88 130/78  Pulse: 87 86 92 88  Temp:      Resp: 20 22 18    SpO2: 96% 100% 95% 94%   Blood pressure 130/78, pulse 88, temperature 98 F (36.7 C), resp. rate 18, SpO2 94.00%. PSYCH: She is alert and oriented x4; does not appear anxious does not appear depressed; affect is normal HEENT: Normocephalic and Atraumatic, Mucous membranes pink; PERRLA; EOM intact; Fundi:  Benign;  No scleral icterus, Nares: Patent, Oropharynx: Clear, Fair Dentition, Neck:  FROM, no cervical lymphadenopathy nor thyromegaly or carotid bruit; no JVD; Breasts:: Not examined CHEST WALL: No tenderness CHEST: Normal respiration, clear to auscultation bilaterally HEART: Regular rate and rhythm; no murmurs rubs or gallops BACK: No kyphosis or scoliosis; no CVA tenderness ABDOMEN: Positive Bowel Sounds, soft non-tender; no masses, no organomegaly, no pannus; no intertriginous candida. Rectal Exam: Not done EXTREMITIES: No bone or joint deformity; age-appropriate arthropathy of the hands and knees; no cyanosis, clubbing or edema; no ulcerations. Genitalia: not examined PULSES: 2+ and symmetric SKIN: Normal hydration,  Numerous nodular papules over entire body,  no ulcerations.   CNS: Cranial nerves 2-12 grossly intact no focal neurologic deficit    Labs on Admission:  Basic Metabolic Panel:  Lab 07/27/12 1610  NA 136  K 4.6  CL 99  CO2 27  GLUCOSE 89  BUN 19  CREATININE 0.90  CALCIUM 9.1  MG --  PHOS --    Liver Function Tests: No results found for this basename: AST:5,ALT:5,ALKPHOS:5,BILITOT:5,PROT:5,ALBUMIN:5 in the last 168 hours No results found for this basename: LIPASE:5,AMYLASE:5 in the last 168 hours No results found for this basename: AMMONIA:5 in the last 168 hours CBC:  Lab 07/27/12 1918  WBC 6.4  NEUTROABS 4.9  HGB 12.3  HCT 36.9  MCV 93.9  PLT 259   Cardiac Enzymes: No results found for this basename: CKTOTAL:5,CKMB:5,CKMBINDEX:5,TROPONINI:5 in the last 168 hours  BNP (last 3 results)  Basename 10/17/11 0715  PROBNP 233.6*   CBG:  Lab 07/27/12 1923  GLUCAP 97    Radiological Exams on Admission: Dg Chest 2 View  07/27/2012  *RADIOLOGY REPORT*  Clinical Data: Cough, mid anterior chest pain, syncope, COPD, lung cancer, smoking history, CHF, hypertension  CHEST - 2 VIEW  Comparison: 04/21/2012  Findings: Upper normal heart size. Mediastinal contours and pulmonary vascularity normal for the degree of scoliosis. Diffuse interstitial lung disease changes are seen throughout both lungs. No definite acute infiltrate, pleural effusion or pneumothorax.  Question vague nodular density at lateral left upper lobe. Bones appear severely demineralized. Intraspinal stimulator lower thoracic region.  IMPRESSION: Changes of COPD and chronic interstitial lung disease. Question vague nodular density 13 x 18 mm left upper lobe, not definitely seen on the prior exam. Recommend CT chest follow-up to exclude nodule.   Original Report Authenticated By: Ulyses Southward, M.D.    Ct Head Wo Contrast  07/28/2012  *RADIOLOGY REPORT*  Clinical Data: Syncope, headache.  CT HEAD WITHOUT CONTRAST  Technique:  Contiguous axial images were obtained from the base of the skull through the vertex without contrast.  Comparison: 04/21/2012  Findings: Prominence of the sulci, cisterns, and ventricles, in keeping with volume loss. There are subcortical and periventricular white matter hypodensities, a nonspecific  finding most often seen with chronic microangiopathic changes.  There is no evidence for acute hemorrhage, overt hydrocephalus, or abnormal extra-axial fluid collection.  No definite CT evidence for acute cortical based (large artery) infarction.  Right posterior parietal dural-based lesion measures 1.5 cm maximum diameter, favored to represent a meningioma (image 24). Multiple subcutaneous rounded lesions are favored to reflect sebaceous cyst, similar to prior. The visualized paranasal sinuses and mastoid air cells are predominately clear.  IMPRESSION: Mild white matter changes and stable 1.5 cm right posterior parietal dural based lesion, most in keeping with a meningioma. No definite CT evidence for acute intracranial abnormality.   Original Report Authenticated By: Jearld Lesch, M.D.    Ct Chest W Contrast  07/27/2012  *RADIOLOGY REPORT*  Clinical Data: Shortness of breath, lung cancer  CT CHEST WITH CONTRAST  Technique:  Multidetector CT imaging of the chest was performed following the standard protocol during bolus administration of intravenous contrast.  Contrast: 75mL OMNIPAQUE IOHEXOL 300 MG/ML  SOLN 07/27/2029 radiograph  Comparison: 02/13/2012 CT  Findings: Normal caliber aorta with scattered atherosclerotic disease.  Normal heart size.  Coronary artery calcification.  Trace pericardial fluid.  No pleural effusions.  No enlarged intrathoracic lymph nodes.  Peripherally calcified left thyroid lobe lesion measures 13 mm.  Limited images through the upper abdomen show biliary ductal dilatation, with the CBD measuring 12 mm.  Distal duct is not included on the study.  The appearance is similar to prior.  Centrolobular emphysematous changes.  Central airways are patent. Bibasilar atelectasis.  No pneumothorax.  Scattered areas of scarring.  An 8 mm nodular opacity posterior left lower lobe on image 17 of series 3 is mildly increased, with increased adjacent linear opacity, likely atelectasis or  scarring.  Partially imaged neural stimulator wires.  Diffuse osteopenia. Thoracic spinal curvature.  Sequelae of prior rib fractures.  IMPRESSION: Left upper lobe nodular opacity is mildly increased in size, mild increased surrounding linear opacity.  This is nonspecific. Recommend short-term (2-3 month) follow-up.  Emphysema.  Other findings similar to prior as above.   Original Report Authenticated By: Jearld Lesch, M.D.       Assessment: Principal Problem:  *Syncope and collapse Active Problems:  Hypoxemia  HYPERTENSION, BENIGN SYSTEMIC  CHRONIC OBSTRUCTIVE PULMONARY DISEASE  HEADACHE, CHRONIC  Lung cancer  TOBACCO DEPENDENCE  NEUROFIBROMATOSIS  Lung Nodule  Brain Mass (?Meningioma)   Plan:    Admit to Telemetry Bed Syncope workup Monitor O2 saturations, Supplemental O2 PRN Check orthostatic Vitals q shift Neuro Checks Reconcile Home Medications Nicotine patch, Counseled Re: Tobacco Cessation DVT prophylaxis    Code Status:  FULL CODE Family Communication:  N/A Disposition Plan:  Return to Home on Discharge  Time spent: 9 Minutes  Ron Parker [facility] Pager [phone]  If 7PM-7AM, please contact night-coverage [email or web address] Password [facility] 07/28/2012, 2:29 AM

## 2012-07-28 NOTE — Progress Notes (Signed)
[facility] PROGRESS NOTE  [name] WGN:[id number] DOB: [birth year] DOA: 07/27/2012 PCP: Bufford Spikes, DO  Assessment/Plan:  Acute on chronic respiratory failure with increasing desaturation with ambulation.  Ddx includes COPD exacerbation (stage 4 COPD) versus diastolic heart failure.  She has pulmonary fibrosis and is on 5L of home O2 at baseline.   -  ECHO -  Solumedrol 40mg  IV BID -  Levofloxacin -  Restart LABA/ICS (patient not taking but was prescribed at last d/c in 10/2011) -  Continue spiriva -  Continue scheduled albuterol  Syncope:  May have been due to hypoxia during ambulation.  Orthostatics negative.  She is also on several psychoactive medications which may increase her risk for falls and syncope, such as seroquel, ambien, xanax.  She has also taken long acting narcotics for dyspnea in the past.   - continue telemetry -  Improve respiratory status - Will discuss option of decreasing one or more of these medications, however, she has had discussions about hospice care previously and may want to continue these medications for her severe dyspnea.    HTN:  Blood pressure stable to mildly elevated.  Given severe COPD, avoid ACE/ARB and BB -  D/c lisinopril  -  Start HCTZ  Wheezing with pulmonary fibrosis:  Minimize problems that may be worsening, such as GERD -  PPI  Tobacco abuse:  Counseled cessation -  Nicotine patch  DIET:  Healthy haert ACCESS:  PIV IVF:  None PROPH:  lovenox  Code Status: DNR Family Communication: spoke with patient alone at bedside Disposition Plan: Pending improvement in respiratory status and syncope work up complete.     Consultants:  None  Procedures:  ECHO  Antibiotics:  Levofloxacin 12/22 >>  HPI/Subjective:  Patient states that she had some increased dyspnea with ambulation.  She is currently feeling ill with general malaise, but denies increased SOB or chest discomfort while at rest.  Denies fevers, chills,  nausea, vomiting, diarrhea, constipation  Objective: Filed Vitals:   07/28/12 0403 07/28/12 0406 07/28/12 0723 07/28/12 1356  BP: 126/83 137/89    Pulse: 86 94    Temp:      TempSrc:      Resp:      Height:      Weight:      SpO2:   96% 95%    Intake/Output Summary (Last 24 hours) at 07/28/12 1410 Last data filed at 07/28/12 7846  Gross per 24 hour  Intake 1236.25 ml  Output    100 ml  Net 1136.25 ml   Filed Weights   07/28/12 0400  Weight: 60.51 kg (133 lb 6.4 oz)    Exam:   General:  CF, no acute distress, resting in bed, nasal canula in place  HEENT:  MMM  Cardiovascular: RRR, no mrg, 2+ pulses, cool extremities  Respiratory:  Diminished bilateral breath sounds with high pitched faint full expiratory wheeze with prolonged I:E  Abdomen: NABS, soft, nondistended, nontender, no organomegaly  MSK:  NO LEE  Data Reviewed: Basic Metabolic Panel:  Lab 07/28/12 9629 07/27/12 1918  NA 137 136  K 4.2 4.6  CL 101 99  CO2 24 27  GLUCOSE 91 89  BUN 14 19  CREATININE 0.75 0.90  CALCIUM 8.9 9.1  MG -- --  PHOS -- --   Liver Function Tests: No results found for this basename: AST:5,ALT:5,ALKPHOS:5,BILITOT:5,PROT:5,ALBUMIN:5 in the last 168 hours No results found for this basename: LIPASE:5,AMYLASE:5 in the last 168 hours No results found for this  basename: AMMONIA:5 in the last 168 hours CBC:  Lab 07/28/12 0445 07/27/12 1918  WBC 5.3 6.4  NEUTROABS -- 4.9  HGB 11.7* 12.3  HCT 34.7* 36.9  MCV 93.8 93.9  PLT 232 259   Cardiac Enzymes: No results found for this basename: CKTOTAL:5,CKMB:5,CKMBINDEX:5,TROPONINI:5 in the last 168 hours BNP (last 3 results)  Basename 10/17/11 0715  PROBNP 233.6*   CBG:  Lab 07/27/12 1923  GLUCAP 97    Recent Results (from the past 240 hour(s))  MRSA PCR SCREENING     Status: Normal   Collection Time   07/28/12  4:11 AM      Component Value Range Status Comment   MRSA by PCR NEGATIVE  NEGATIVE Final       Studies: Dg Chest 2 View  07/27/2012  *RADIOLOGY REPORT*  Clinical Data: Cough, mid anterior chest pain, syncope, COPD, lung cancer, smoking history, CHF, hypertension  CHEST - 2 VIEW  Comparison: 04/21/2012  Findings: Upper normal heart size. Mediastinal contours and pulmonary vascularity normal for the degree of scoliosis. Diffuse interstitial lung disease changes are seen throughout both lungs. No definite acute infiltrate, pleural effusion or pneumothorax. Question vague nodular density at lateral left upper lobe. Bones appear severely demineralized. Intraspinal stimulator lower thoracic region.  IMPRESSION: Changes of COPD and chronic interstitial lung disease. Question vague nodular density 13 x 18 mm left upper lobe, not definitely seen on the prior exam. Recommend CT chest follow-up to exclude nodule.   Original Report Authenticated By: Ulyses Southward, M.D.    Ct Head Wo Contrast  07/28/2012  *RADIOLOGY REPORT*  Clinical Data: Syncope, headache.  CT HEAD WITHOUT CONTRAST  Technique:  Contiguous axial images were obtained from the base of the skull through the vertex without contrast.  Comparison: 04/21/2012  Findings: Prominence of the sulci, cisterns, and ventricles, in keeping with volume loss. There are subcortical and periventricular white matter hypodensities, a nonspecific finding most often seen with chronic microangiopathic changes.  There is no evidence for acute hemorrhage, overt hydrocephalus, or abnormal extra-axial fluid collection.  No definite CT evidence for acute cortical based (large artery) infarction.  Right posterior parietal dural-based lesion measures 1.5 cm maximum diameter, favored to represent a meningioma (image 24). Multiple subcutaneous rounded lesions are favored to reflect sebaceous cyst, similar to prior. The visualized paranasal sinuses and mastoid air cells are predominately clear.  IMPRESSION: Mild white matter changes and stable 1.5 cm right posterior parietal dural  based lesion, most in keeping with a meningioma. No definite CT evidence for acute intracranial abnormality.   Original Report Authenticated By: Jearld Lesch, M.D.    Ct Chest W Contrast  07/27/2012  *RADIOLOGY REPORT*  Clinical Data: Shortness of breath, lung cancer  CT CHEST WITH CONTRAST  Technique:  Multidetector CT imaging of the chest was performed following the standard protocol during bolus administration of intravenous contrast.  Contrast: 75mL OMNIPAQUE IOHEXOL 300 MG/ML  SOLN 07/27/2029 radiograph  Comparison: 02/13/2012 CT  Findings: Normal caliber aorta with scattered atherosclerotic disease.  Normal heart size.  Coronary artery calcification.  Trace pericardial fluid.  No pleural effusions.  No enlarged intrathoracic lymph nodes.  Peripherally calcified left thyroid lobe lesion measures 13 mm.  Limited images through the upper abdomen show biliary ductal dilatation, with the CBD measuring 12 mm.  Distal duct is not included on the study.  The appearance is similar to prior.  Centrolobular emphysematous changes.  Central airways are patent. Bibasilar atelectasis.  No pneumothorax.  Scattered areas of  scarring.  An 8 mm nodular opacity posterior left lower lobe on image 17 of series 3 is mildly increased, with increased adjacent linear opacity, likely atelectasis or scarring.  Partially imaged neural stimulator wires.  Diffuse osteopenia. Thoracic spinal curvature.  Sequelae of prior rib fractures.  IMPRESSION: Left upper lobe nodular opacity is mildly increased in size, mild increased surrounding linear opacity.  This is nonspecific. Recommend Kelijah Towry-term (2-3 month) follow-up.  Emphysema.  Other findings similar to prior as above.   Original Report Authenticated By: Jearld Lesch, M.D.     Scheduled Meds:   . sodium chloride   Intravenous STAT  . albuterol  2.5 mg Nebulization Q6H  . antiseptic oral rinse  15 mL Mouth Rinse BID  . atorvastatin  10 mg Oral Daily  .  budesonide-formoterol  2 puff Inhalation BID  . enoxaparin (LOVENOX) injection  40 mg Subcutaneous Q24H  . lisinopril  10 mg Oral QHS  . methylPREDNISolone (SOLU-MEDROL) injection  40 mg Intravenous Q12H  . mirtazapine  7.5 mg Oral QHS  . nicotine  14 mg Transdermal To ER  . QUEtiapine  200 mg Oral QHS  . sodium chloride  3 mL Intravenous Q12H  . tiotropium  18 mcg Inhalation Daily  . zolpidem  5 mg Oral QHS   Continuous Infusions:   . sodium chloride 75 mL/hr at 07/28/12 0455    Principal Problem:  *Syncope and collapse Active Problems:  NEUROFIBROMATOSIS  TOBACCO DEPENDENCE  HYPERTENSION, BENIGN SYSTEMIC  CHRONIC OBSTRUCTIVE PULMONARY DISEASE  HEADACHE, CHRONIC  Lung cancer  Hypoxemia    Time spent: 30    Melynda Krzywicki, [facility]  [facility] Pager [phone]. If 8PM-8AM, please contact night-coverage at [email or web address], password [facility] 07/28/2012, 2:10 PM  LOS: 1 day

## 2012-07-28 NOTE — ED Notes (Signed)
Dr. Lovell Sheehan stated there was not a need to perform [facility] screen on admission to unit, and that it is unlikely the syncopal episode is related to CVA or TIA

## 2012-07-28 NOTE — ED Provider Notes (Signed)
Medical screening examination/treatment/procedure(s) were conducted as a shared visit with non-physician practitioner(s) and myself.  I personally evaluated the patient during the encounter Elderly woman fainted and fell on face today.  Lab workup showed a left upper lobe opacity, which was further evaluated with CT, with inconclusive findings there.  She had significant desaturation when walking in the department.  Will need observation i the hospital for her syncope, which may be related to poor oxygenation.  Carleene Cooper III, MD 07/28/12 [phone]

## 2012-07-29 DIAGNOSIS — J441 Chronic obstructive pulmonary disease with (acute) exacerbation: Secondary | ICD-10-CM

## 2012-07-29 DIAGNOSIS — E875 Hyperkalemia: Secondary | ICD-10-CM

## 2012-07-29 DIAGNOSIS — J9620 Acute and chronic respiratory failure, unspecified whether with hypoxia or hypercapnia: Principal | ICD-10-CM

## 2012-07-29 LAB — BASIC METABOLIC PANEL
Calcium: 8.8 mg/dL (ref 8.4–10.5)
Calcium: 9.5 mg/dL (ref 8.4–10.5)
Creatinine, Ser: 0.83 mg/dL (ref 0.50–1.10)
GFR calc Af Amer: 85 mL/min — ABNORMAL LOW (ref >90)
GFR calc Af Amer: 84 mL/min — ABNORMAL LOW (ref >90)
GFR calc non Af Amer: 73 mL/min — ABNORMAL LOW (ref >90)
GFR calc non Af Amer: 72 mL/min — ABNORMAL LOW (ref >90)
Potassium: 4.6 mEq/L (ref 3.5–5.1)
Sodium: 133 mEq/L — ABNORMAL LOW (ref 135–145)
Sodium: 137 mEq/L (ref 135–145)

## 2012-07-29 LAB — CBC
MCH: 31.9 pg (ref 26.0–34.0)
MCHC: 34 g/dL (ref 30.0–36.0)
MCV: 93.9 fL (ref 78.0–100.0)
Platelets: 239 10*3/uL (ref 150–400)
RBC: 3.76 MIL/uL — ABNORMAL LOW (ref 3.87–5.11)
RDW: 13.7 % (ref 11.5–15.5)

## 2012-07-29 MED ORDER — SODIUM POLYSTYRENE SULFONATE 15 GM/60ML PO SUSP
45.0000 g | Freq: Once | ORAL | Status: AC
  Administered 2012-07-29: 45 g via RECTAL
  Filled 2012-07-29: qty 180

## 2012-07-29 MED ORDER — SODIUM POLYSTYRENE SULFONATE 15 GM/60ML PO SUSP
30.0000 g | Freq: Once | ORAL | Status: DC
  Filled 2012-07-29: qty 120

## 2012-07-29 NOTE — Progress Notes (Signed)
[facility] PROGRESS NOTE  [name]:[id number] DOB: [birth year] DOA: 07/27/2012 PCP: Bufford Spikes, DO  Assessment/Plan:  Acute on chronic respiratory failure with increasing desaturation with ambulation.  Ddx includes COPD exacerbation (stage 4 COPD) versus diastolic heart failure.  She has pulmonary fibrosis and is on 5L of home O2 at baseline, currently on 6L [place] here -  ECHO pending -  Solumedrol 40mg  IV BID -  Levofloxacin -  Continue LABA/ICS (patient states she has been taking advair, but it was not on her admission MAR) -  Continue spiriva -  Continue scheduled albuterol  Syncope:  May have been due to hypoxia during ambulation.  Orthostatics negative.  She is also on several psychoactive medications which may increase her risk for falls and syncope, such as seroquel, ambien, xanax.  She has also taken long acting narcotics for dyspnea in the past.   - continue telemetry -  Improve respiratory status -  Patient states she has not taken morphine for dyspnea recently.  May increase her chance of syncope   HTN:  Blood pressure stable to mildly elevated.  Given severe COPD, avoid ACE/ARB and BB -  D/c lisinopril  -  Start HCTZ  Wheezing with pulmonary fibrosis:  Minimize problems that may be worsening, such as GERD -  PPI  Hyperkalemia:  Acute and unclear etiology.  Patient does not have significant renal disease and I stopped her ACEI yesterday.  eCG without peaked T-waves -  Continue to avoid ARB -  Kayexalate -  Repeat BMP  Tobacco abuse:  Counseled cessation -  Nicotine patch  DIET:  Healthy haert ACCESS:  PIV IVF:  None PROPH:  lovenox  Code Status: DNR Family Communication: spoke with patient alone at bedside Disposition Plan: Pending improvement in respiratory status and syncope work up complete.     Consultants:  None  Procedures:  ECHO pending  Antibiotics:  Levofloxacin 12/22 >>  HPI/Subjective:  Patient states that her breathing  is a little better and she is feeling better.  She still feels very Arvie Villarruel of breath with ambulation.  Denies chest discomfort while at rest.  Denies fevers, chills, nausea, vomiting, diarrhea, constipation  Objective: Filed Vitals:   07/29/12 0832 07/29/12 1009 07/29/12 1155 07/29/12 1310  BP:  123/78 131/81   Pulse:   86   Temp:   98.2 F (36.8 C)   TempSrc:   Oral   Resp:   18   Height:      Weight:      SpO2: 98%  96% 95%    Intake/Output Summary (Last 24 hours) at 07/29/12 1407 Last data filed at 07/29/12 0900  Gross per 24 hour  Intake      0 ml  Output    700 ml  Net   -700 ml   Filed Weights   07/28/12 0400 07/29/12 0500  Weight: 60.51 kg (133 lb 6.4 oz) 60.51 kg (133 lb 6.4 oz)    Exam:   General:  CF, no acute distress, resting in bed, nasal canula in place  HEENT:  MMM  Cardiovascular: RRR, no mrg, 2+ pulses, cool extremities  Respiratory:  Diminished bilateral breath sounds with high pitched faint full expiratory wheeze with prolonged I:E  Abdomen: NABS, soft, nondistended, nontender, no organomegaly  MSK:  NO LEE  Data Reviewed: Basic Metabolic Panel:  Lab 07/29/12 9562 07/28/12 0445 07/27/12 1918  NA 133* 137 136  K 5.9* 4.2 4.6  CL 103 101 99  CO2 21  24 27  GLUCOSE 120* 91 89  BUN 18 14 19   CREATININE 0.83 0.75 0.90  CALCIUM 8.8 8.9 9.1  MG -- -- --  PHOS -- -- --   Liver Function Tests: No results found for this basename: AST:5,ALT:5,ALKPHOS:5,BILITOT:5,PROT:5,ALBUMIN:5 in the last 168 hours No results found for this basename: LIPASE:5,AMYLASE:5 in the last 168 hours No results found for this basename: AMMONIA:5 in the last 168 hours CBC:  Lab 07/29/12 0515 07/28/12 0445 07/27/12 1918  WBC 8.5 5.3 6.4  NEUTROABS -- -- 4.9  HGB 12.0 11.7* 12.3  HCT 35.3* 34.7* 36.9  MCV 93.9 93.8 93.9  PLT 239 232 259   Cardiac Enzymes: No results found for this basename: CKTOTAL:5,CKMB:5,CKMBINDEX:5,TROPONINI:5 in the last 168 hours BNP (last 3  results)  Basename 10/17/11 0715  PROBNP 233.6*   CBG:  Lab 07/27/12 1923  GLUCAP 97    Recent Results (from the past 240 hour(s))  MRSA PCR SCREENING     Status: Normal   Collection Time   07/28/12  4:11 AM      Component Value Range Status Comment   MRSA by PCR NEGATIVE  NEGATIVE Final      Studies: Dg Chest 2 View  07/27/2012  *RADIOLOGY REPORT*  Clinical Data: Cough, mid anterior chest pain, syncope, COPD, lung cancer, smoking history, CHF, hypertension  CHEST - 2 VIEW  Comparison: 04/21/2012  Findings: Upper normal heart size. Mediastinal contours and pulmonary vascularity normal for the degree of scoliosis. Diffuse interstitial lung disease changes are seen throughout both lungs. No definite acute infiltrate, pleural effusion or pneumothorax. Question vague nodular density at lateral left upper lobe. Bones appear severely demineralized. Intraspinal stimulator lower thoracic region.  IMPRESSION: Changes of COPD and chronic interstitial lung disease. Question vague nodular density 13 x 18 mm left upper lobe, not definitely seen on the prior exam. Recommend CT chest follow-up to exclude nodule.   Original Report Authenticated By: Ulyses Southward, M.D.    Ct Head Wo Contrast  07/28/2012  *RADIOLOGY REPORT*  Clinical Data: Syncope, headache.  CT HEAD WITHOUT CONTRAST  Technique:  Contiguous axial images were obtained from the base of the skull through the vertex without contrast.  Comparison: 04/21/2012  Findings: Prominence of the sulci, cisterns, and ventricles, in keeping with volume loss. There are subcortical and periventricular white matter hypodensities, a nonspecific finding most often seen with chronic microangiopathic changes.  There is no evidence for acute hemorrhage, overt hydrocephalus, or abnormal extra-axial fluid collection.  No definite CT evidence for acute cortical based (large artery) infarction.  Right posterior parietal dural-based lesion measures 1.5 cm maximum diameter,  favored to represent a meningioma (image 24). Multiple subcutaneous rounded lesions are favored to reflect sebaceous cyst, similar to prior. The visualized paranasal sinuses and mastoid air cells are predominately clear.  IMPRESSION: Mild white matter changes and stable 1.5 cm right posterior parietal dural based lesion, most in keeping with a meningioma. No definite CT evidence for acute intracranial abnormality.   Original Report Authenticated By: Jearld Lesch, M.D.    Ct Chest W Contrast  07/27/2012  *RADIOLOGY REPORT*  Clinical Data: Shortness of breath, lung cancer  CT CHEST WITH CONTRAST  Technique:  Multidetector CT imaging of the chest was performed following the standard protocol during bolus administration of intravenous contrast.  Contrast: 75mL OMNIPAQUE IOHEXOL 300 MG/ML  SOLN 07/27/2029 radiograph  Comparison: 02/13/2012 CT  Findings: Normal caliber aorta with scattered atherosclerotic disease.  Normal heart size.  Coronary artery calcification.  Trace pericardial fluid.  No pleural effusions.  No enlarged intrathoracic lymph nodes.  Peripherally calcified left thyroid lobe lesion measures 13 mm.  Limited images through the upper abdomen show biliary ductal dilatation, with the CBD measuring 12 mm.  Distal duct is not included on the study.  The appearance is similar to prior.  Centrolobular emphysematous changes.  Central airways are patent. Bibasilar atelectasis.  No pneumothorax.  Scattered areas of scarring.  An 8 mm nodular opacity posterior left lower lobe on image 17 of series 3 is mildly increased, with increased adjacent linear opacity, likely atelectasis or scarring.  Partially imaged neural stimulator wires.  Diffuse osteopenia. Thoracic spinal curvature.  Sequelae of prior rib fractures.  IMPRESSION: Left upper lobe nodular opacity is mildly increased in size, mild increased surrounding linear opacity.  This is nonspecific. Recommend Dara Beidleman-term (2-3 month) follow-up.  Emphysema.   Other findings similar to prior as above.   Original Report Authenticated By: Jearld Lesch, M.D.     Scheduled Meds:    . albuterol  2.5 mg Nebulization Q6H  . antiseptic oral rinse  15 mL Mouth Rinse BID  . atorvastatin  10 mg Oral Daily  . budesonide-formoterol  2 puff Inhalation BID  . cefTRIAXone (ROCEPHIN)  IV  1 g Intravenous Q24H  . enoxaparin (LOVENOX) injection  40 mg Subcutaneous Q24H  . hydrochlorothiazide  25 mg Oral Daily  . methylPREDNISolone (SOLU-MEDROL) injection  40 mg Intravenous Q12H  . mirtazapine  7.5 mg Oral QHS  . pantoprazole  40 mg Oral BID AC  . QUEtiapine  200 mg Oral QHS  . sodium chloride  3 mL Intravenous Q12H  . sodium polystyrene  30 g Oral Once  . tiotropium  18 mcg Inhalation Daily  . zolpidem  5 mg Oral QHS   Continuous Infusions:   Principal Problem:  *Syncope and collapse Active Problems:  NEUROFIBROMATOSIS  TOBACCO DEPENDENCE  HYPERTENSION, BENIGN SYSTEMIC  CHRONIC OBSTRUCTIVE PULMONARY DISEASE  HEADACHE, CHRONIC  Lung cancer  Hypoxemia  Acute-on-chronic respiratory failure    Time spent: 30    Shareef Eddinger, [facility]  [facility] Pager [phone]. If 8PM-8AM, please contact night-coverage at [email or web address], password [facility] 07/29/2012, 2:07 PM  LOS: 2 days

## 2012-07-29 NOTE — Progress Notes (Signed)
Echocardiogram 2D Echocardiogram has been performed.  [name] 07/29/2012, 3:59 PM

## 2012-07-29 NOTE — Evaluation (Signed)
Physical Therapy Evaluation Patient Details Name: [name] MRN: [id number] DOB: June 03, 1951 Today's Date: 07/29/2012 Time: 4098-1191 PT Time Calculation (min): 21 min  PT Assessment / Plan / Recommendation Clinical Impression  61 y.o. female admitted to [facility] for syncopal episode and SOB/COPD exacerbation.  She presents today with decreased overall strength and activity tolerance.  She states that her O2 is normally 5 L and that she normally only drops into the 80s with walking (today was into the mid 70s on 6L O2).  She is generally weak and deconditioned.  She has necessary level of help and plenty of walking aids at home. I encouraged her to start using her rollator with a seat on it for walking around the house.  She declined HHPT f/u.      PT Assessment  Patient needs continued PT services    Follow Up Recommendations  Other (comment) (pt is politely declining HHPT f/u at discharge.  )    Does the patient have the potential to tolerate intense rehabilitation    NA  Barriers to Discharge None      Equipment Recommendations  None recommended by PT    Recommendations for Other Services   none  Frequency Min 3X/week    Precautions / Restrictions Precautions Precautions: Fall Precaution Comments: monitor O2 with gait.     Pertinent Vitals/Pain Reports chronic 7/10 low back pain (has been premedicated).  O2 sats on 6L O2 [place] at rest 95%, with walking 74%, returned quickly to 93% with seated rest break < 1 min.       Mobility  Bed Mobility Bed Mobility: Supine to Sit;Sitting - Scoot to Edge of Bed Supine to Sit: 6: Modified independent (Device/Increase time);With rails;HOB elevated Sitting - Scoot to Edge of Bed: With rail Details for Bed Mobility Assistance: relies on rails to get to sitting, but has hospital bed with rails at home Transfers Transfers: Sit to Stand;Stand to Sit Sit to Stand: 5: Supervision;With upper extremity assist;From bed Stand to Sit: 5:  Supervision;With upper extremity assist;To bed Details for Transfer Assistance: supervision for safety due to h/o falls and/or passing out.   Ambulation/Gait Ambulation/Gait Assistance: 4: Min guard Ambulation Distance (Feet): 80 Feet Assistive device: Rolling walker Ambulation/Gait Assistance Details: increased DOE with gait and increased muscles fatigue as visualized by shaking arms and legs.  Pt reports she thinks she feel due to blood clot in her left leg, but when she talks about the situation it sounds like she did lose consciousness.   Gait Pattern: Step-through pattern;Shuffle           PT Diagnosis: Difficulty walking;Abnormality of gait;Generalized weakness  PT Problem List: Decreased strength;Decreased activity tolerance;Decreased balance;Decreased mobility PT Treatment Interventions: DME instruction;Gait training;Functional mobility training;Therapeutic exercise;Therapeutic activities;Balance training;Neuromuscular re-education;Patient/family education   PT Goals Acute Rehab PT Goals PT Goal Formulation: With patient Time For Goal Achievement: 08/12/12 Potential to Achieve Goals: Good Pt will go Sit to Stand: with modified independence;with upper extremity assist PT Goal: Sit to Stand - Progress: Goal set today Pt will go Stand to Sit: with modified independence;with upper extremity assist PT Goal: Stand to Sit - Progress: Goal set today Pt will Transfer Bed to Chair/Chair to Bed: with modified independence PT Transfer Goal: Bed to Chair/Chair to Bed - Progress: Goal set today Pt will Ambulate: 51 - 150 feet;with modified independence;with least restrictive assistive device PT Goal: Ambulate - Progress: Goal set today  Visit Information  Last PT Received On: 07/29/12 Assistance Needed: +1  Subjective Data  Subjective: Pt reports that she still smokes, but she turns off her oxygen while she is smoking.   Patient Stated Goal: to go home ASAP   Prior Functioning   Home Living Lives With: Spouse;Family (granddaughter and 2 great grands) Available Help at Discharge: Available 24 hours/day Type of Home: House Home Access: Ramped entrance Home Layout: One level Bathroom Shower/Tub: Tub/shower unit;C[employer]ighter: Standard Home Adaptive Equipment: Wheelchair - powered;Hospital bed;Other (comment);Shower chair without back;Walker - four wheeled;Quad cane (5 L home O2) Additional Comments: doesn't get out of bed much except to go to the bathroom.  She has had [facility] for therapy, but is not current.   Prior Function Level of Independence: Independent Driving: No Communication Communication: No difficulties    Cognition  Overall Cognitive Status: Appears within functional limits for tasks assessed/performed Arousal/Alertness: Awake/alert    Extremity/Trunk Assessment Right Lower Extremity Assessment RLE ROM/Strength/Tone: Deficits RLE ROM/Strength/Tone Deficits: grossly 4/5 RLE Sensation: WFL - Light Touch Left Lower Extremity Assessment LLE ROM/Strength/Tone: Deficits LLE ROM/Strength/Tone Deficits: grossly 4/5 LLE Sensation: Deficits LLE Sensation Deficits: pt reports due to blood clot in left leg.        End of Session PT - End of Session Equipment Utilized During Treatment: Oxygen Activity Tolerance: Patient limited by fatigue (limited by DOE) Patient left: in bed;with call bell/phone within reach (seated EOB)    Lurena Joiner B. Donevan Biller, PT, DPT [phone]   07/29/2012, 3:53 PM

## 2012-07-30 DIAGNOSIS — F411 Generalized anxiety disorder: Secondary | ICD-10-CM

## 2012-07-30 DIAGNOSIS — E875 Hyperkalemia: Secondary | ICD-10-CM

## 2012-07-30 LAB — CBC
Hemoglobin: 12.1 g/dL (ref 12.0–15.0)
MCH: 31.8 pg (ref 26.0–34.0)
MCHC: 33.8 g/dL (ref 30.0–36.0)
Platelets: 243 10*3/uL (ref 150–400)
RBC: 3.81 MIL/uL — ABNORMAL LOW (ref 3.87–5.11)

## 2012-07-30 LAB — BASIC METABOLIC PANEL
CO2: 24 mEq/L (ref 19–32)
Calcium: 8.9 mg/dL (ref 8.4–10.5)
GFR calc non Af Amer: 66 mL/min — ABNORMAL LOW (ref >90)
Glucose, Bld: 123 mg/dL — ABNORMAL HIGH (ref 70–99)
Potassium: 4.1 mEq/L (ref 3.5–5.1)
Sodium: 136 mEq/L (ref 135–145)

## 2012-07-30 MED ORDER — OMEPRAZOLE 40 MG PO CPDR: 40.0000 mg | DELAYED_RELEASE_CAPSULE | Freq: Two times a day (BID) | ORAL | Status: DC

## 2012-07-30 MED ORDER — BUDESONIDE-FORMOTEROL FUMARATE 160-4.5 MCG/ACT IN AERO: 2.0000 | INHALATION_SPRAY | Freq: Two times a day (BID) | RESPIRATORY_TRACT | Status: DC

## 2012-07-30 MED ORDER — OXYCODONE-ACETAMINOPHEN 7.5-325 MG PO TABS: 1.0000 | ORAL_TABLET | Freq: Four times a day (QID) | ORAL | Status: DC | PRN

## 2012-07-30 MED ORDER — FLUTICASONE PROPIONATE 50 MCG/ACT NA SUSP
2.0000 | Freq: Every day | NASAL | Status: DC
  Administered 2012-07-30: 2 via NASAL
  Filled 2012-07-30: qty 16

## 2012-07-30 MED ORDER — FLUTICASONE PROPIONATE 50 MCG/ACT NA SUSP: 2.0000 | Freq: Every day | NASAL | Status: DC

## 2012-07-30 MED ORDER — HYDROCHLOROTHIAZIDE 25 MG PO TABS: 25.0000 mg | ORAL_TABLET | Freq: Every day | ORAL | Status: DC

## 2012-07-30 MED ORDER — LEVOFLOXACIN 750 MG PO TABS: 750.0000 mg | ORAL_TABLET | Freq: Every day | ORAL | Status: DC

## 2012-07-30 MED ORDER — PREDNISONE 10 MG PO TABS: ORAL_TABLET | ORAL | Status: DC

## 2012-07-30 MED ORDER — ALBUTEROL SULFATE HFA 108 (90 BASE) MCG/ACT IN AERS: 2.0000 | INHALATION_SPRAY | RESPIRATORY_TRACT | Status: DC | PRN

## 2012-07-30 MED ORDER — TIOTROPIUM BROMIDE MONOHYDRATE 18 MCG IN CAPS: 18.0000 ug | ORAL_CAPSULE | Freq: Every day | RESPIRATORY_TRACT | Status: DC

## 2012-07-30 NOTE — Research (Signed)
D/c orders received;IV removed with gauze on; pt remains in stable condition, pt meds and instructions reviewed and given to pt; pt going home on 6L-[place] home o2; pt d/c to home

## 2012-07-30 NOTE — Discharge Summary (Addendum)
Physician Discharge Summary  [name] ZOX:[id number] DOB: Dec 17, 1950 DOA: 07/27/2012  PCP: [name] Spikes, DO  Admit date: 07/27/2012 Discharge date: 07/30/2012  Recommendations for Outpatient Follow-up:  1. Follow up with pulmonology at already scheduled appointment in January. 2. PCP in 2 weeks for BMP and blood pressure check  Discharge Diagnoses:  Principal Problem:  *Syncope and collapse Active Problems:  NEUROFIBROMATOSIS  TOBACCO DEPENDENCE  HYPERTENSION, BENIGN SYSTEMIC  CHRONIC OBSTRUCTIVE PULMONARY DISEASE  HEADACHE, CHRONIC  Lung cancer  Hypoxemia  Acute-on-chronic respiratory failure  Hyperkalemia   Discharge Condition: stable  Diet recommendation:  Healthy heart  Wt Readings from Last 3 Encounters:  07/30/12 61.689 kg (136 lb)  03/07/12 58.605 kg (129 lb 3.2 oz)  11/06/11 60.328 kg (133 lb)    History of present illness:   [name] is a 61 y.o. female who presents to the ED after 2 episodes of passing out. The first episode occurred when she got up to walk to her bathroom. She reported feeling dizzy and lightheaded and then passed out. She had a second episode. She reports having a severe headache, and she denies having any chest pain or SOB. She reports having similar episodes in the past. In the ED, she was noted to have decreased oxygen saturations when she was ambulating, and she was subsequently referred for admission.   Hospital Course:   Acute on chronic respiratory failure with increasing desaturation with ambulation. Ddx included COPD exacerbation (stage 4 COPD) versus diastolic heart failure, however, her ECHO demonstrated normal systolic and diastolic function and she did not have evidence of hypervolemia on exam.  She is on 5L of home O2 at baseline and was on 6L [place] here and had desaturations to the mid 70s with ambulation, likely contributing to her syncope.  She had symptoms of lightheadedness with these desaturations.  She was started  on solumedrol and levofloxacin.  She continued her LABA/ICS and spiriva.  She was given scheduled albuterol and started on flonase and BID PPI.   She has severe COPD and has life expectancy of 3- 6 months.  Discussed case with pulmonology, Dr. Molli [name], as patient desires to go home to spend Christmas.  Counseled that she is at high risk of hypoxia, syncope, and death, but she is DNR/DNI and tending towards more comfort measures and would like to go home.  She declined home health PT at this time.  Will discharge patient to home with instructions to continue Abx, long prednisone taper.     Syncope: May have been due to hypoxia during ambulation. Orthostatics negative. She is also on several psychoactive medications which may increase her risk for falls and syncope, such as seroquel, ambien, xanax.  Her mentation was normal and her orthostatics were negative.  She did not have arrhythmias on telemetry.  Most likely her syncope was due to hypoxia.    HTN: Blood pressure stable to mildly elevated. Given severe COPD, her ACE was discontinued and she was started on HCTZ.    Wheezing with pulmonary fibrosis: Minimize problems that may be worsening, such as GERD.  She was started on PPI BID.    Hyperkalemia: Acute and unclear etiology although patient is on ACEI.  She does not have significant renal disease.  Her ECG was without peaked T-waves.  She was given kayexalate and her ACEI was discontinued.  Her potassium trended down.    Tobacco abuse: Counseled cessation.  Procedures:  ECHO  Consultations:  Spoke with pulmonology on the phone.  Discharge Exam: Filed Vitals:   07/30/12 0800  BP: 115/68  Pulse: 92  Temp:   Resp: 18   Filed Vitals:   07/29/12 2034 07/30/12 0000 07/30/12 0400 07/30/12 0800  BP:  92/57 107/66 115/68  Pulse: 88 92 94 92  Temp:  98.2 F (36.8 C)    TempSrc:  Oral    Resp: 20 18  18   Height:      Weight:   61.689 kg (136 lb)   SpO2: 96% 97% 98% 94%   Patient  states that she continues to feel [name] of breath with exertion.  She states she has baseline chest discomfort.  Denies nausea, vomiting, diarrhea, constipation, changes in urinary habits.    General: CF, no acute distress, resting in bed, nasal canula in place  HEENT: MMM  Cardiovascular: RRR, no mrg, 2+ pulses, cool extremities  Respiratory: Diminished bilateral breath sounds, no wheeze  Abdomen: NABS, soft, nondistended, nontender, no organomegaly  MSK: NO LEE  Discharge Instructions      Discharge Orders    Future Appointments: Provider: Department: Dept Phone: Center:   08/19/2012 1:45 PM [name] Budge, MD [facility] [phone] None   09/19/2012 1:20 PM [name] Hare, MD [facility] [phone] None     Future Orders Please Complete By Expires   Diet - low sodium heart healthy      Increase activity slowly      Discharge instructions      Comments:   You were hospitalized with a fainting spell probably because your oxygen level was low.  You were started on steroids, antibiotics and breathing treatments and your oxygen level was increased to 6L.  Please do not exert yourself.  Please continue a long steroid taper:  Prednisone 10mg  tabs, take 6 tabs daily for 5 days, then take 5 tabs daily for 5 days, take 4 tabs daily for 5 days, take 3 tabs daily for 5 days, take 2 tabs daily for 5 days, take 1 tabs daily for 5 days, then stop.  Take your first dose today when you get home and your next dose tomorrow morning.  Please take levofloxacin, take your next dose tomorrow morning and complete 5 more days of therapy.  Resume your clarithromycin after you complete your levofloxacin.  Please continue advair.  If you do not have advair, you have been given a prescription for symbicort.  Please stop lisinopril because this can contribute to wheezing and start HCTZ for your blood pressure.  You had high potassium and this can be a side effect of  lisinopril (another reason to stop this medication).  Please start omeprazole in case acid reflux may be contributing to some of your breathing problems and start flonase for the same reason.  STOP smoking!   Call MD for:  temperature >100.4      Call MD for:  persistant nausea and vomiting      Call MD for:  severe uncontrolled pain      Call MD for:  difficulty breathing, headache or visual disturbances      Call MD for:  hives      Call MD for:  persistant dizziness or light-headedness      Call MD for:  extreme fatigue          Medication List     As of 07/30/2012  8:56 AM    STOP taking these medications         clarithromycin 500 MG  tablet   Commonly known as: BIAXIN      lisinopril 5 MG tablet   Commonly known as: PRINIVIL,ZESTRIL      TAKE these medications         albuterol 108 (90 BASE) MCG/ACT inhaler   Commonly known as: PROVENTIL HFA;VENTOLIN HFA   Inhale 2 puffs into the lungs every 4 (four) hours as needed for wheezing or shortness of breath. For shortness of breath      ALPRAZolam 0.5 MG tablet   Commonly known as: XANAX   Take 0.5 mg by mouth 4 (four) times daily as needed. For anxiety      atorvastatin 10 MG tablet   Commonly known as: LIPITOR   Take 10 mg by mouth daily.      budesonide-formoterol 160-4.5 MCG/ACT inhaler   Commonly known as: SYMBICORT   Inhale 2 puffs into the lungs 2 (two) times daily.      EXCEDRIN PO   Take 1 tablet by mouth every 6 (six) hours as needed. For headache      fluticasone 50 MCG/ACT nasal spray   Commonly known as: FLONASE   Place 2 sprays into the nose daily.      hydrochlorothiazide 25 MG tablet   Commonly known as: HYDRODIURIL   Take 1 tablet (25 mg total) by mouth daily.      levofloxacin 750 MG tablet   Commonly known as: LEVAQUIN   Take 1 tablet (750 mg total) by mouth daily.      Lunesta 1 MG Tabs   Generic drug: eszopiclone   Take 1 mg by mouth at bedtime.      mirtazapine 7.5 MG tablet    Commonly known as: REMERON   Take 1 tablet (7.5 mg total) by mouth at bedtime.      omeprazole 40 MG capsule   Commonly known as: PRILOSEC   Take 1 capsule (40 mg total) by mouth 2 (two) times daily.      oxyCODONE-acetaminophen 7.5-325 MG per tablet   Commonly known as: PERCOCET   Take 1 tablet by mouth every 6 (six) hours as needed for pain. For pain      predniSONE 10 MG tablet   Commonly known as: DELTASONE   Take 6 tabs daily x 5 days, then 5 tabs daily x 5 days, 4 tabs daily x 5 days, 3 tabs daily x 5 days, 2 tabs daily x 5 days, 1 tab daily x 5 days, then stop.      QUEtiapine 200 MG tablet   Commonly known as: SEROQUEL   Take 200 mg by mouth at bedtime.      tiotropium 18 MCG inhalation capsule   Commonly known as: SPIRIVA   Place 1 capsule (18 mcg total) into inhaler and inhale daily.         Follow-up Information    Follow up with [name] Budge, MD. On 08/09/2012. (1:45 PM)    Contact information:   [address] [facility] [place] [address] [phone]       Follow up with REED, TIFFANY, DO. Schedule an appointment as soon as possible for a visit in 2 weeks. (blood pressure check and BMP)    Contact information:   [address] [place] [address] [phone]           The results of significant diagnostics from this hospitalization (including imaging, microbiology, ancillary and laboratory) are listed below for reference.    Significant Diagnostic  Studies: Dg Chest 2 View  07/27/2012  *RADIOLOGY REPORT*  Clinical Data: Cough, mid anterior chest pain, syncope, COPD, lung cancer, smoking history, CHF, hypertension  CHEST - 2 VIEW  Comparison: 04/21/2012  Findings: Upper normal heart size. Mediastinal contours and pulmonary vascularity normal for the degree of scoliosis. Diffuse interstitial lung disease changes are seen throughout both lungs. No definite acute infiltrate, pleural effusion or pneumothorax. Question vague  nodular density at lateral left upper lobe. Bones appear severely demineralized. Intraspinal stimulator lower thoracic region.  IMPRESSION: Changes of COPD and chronic interstitial lung disease. Question vague nodular density 13 x 18 mm left upper lobe, not definitely seen on the prior exam. Recommend CT chest follow-up to exclude nodule.   Original Report Authenticated By: Ulyses Southward, M.D.    Ct Head Wo Contrast  07/28/2012  *RADIOLOGY REPORT*  Clinical Data: Syncope, headache.  CT HEAD WITHOUT CONTRAST  Technique:  Contiguous axial images were obtained from the base of the skull through the vertex without contrast.  Comparison: 04/21/2012  Findings: Prominence of the sulci, cisterns, and ventricles, in keeping with volume loss. There are subcortical and periventricular white matter hypodensities, a nonspecific finding most often seen with chronic microangiopathic changes.  There is no evidence for acute hemorrhage, overt hydrocephalus, or abnormal extra-axial fluid collection.  No definite CT evidence for acute cortical based (large artery) infarction.  Right posterior parietal dural-based lesion measures 1.5 cm maximum diameter, favored to represent a meningioma (image 24). Multiple subcutaneous rounded lesions are favored to reflect sebaceous cyst, similar to prior. The visualized paranasal sinuses and mastoid air cells are predominately clear.  IMPRESSION: Mild white matter changes and stable 1.5 cm right posterior parietal dural based lesion, most in keeping with a meningioma. No definite CT evidence for acute intracranial abnormality.   Original Report Authenticated By: Jearld Lesch, M.D.    Ct Chest W Contrast  07/27/2012  *RADIOLOGY REPORT*  Clinical Data: Shortness of breath, lung cancer  CT CHEST WITH CONTRAST  Technique:  Multidetector CT imaging of the chest was performed following the standard protocol during bolus administration of intravenous contrast.  Contrast: 75mL OMNIPAQUE IOHEXOL  300 MG/ML  SOLN 07/27/2029 radiograph  Comparison: 02/13/2012 CT  Findings: Normal caliber aorta with scattered atherosclerotic disease.  Normal heart size.  Coronary artery calcification.  Trace pericardial fluid.  No pleural effusions.  No enlarged intrathoracic lymph nodes.  Peripherally calcified left thyroid lobe lesion measures 13 mm.  Limited images through the upper abdomen show biliary ductal dilatation, with the CBD measuring 12 mm.  Distal duct is not included on the study.  The appearance is similar to prior.  Centrolobular emphysematous changes.  Central airways are patent. Bibasilar atelectasis.  No pneumothorax.  Scattered areas of scarring.  An 8 mm nodular opacity posterior left lower lobe on image 17 of series 3 is mildly increased, with increased adjacent linear opacity, likely atelectasis or scarring.  Partially imaged neural stimulator wires.  Diffuse osteopenia. Thoracic spinal curvature.  Sequelae of prior rib fractures.  IMPRESSION: Left upper lobe nodular opacity is mildly increased in size, mild increased surrounding linear opacity.  This is nonspecific. Recommend Gayla Benn-term (2-3 month) follow-up.  Emphysema.  Other findings similar to prior as above.   Original Report Authenticated By: Jearld Lesch, M.D.     Microbiology: Recent Results (from the past 240 hour(s))  MRSA PCR SCREENING     Status: Normal   Collection Time   07/28/12  4:11 AM  Component Value Range Status Comment   MRSA by PCR NEGATIVE  NEGATIVE Final      Labs: Basic Metabolic Panel:  Lab 07/30/12 8657 07/29/12 1530 07/29/12 0515 07/28/12 0445 07/27/12 1918  NA 136 137 133* 137 136  K 4.1 4.6 5.9* 4.2 4.6  CL 99 102 103 101 99  CO2 24 23 21 24 27   GLUCOSE 123* 104* 120* 91 89  BUN 19 15 18 14 19   CREATININE 0.91 0.84 0.83 0.75 0.90  CALCIUM 8.9 9.5 8.8 8.9 9.1  MG -- -- -- -- --  PHOS -- -- -- -- --   Liver Function Tests: No results found for this basename:  AST:5,ALT:5,ALKPHOS:5,BILITOT:5,PROT:5,ALBUMIN:5 in the last 168 hours No results found for this basename: LIPASE:5,AMYLASE:5 in the last 168 hours No results found for this basename: AMMONIA:5 in the last 168 hours CBC:  Lab 07/30/12 0545 07/29/12 0515 07/28/12 0445 07/27/12 1918  WBC 7.4 8.5 5.3 6.4  NEUTROABS -- -- -- 4.9  HGB 12.1 12.0 11.7* 12.3  HCT 35.8* 35.3* 34.7* 36.9  MCV 94.0 93.9 93.8 93.9  PLT 243 239 232 259   Cardiac Enzymes: No results found for this basename: CKTOTAL:5,CKMB:5,CKMBINDEX:5,TROPONINI:5 in the last 168 hours BNP: BNP (last 3 results)  Basename 10/17/11 0715  PROBNP 233.6*   CBG:  Lab 07/27/12 1923  GLUCAP 97    Time coordinating discharge: 45 minutes  Signed:  Akiem Urieta  [facility] 07/30/2012, 8:56 AM

## 2012-08-19 ENCOUNTER — Encounter: Payer: Self-pay | Admitting: Internal Medicine

## 2012-08-19 ENCOUNTER — Ambulatory Visit (INDEPENDENT_AMBULATORY_CARE_PROVIDER_SITE_OTHER): Payer: Medicare Other | Admitting: Internal Medicine

## 2012-08-19 VITALS — BP 118/74 | HR 104 | Ht 63.0 in | Wt 130.8 lb

## 2012-08-19 DIAGNOSIS — F17200 Nicotine dependence, unspecified, uncomplicated: Secondary | ICD-10-CM

## 2012-08-19 DIAGNOSIS — J449 Chronic obstructive pulmonary disease, unspecified: Secondary | ICD-10-CM

## 2012-08-19 DIAGNOSIS — C3490 Malignant neoplasm of unspecified part of unspecified bronchus or lung: Secondary | ICD-10-CM

## 2012-08-19 DIAGNOSIS — R911 Solitary pulmonary nodule: Secondary | ICD-10-CM

## 2012-08-19 NOTE — Patient Instructions (Addendum)
We both agree it is really important for you to stop smoking. We will give you information on the [facility] Smoking Cessation program  Order future CT chest no contrast in 3 months   Dx left lung nodule, neurofibromatosis

## 2012-08-19 NOTE — Progress Notes (Signed)
Patient ID: [name], female    DOB: [birth year], 62 y.o.   MRN: [id number]  HPI  12/09/10- 72 yoF smoker followed for COPD, tobacco abuse and lung nodule, still smoking. Complicating hx of extensive neurofibromatosis, depression, osteopenia. Last here August 11, 2010 after hosp twice for exacerbations of COPD, unable to afford meds. Oxygen dependent. During hosp CXR showed 1.4x1.0 LUL nodule.  Since last here, she had exacerbation with dyspnea while visiting sister in [place]. They admitted her to [facility] again 2-3 weeks ago- breathing better, but upped O2 to 5 L. They did needle bx of the lung nodule and told her POS for CANCER. She knows no more than that with no f/u planned at [facility].We are requesting records. She wants care here where she lives w/ husband. Very anxious- asks help for nerves- has taken xanax 1 mg in past w/o oversedation. Feels ok. Denies cough, phlegm, blood, pain or swelling. Easy DOE across room. Feet not swelling.   12/30/10- 59 yoF smoker followed for COPD, tobacco abuse , still smoking. Complicating hx of extensive neurofibromatosis, depression, osteopenia. Adenoca LUL/ XRT,  CAD, neurofibromatosis, meningioma Since last here, cytopath from [facility] confirmed needle lung bx adenoca. PET 13 suv w/ no mets. CT brain showed stable meningioma. We reviewed discussion at [facility] Conference where recognition that this lesion is on major fissure means too much lung resection for her COPD. Instead we will refer for XRT. She is trying to quit smoking. Denies chest pain. Productive cough with light brownish sputum is routine for her. Denies blood, fever, nodes or breast nodule.  Asks help for sleep, saying that xanax isn't helping.   06/13/11-  59 yoF smoker followed for COPD, tobacco abuse , still smoking. Complicating hx of extensive neurofibromatosis, depression, osteopenia. 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 Has had flu vaccine. Still smoking about 10 cigarettes a day and we discussed ways to quit including transfer to electronic cigarettes. She remains on continuous oxygen 4 L per minute/Advanced. More short of breath in the last week and a half, coughing some green or yellow sputum and feeling heavy through the chest. Denies fever, sore throat, palpitations, edema, blood. Had a MRSA abscess left axilla surgically drained. Completed antibiotics 2 weeks ago. Home health nurses packing it. We called in a prednisone taper last night for bronchitic exacerbation.  10/10/11-  59 yoF smoker followed for COPD, tobacco abuse , still smoking. Complicating hx of extensive neurofibromatosis, depression, osteopenia. Adenoca LUL/ XRT,  CAD, meningioma,  CAD, She is interested in trying again to quit smoking. Failed patches and electronic cigarettes. History of depression makes Chantix a poor choice. Home oxygen concentrator set at 5/ oxygen saturation usually 98% at home.. Coughs a little, nonproductive. Denies chest pain, blood or purulent sputum. Taking no pulmonary meds but does have a nebulizer.. CT chest 08/14/11- reviewed with her: Plan f/u CT this summer. IMPRESSION:  1. Further reduction in conspicuity of the left upper lobe nodule.  2. Cutaneous findings of neurofibromatosis.  3. Severe emphysema.  4. Stable small hepatic and splenic lesions are probably benign  but technically nonspecific; these are not associated with this  observable hypermetabolic activity on recent PET CT.  Original Report Authenticated By: Dellia Cloud, M.D.    11/06/11- 60 yoF smoker followed for COPD, tobacco abuse , still smoking. Complicating hx of extensive neurofibromatosis, depression, osteopenia. Adenoca LUL/ XRT,  CAD, meningioma,  CA Post hospital-pt was shown how to use Advair HFA, Spiriva, and albuterol neb tx today; However, patient is unable to use Tudorza-has a hard time breathing deep enough to  get medication out of inhaler  Hospitalized at [facility] March 12 through March 30 on teaching service for exacerbation of COPD. Has reduced smoking to 4 cigarettes a day but finds it very hard going. Has not found nicotine substitutes useful. We discussed other measures. Husband smokes at home. Complains of "lungs hurt" indicating lower anterior bilateral chest. Asks pain medication. Had been taking Percocet but ran out out pending return to her primary office followup. Discussed history of substance abuse. She states she wants DO NOT RESUSCITATE/DO NOT INTUBATE status "no matter what my family thinks". Wears oxygen at 5 L to address sense of shortness of breath which is at least partly anxiety. Chest x-ray 09/01/2011-COPD, spiculated left upper lobe nodule 1 cm/in area of prior XRT, mild bilateral opacities.  03/07/12- 59 yoF smoker followed for COPD, tobacco abuse , still smoking. Complicating hx of extensive neurofibromatosis, depression, osteopenia. Adenoca LUL/ XRT,  CAD, meningioma,  CA. (DNR/DNI 11/06/11). Breathing(SOB) getting worse; stays in bed all the time; K+ stays too high . COPD assessment test (CAT) scored 19/40 Her physician did lab work and told her potassium was too high-for dietary management. She stays in a hospital bed much of each day watching TV. Remains on oxygen 5 L/Advanced. Mentions spinal cord stimulator for pain CT chest 02/29/12-images reviewed with her IMPRESSION:  1. Stable CT of the chest. No new or progressive disease  identified.  2. Emphysema  3. Prominent coronary artery calcifications.   08/19/12-  61 yoF smoker followed for COPD, tobacco abuse , still smoking. Complicating hx of extensive neurofibromatosis, depression, osteopenia. Adenoca LUL/ hx XRT,  CAD, meningioma,  CA. (DNR/DNI 11/06/11). FOLLOWS FOR: recent hospital  stay(passed out and COPD) states breathing has gotten worse since last OV and since being out of the hospital. Says she "lied" and told them at  hospital she was better so that would let her home for Christmas. Continues oxygen 5 L/Advanced. She still smokes about 10 cigarettes a day and says her primary physician recommended against Chantix for her. Followup radiation oncology next month watching left upper lobe nodule. CT chest 07/28/12 IMPRESSION:  Left upper lobe nodular opacity is mildly increased in size, mild  increased surrounding linear opacity. This is nonspecific.  Recommend short-term (2-3 month) follow-up.  Emphysema.  Other findings similar to prior as above.  Original Report Authenticated By: Jearld Lesch, M.D.    Review of Systems-See HPI Constitutional:   No-   weight loss, night sweats, fevers, chills, fatigue, lassitude. HEENT:   No-  headaches, difficulty swallowing, tooth/dental problems, sore throat,       No-  sneezing, itching, ear ache, nasal congestion, post nasal drip,  CV:  No- anginal  chest pain, orthopnea, PND, swelling in lower extremities, anasarca,  dizziness, palpitations Resp: +baseline shortness of breath with exertion or at rest.              +Some  productive cough,  No non-productive cough,  No- coughing up of blood.              No-   change in color of mucus.  No- wheezing.   Skin: No change in fibromatosis GI:  No-   heartburn, indigestion, abdominal pain, nausea, vomiting,  GU:  MS:  No-   joint pain or swelling.  Neuro-  nothing unusual Psych:  No- change in mood or affect. No depression or anxiety.  No memory loss.  Objective:   Physical Exam BP 118/74  Pulse 104  Ht 5\' 3"  (1.6 m)  Wt 130 lb 12.8 oz (59.33 kg)  BMI 23.17 kg/m2  SpO2 97% General- Alert, Oriented, Affect-appropriate, Distress- none acute; portable oxygen at 5 L, talkative . Odor of tobacco Skin- +neurofibromatosis  Lymphadenopathy- none Head- atraumatic            Eyes- Gross vision intact, PERRLA, conjunctivae clear secretions            Ears- Hearing, canals-normal            Nose- Clear, no-Septal  dev, mucus, polyps, erosion, perforation             Throat- Mallampati II , mucosa clear , drainage- none, tonsils- atrophic Neck- flexible , trachea midline, no stridor , thyroid nl, carotid no bruit Chest - symmetrical excursion , unlabored           Heart/CV- RRR , no murmur , no gallop  , no rub, nl s1 s2                           - JVD- none , edema- none, stasis changes- none, varices- none           Lung- clear to P&A but quite distant, wheeze- none, cough- none , dullness-none, rub- none           Chest wall-  Abd-  Br/ Gen/ Rectal- Not done, not indicated Extrem- cyanosis- none, clubbing, none, atrophy- none, strength- nl Neuro- grossly intact to observation

## 2012-08-27 ENCOUNTER — Encounter (HOSPITAL_COMMUNITY): Payer: Self-pay

## 2012-08-27 ENCOUNTER — Emergency Department (HOSPITAL_COMMUNITY)
Admission: EM | Admit: 2012-08-27 | Discharge: 2012-08-27 | Disposition: A | Discharge status: Home / Self Care | Payer: Medicare Other | Attending: Emergency Medicine | Admitting: Emergency Medicine

## 2012-08-27 DIAGNOSIS — J4489 Other specified chronic obstructive pulmonary disease: Secondary | ICD-10-CM | POA: Insufficient documentation

## 2012-08-27 DIAGNOSIS — Z860100 Personal history of colon polyps, unspecified: Secondary | ICD-10-CM | POA: Insufficient documentation

## 2012-08-27 DIAGNOSIS — Z85118 Personal history of other malignant neoplasm of bronchus and lung: Secondary | ICD-10-CM | POA: Insufficient documentation

## 2012-08-27 DIAGNOSIS — Z9889 Other specified postprocedural states: Secondary | ICD-10-CM | POA: Insufficient documentation

## 2012-08-27 DIAGNOSIS — F1910 Other psychoactive substance abuse, uncomplicated: Secondary | ICD-10-CM | POA: Insufficient documentation

## 2012-08-27 DIAGNOSIS — F17200 Nicotine dependence, unspecified, uncomplicated: Secondary | ICD-10-CM | POA: Insufficient documentation

## 2012-08-27 DIAGNOSIS — M810 Age-related osteoporosis without current pathological fracture: Secondary | ICD-10-CM | POA: Insufficient documentation

## 2012-08-27 DIAGNOSIS — M545 Low back pain, unspecified: Secondary | ICD-10-CM | POA: Insufficient documentation

## 2012-08-27 DIAGNOSIS — M549 Dorsalgia, unspecified: Secondary | ICD-10-CM

## 2012-08-27 DIAGNOSIS — M1990 Unspecified osteoarthritis, unspecified site: Secondary | ICD-10-CM | POA: Insufficient documentation

## 2012-08-27 DIAGNOSIS — F329 Major depressive disorder, single episode, unspecified: Secondary | ICD-10-CM | POA: Insufficient documentation

## 2012-08-27 DIAGNOSIS — IMO0002 Reserved for concepts with insufficient information to code with codable children: Secondary | ICD-10-CM | POA: Insufficient documentation

## 2012-08-27 DIAGNOSIS — Z8719 Personal history of other diseases of the digestive system: Secondary | ICD-10-CM | POA: Insufficient documentation

## 2012-08-27 DIAGNOSIS — Z7982 Long term (current) use of aspirin: Secondary | ICD-10-CM | POA: Insufficient documentation

## 2012-08-27 DIAGNOSIS — Z8739 Personal history of other diseases of the musculoskeletal system and connective tissue: Secondary | ICD-10-CM | POA: Insufficient documentation

## 2012-08-27 DIAGNOSIS — Z79899 Other long term (current) drug therapy: Secondary | ICD-10-CM | POA: Insufficient documentation

## 2012-08-27 DIAGNOSIS — F411 Generalized anxiety disorder: Secondary | ICD-10-CM | POA: Insufficient documentation

## 2012-08-27 DIAGNOSIS — Z8709 Personal history of other diseases of the respiratory system: Secondary | ICD-10-CM | POA: Insufficient documentation

## 2012-08-27 DIAGNOSIS — I10 Essential (primary) hypertension: Secondary | ICD-10-CM | POA: Insufficient documentation

## 2012-08-27 DIAGNOSIS — F3289 Other specified depressive episodes: Secondary | ICD-10-CM | POA: Insufficient documentation

## 2012-08-27 DIAGNOSIS — I509 Heart failure, unspecified: Secondary | ICD-10-CM | POA: Insufficient documentation

## 2012-08-27 DIAGNOSIS — J449 Chronic obstructive pulmonary disease, unspecified: Secondary | ICD-10-CM

## 2012-08-27 MED ORDER — HYDROCODONE-ACETAMINOPHEN 5-325 MG PO TABS: 1.0000 | ORAL_TABLET | ORAL | Status: DC | PRN

## 2012-08-27 MED ORDER — HYDROMORPHONE HCL PF 1 MG/ML IJ SOLN
1.0000 mg | Freq: Once | INTRAMUSCULAR | Status: AC
  Administered 2012-08-27: 1 mg via INTRAMUSCULAR
  Filled 2012-08-27: qty 1

## 2012-08-27 MED ORDER — ALBUTEROL SULFATE (5 MG/ML) 0.5% IN NEBU
5.0000 mg | INHALATION_SOLUTION | Freq: Once | RESPIRATORY_TRACT | Status: AC
  Administered 2012-08-27: 5 mg via RESPIRATORY_TRACT
  Filled 2012-08-27: qty 1

## 2012-08-27 MED ORDER — DIAZEPAM 5 MG PO TABS
5.0000 mg | ORAL_TABLET | Freq: Once | ORAL | Status: AC
  Administered 2012-08-27: 5 mg via ORAL
  Filled 2012-08-27: qty 1

## 2012-08-27 MED ORDER — IBUPROFEN 200 MG PO TABS
400.0000 mg | ORAL_TABLET | Freq: Once | ORAL | Status: AC
  Administered 2012-08-27: 400 mg via ORAL
  Filled 2012-08-27: qty 2

## 2012-08-27 MED ORDER — IPRATROPIUM BROMIDE 0.02 % IN SOLN
0.5000 mg | Freq: Once | RESPIRATORY_TRACT | Status: AC
  Administered 2012-08-27: 0.5 mg via RESPIRATORY_TRACT
  Filled 2012-08-27: qty 2.5

## 2012-08-27 NOTE — ED Provider Notes (Signed)
History    62yF with back pain. Chronic in nature, but out of pain meds. Denies acute trauma. No fever or chills. No acute numbness, tingling or loss of strength. No urinary complaints. No incontinence/retention. Does have CA hx of relatively frequent steroid use because of COPD. Typically takes percocet/7.5.  CSN: [id number]  Arrival date & time 08/27/12  1245   First MD Initiated Contact with Patient 08/27/12 1540      Chief Complaint  Patient presents with  . Back Pain    (Consider location/radiation/quality/duration/timing/severity/associated sxs/prior treatment) HPI  Past Medical History  Diagnosis Date  . DJD (degenerative joint disease)   . COPD (chronic obstructive pulmonary disease)   . Tobacco dependence 2000    low back  . Avascular necrosis   . Osteoporosis   . Substance abuse   . 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 Hyperplastic colonic polyp   . CHF (congestive heart failure)   . Abnormal weight loss   . Essential hypertension, benign   . Depressive disorder, not elsewhere classified   . Constipation   . Low back pain   . Anxiety   . Lung cancer   . Shortness of breath     Past Surgical History  Procedure Date  . Laminectomy 02/2003  . Back surgery     multiple  . Tubal ligation   . Vesicovaginal fistula closure w/ tah   . Cholecystectomy   . Tonsilectomy, adenoidectomy, bilateral myringotomy and tubes   . Hip surgery     right hip, metal plate    Family History  Problem Relation Age of Onset  . Lung cancer Father   . Emphysema Mother   . Neurofibromatosis Mother   . Liver disease      History  Substance Use Topics  . Smoking status: Current Some Day Smoker -- 1.0 packs/day for 35 years    Types: Cigarettes  . Smokeless tobacco: Never Used  . Alcohol Use: No    OB History    Grav Para Term Preterm Abortions TAB SAB Ect Mult Living                  Review of Systems  All systems reviewed and negative, other than as noted in  HPI.   Allergies  Penicillins  Home Medications   Current Outpatient Rx  Name  Route  Sig  Dispense  Refill  . ALBUTEROL SULFATE HFA 108 (90 BASE) MCG/ACT IN AERS   Inhalation   Inhale 2 puffs into the lungs every 4 (four) hours as needed for wheezing or shortness of breath. For shortness of breath   1 each   1   . ALPRAZOLAM 0.5 MG PO TABS   Oral   Take 0.5 mg by mouth 4 (four) times daily as needed. For anxiety         . EXCEDRIN PO   Oral   Take 1 tablet by mouth every 6 (six) hours as needed. For headache         . ATORVASTATIN CALCIUM 10 MG PO TABS   Oral   Take 10 mg by mouth daily.          . BUDESONIDE-FORMOTEROL FUMARATE 160-4.5 MCG/ACT IN AERO   Inhalation   Inhale 2 puffs into the lungs 2 (two) times daily.   1 Inhaler   1   . FLUTICASONE PROPIONATE 50 MCG/ACT NA SUSP   Nasal   Place 2 sprays into the nose daily.   16 g   1   .  HYDROCHLOROTHIAZIDE 25 MG PO TABS   Oral   Take 1 tablet (25 mg total) by mouth daily.   30 tablet   1   . LUNESTA 1 MG PO TABS   Oral   Take 1 mg by mouth at bedtime.          Marland Kitchen MIRTAZAPINE 7.5 MG PO TABS   Oral   Take 1 tablet (7.5 mg total) by mouth at bedtime.   30 tablet   3   . OMEPRAZOLE 40 MG PO CPDR   Oral   Take 1 capsule (40 mg total) by mouth 2 (two) times daily.   60 capsule   1   . OXYCODONE-ACETAMINOPHEN 7.5-325 MG PO TABS   Oral   Take 1 tablet by mouth every 6 (six) hours as needed for pain. For pain   30 tablet   0   . QUETIAPINE FUMARATE 200 MG PO TABS   Oral   Take 200 mg by mouth at bedtime.         Marland Kitchen TIOTROPIUM BROMIDE MONOHYDRATE 18 MCG IN CAPS   Inhalation   Place 1 capsule (18 mcg total) into inhaler and inhale daily.   30 capsule   1     BP 117/86  Pulse 95  Temp 97.3 F (36.3 C) (Oral)  Resp 20  SpO2 95%  Physical Exam  Nursing note and vitals reviewed. Constitutional: She is oriented to person, place, and time. She appears well-developed and well-nourished.  No distress.  HENT:  Head: Normocephalic and atraumatic.  Eyes: Conjunctivae normal are normal. Right eye exhibits no discharge. Left eye exhibits no discharge.  Neck: Neck supple.  Cardiovascular: Normal rate, regular rhythm and normal heart sounds.  Exam reveals no gallop and no friction rub.   No murmur heard. Pulmonary/Chest: Effort normal. No respiratory distress. She has wheezes.  Abdominal: Soft. She exhibits no distension. There is no tenderness.  Musculoskeletal: She exhibits no edema and no tenderness.       Arms:      Mild tenderness in depicted area. No concerning skin changes.  Neurological: She is alert and oriented to person, place, and time. No cranial nerve deficit. She exhibits normal muscle tone. Coordination normal.  Skin: Skin is warm and dry.  Psychiatric: She has a normal mood and affect. Her behavior is normal. Thought content normal.    ED Course  Procedures (including critical care time)  Labs Reviewed - No data to display No results found.   1. Back pain   2. COPD (chronic obstructive pulmonary disease)       MDM  62 year old female with back pain. Likely exacerbation of her chronic pain. No acute injury. Nonfocal neurological examination. Patient was treated symptomatically in the emergency room with improvement of symptoms. She will be discharged with a prescription for additional meds. Also some wheezing on exam, but no distress. Hx of COPD. Nebs given in ED but given stability of symptoms at/near her baseline, will defer from steroids/abx.        Raeford Razor, MD 08/30/12 1357

## 2012-08-27 NOTE — ED Notes (Signed)
Lower back pain very severe, pt. States "That  My pain medication is not helping"

## 2012-08-31 NOTE — Assessment & Plan Note (Signed)
Plan-followup chest CT in May, 2014

## 2012-08-31 NOTE — Assessment & Plan Note (Signed)
Plan-refer to [facility]

## 2012-08-31 NOTE — Assessment & Plan Note (Addendum)
Severe COPD with chronic hypoxic respiratory failure O2 at 3-4 L would be adequate as discussed

## 2012-09-17 ENCOUNTER — Encounter: Payer: Self-pay | Admitting: Radiation Oncology

## 2012-09-17 DIAGNOSIS — Z923 Personal history of irradiation: Secondary | ICD-10-CM | POA: Insufficient documentation

## 2012-09-17 DIAGNOSIS — J449 Chronic obstructive pulmonary disease, unspecified: Secondary | ICD-10-CM | POA: Insufficient documentation

## 2012-09-17 DIAGNOSIS — I509 Heart failure, unspecified: Secondary | ICD-10-CM | POA: Insufficient documentation

## 2012-09-19 ENCOUNTER — Ambulatory Visit: Payer: Medicare Other | Admitting: Radiation Oncology

## 2012-09-26 ENCOUNTER — Encounter: Payer: Self-pay | Admitting: Radiation Oncology

## 2012-09-26 ENCOUNTER — Ambulatory Visit
Admission: RE | Admit: 2012-09-26 | Discharge: 2012-09-26 | Disposition: A | Discharge status: Home / Self Care | Payer: Medicare Other | Source: Ambulatory Visit | Attending: Radiation Oncology | Admitting: Radiation Oncology

## 2012-09-26 VITALS — BP 145/78 | HR 92 | Temp 98.2°F | Resp 20 | Wt 132.4 lb

## 2012-09-26 DIAGNOSIS — C3490 Malignant neoplasm of unspecified part of unspecified bronchus or lung: Secondary | ICD-10-CM

## 2012-09-26 NOTE — Progress Notes (Signed)
Department of Radiation Oncology  Phone:  [phone] Fax:        [phone]   Name: [name] MRN: [id number]  DOB: Jan 14, 1951  Date: 09/26/2012  Follow Up Visit Note  Diagnosis: Stage I non-small cell lung cancer of the left upper lobe  Summary and Interval since last radiation: 18 months (60 gray completed July 2012)  Interval History: [name] presents today for routine followup.  She has been in and out of the hospital. It she is on 5 L nasal cannula. She had a CT scan in December which showed a possible slight increase in the scar tissue along the left upper lobe. She has followup in a CT scheduled with Dr. Maple Hudson in April.  Allergies:  Allergies  Allergen Reactions  . Penicillins Rash    Medications:  Current Outpatient Prescriptions  Medication Sig Dispense Refill  . albuterol (PROVENTIL HFA;VENTOLIN HFA) 108 (90 BASE) MCG/ACT inhaler Inhale 2 puffs into the lungs every 4 (four) hours as needed for wheezing or shortness of breath. For shortness of breath  1 each  1  . ALPRAZolam (XANAX) 0.5 MG tablet Take 0.5 mg by mouth 4 (four) times daily as needed. For anxiety      . Aspirin-Acetaminophen-Caffeine (EXCEDRIN PO) Take 2 tablets by mouth every 6 (six) hours as needed. For headache      . atorvastatin (LIPITOR) 10 MG tablet Take 10 mg by mouth daily.       . budesonide-formoterol (SYMBICORT) 160-4.5 MCG/ACT inhaler Inhale 1 puff into the lungs 2 (two) times daily.      . fluticasone (FLONASE) 50 MCG/ACT nasal spray Place 2 sprays into the nose daily.      Marland Kitchen lisinopril (PRINIVIL,ZESTRIL) 40 MG tablet Take 40 mg by mouth daily.      Alfonso Patten 1 MG TABS Take 1 mg by mouth at bedtime.       . mirtazapine (REMERON) 7.5 MG tablet Take 1 tablet (7.5 mg total) by mouth at bedtime.  30 tablet  3  . oxyCODONE-acetaminophen (PERCOCET) 7.5-325 MG per tablet Take 1 tablet by mouth every 6 (six) hours as needed for pain. For pain  30 tablet  0  . QUEtiapine (SEROQUEL) 200 MG  tablet Take 200 mg by mouth at bedtime.      Marland Kitchen tiotropium (SPIRIVA HANDIHALER) 18 MCG inhalation capsule Place 1 capsule (18 mcg total) into inhaler and inhale daily.  30 capsule  1   No current facility-administered medications for this encounter.    Physical Exam:  Filed Vitals:   09/26/12 1518  BP: 145/78  Pulse: 92  Temp: 98.2 F (36.8 C)  Resp: 20   she is a chronically ill-appearing female with a nasal cannula in place sitting comfortably on examining room chair.  IMPRESSION: [name] is a 62 y.o. female status post SBRT to this left upper lobe lesion  PLAN:  I reviewed her imaging findings. This measurement is 1 mm it best and I think really is an overcall. If you compare her pre-treatment imaging for currently seeing there really is minimal change and she's had an excellent response. She does continue to smoke unfortunately and is not interested in quitting. She has been told apparently by multiple providers that she is not candidate for pharmacotherapy. This does place her high-risk than normal risk for recurrence however I'm not sure she would even be a candidate for salvage treatment. She has discussed her desire to BE DO NOT RESUSCITATE with her family and has  a paperwork at home with her. I will plan on seeing her back in a year with a followup CT scan. If there any questions regarding her April scan I would be happy to discuss that with Dr. Maple Hudson.     Lurline Hare, MD

## 2012-10-23 ENCOUNTER — Other Ambulatory Visit: Payer: Self-pay | Admitting: *Deleted

## 2012-10-23 MED ORDER — ATORVASTATIN CALCIUM 10 MG PO TABS: 10.0000 mg | ORAL_TABLET | Freq: Every day | ORAL | Status: DC

## 2012-11-04 ENCOUNTER — Encounter (HOSPITAL_COMMUNITY): Payer: Self-pay | Admitting: Emergency Medicine

## 2012-11-04 ENCOUNTER — Emergency Department (HOSPITAL_COMMUNITY): Payer: Medicare Other

## 2012-11-04 ENCOUNTER — Emergency Department (HOSPITAL_COMMUNITY)
Admission: EM | Admit: 2012-11-04 | Discharge: 2012-11-05 | Disposition: A | Discharge status: Home / Self Care | Payer: Medicare Other | Attending: Emergency Medicine | Admitting: Emergency Medicine

## 2012-11-04 DIAGNOSIS — F17200 Nicotine dependence, unspecified, uncomplicated: Secondary | ICD-10-CM | POA: Insufficient documentation

## 2012-11-04 DIAGNOSIS — Z79899 Other long term (current) drug therapy: Secondary | ICD-10-CM | POA: Insufficient documentation

## 2012-11-04 DIAGNOSIS — Z860100 Personal history of colon polyps, unspecified: Secondary | ICD-10-CM | POA: Insufficient documentation

## 2012-11-04 DIAGNOSIS — Z862 Personal history of diseases of the blood and blood-forming organs and certain disorders involving the immune mechanism: Secondary | ICD-10-CM | POA: Insufficient documentation

## 2012-11-04 DIAGNOSIS — J4489 Other specified chronic obstructive pulmonary disease: Secondary | ICD-10-CM | POA: Insufficient documentation

## 2012-11-04 DIAGNOSIS — I10 Essential (primary) hypertension: Secondary | ICD-10-CM | POA: Insufficient documentation

## 2012-11-04 DIAGNOSIS — Z8639 Personal history of other endocrine, nutritional and metabolic disease: Secondary | ICD-10-CM | POA: Insufficient documentation

## 2012-11-04 DIAGNOSIS — Z8719 Personal history of other diseases of the digestive system: Secondary | ICD-10-CM | POA: Insufficient documentation

## 2012-11-04 DIAGNOSIS — R5381 Other malaise: Secondary | ICD-10-CM | POA: Insufficient documentation

## 2012-11-04 DIAGNOSIS — Z923 Personal history of irradiation: Secondary | ICD-10-CM | POA: Insufficient documentation

## 2012-11-04 DIAGNOSIS — R079 Chest pain, unspecified: Secondary | ICD-10-CM | POA: Insufficient documentation

## 2012-11-04 DIAGNOSIS — F3289 Other specified depressive episodes: Secondary | ICD-10-CM | POA: Insufficient documentation

## 2012-11-04 DIAGNOSIS — M810 Age-related osteoporosis without current pathological fracture: Secondary | ICD-10-CM | POA: Insufficient documentation

## 2012-11-04 DIAGNOSIS — Z87828 Personal history of other (healed) physical injury and trauma: Secondary | ICD-10-CM | POA: Insufficient documentation

## 2012-11-04 DIAGNOSIS — F411 Generalized anxiety disorder: Secondary | ICD-10-CM | POA: Insufficient documentation

## 2012-11-04 DIAGNOSIS — R0602 Shortness of breath: Secondary | ICD-10-CM

## 2012-11-04 DIAGNOSIS — Z8739 Personal history of other diseases of the musculoskeletal system and connective tissue: Secondary | ICD-10-CM | POA: Insufficient documentation

## 2012-11-04 DIAGNOSIS — I509 Heart failure, unspecified: Secondary | ICD-10-CM | POA: Insufficient documentation

## 2012-11-04 DIAGNOSIS — Z85118 Personal history of other malignant neoplasm of bronchus and lung: Secondary | ICD-10-CM | POA: Insufficient documentation

## 2012-11-04 DIAGNOSIS — Z9089 Acquired absence of other organs: Secondary | ICD-10-CM | POA: Insufficient documentation

## 2012-11-04 DIAGNOSIS — F329 Major depressive disorder, single episode, unspecified: Secondary | ICD-10-CM | POA: Insufficient documentation

## 2012-11-04 DIAGNOSIS — Z85848 Personal history of malignant neoplasm of other parts of nervous tissue: Secondary | ICD-10-CM | POA: Insufficient documentation

## 2012-11-04 DIAGNOSIS — J449 Chronic obstructive pulmonary disease, unspecified: Secondary | ICD-10-CM | POA: Insufficient documentation

## 2012-11-04 LAB — CBC WITH DIFFERENTIAL/PLATELET
Eosinophils Absolute: 0 10*3/uL (ref 0.0–0.7)
Hemoglobin: 12.7 g/dL (ref 12.0–15.0)
Lymphocytes Relative: 11 % — ABNORMAL LOW (ref 12–46)
Lymphs Abs: 0.5 10*3/uL — ABNORMAL LOW (ref 0.7–4.0)
MCH: 31.2 pg (ref 26.0–34.0)
Monocytes Relative: 8 % (ref 3–12)
Neutro Abs: 3.8 10*3/uL (ref 1.7–7.7)
Neutrophils Relative %: 81 % — ABNORMAL HIGH (ref 43–77)
Platelets: 245 10*3/uL (ref 150–400)
RBC: 4.07 MIL/uL (ref 3.87–5.11)
WBC: 4.7 10*3/uL (ref 4.0–10.5)

## 2012-11-04 LAB — D-DIMER, QUANTITATIVE: D-Dimer, Quant: 0.37 ug/mL-FEU (ref 0.00–0.48)

## 2012-11-04 LAB — POCT I-STAT TROPONIN I: Troponin i, poc: 0 ng/mL (ref 0.00–0.08)

## 2012-11-04 MED ORDER — MORPHINE SULFATE 4 MG/ML IJ SOLN
4.0000 mg | Freq: Once | INTRAMUSCULAR | Status: AC
  Administered 2012-11-04: 4 mg via INTRAVENOUS
  Filled 2012-11-04: qty 1

## 2012-11-04 MED ORDER — SODIUM CHLORIDE 0.9 % IV SOLN
INTRAVENOUS | Status: DC
  Administered 2012-11-04: 23:00:00 via INTRAVENOUS

## 2012-11-04 NOTE — ED Provider Notes (Signed)
History     CSN: [id number]  Arrival date & time 11/04/12  2244   First MD Initiated Contact with Patient 11/04/12 2252      Chief Complaint  Patient presents with  . Shortness of Breath  . Chest Pain    (Consider location/radiation/quality/duration/timing/severity/associated sxs/prior treatment) HPI Comments: Pt with PMH significant for lung CA, CHF, COPD, emphysema, anxiety, presents to the ED for progressive SOB and CP since approx 5pm.  Pain described as constant, sharp, mid-sternal without radiation.  Notes productive cough with green/yellow sputum over the past 2 weeks following a family gathering.  Decreased appetite and feelings of fatigue.  Pt on 5L O2 via [place] at all times.  Denies any nausea, vomiting, diarrhea, or dysuria.  Is regularly followed by pulmonology.   Patient is a 62 y.o. female presenting with shortness of breath and chest pain. The history is provided by the patient.  Shortness of Breath Associated symptoms: chest pain   Chest Pain Associated symptoms: fatigue and shortness of breath     Past Medical History  Diagnosis Date  . DJD (degenerative joint disease)   . COPD (chronic obstructive pulmonary disease)   . Tobacco dependence 2000    low back  . Avascular necrosis   . Osteoporosis   . Substance abuse   . 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 Hyperplastic colonic polyp   . CHF (congestive heart failure)   . Abnormal weight loss   . Essential hypertension, benign   . Depressive disorder, not elsewhere classified   . Constipation   . Low back pain   . Anxiety   . Lung cancer 2012    LUL  . Shortness of breath   . Traumatic pneumothorax     history of  . History of radiation therapy 02/06/11 -02/17/11    LUL    Past Surgical History  Procedure Laterality Date  . Laminectomy  02/2003  . Back surgery      multiple  . Tubal ligation    . Vesicovaginal fistula closure w/ tah    . Cholecystectomy    . Tonsilectomy, adenoidectomy, bilateral myringotomy  and tubes    . Hip surgery      right hip, metal plate    Family History  Problem Relation Age of Onset  . Lung cancer Father   . Emphysema Mother   . Neurofibromatosis Mother   . Liver disease      History  Substance Use Topics  . Smoking status: Current Some Day Smoker -- 1.00 packs/day for 35 years    Types: Cigarettes  . Smokeless tobacco: Never Used  . Alcohol Use: No    OB History   Grav Para Term Preterm Abortions TAB SAB Ect Mult Living                  Review of Systems  Constitutional: Positive for appetite change and fatigue.  Respiratory: Positive for shortness of breath.   Cardiovascular: Positive for chest pain.  All other systems reviewed and are negative.    Allergies  Penicillins  Home Medications   Current Outpatient Rx  Name  Route  Sig  Dispense  Refill  . albuterol (PROVENTIL HFA;VENTOLIN HFA) 108 (90 BASE) MCG/ACT inhaler   Inhalation   Inhale 2 puffs into the lungs every 4 (four) hours as needed for wheezing or shortness of breath. For shortness of breath   1 each   1   . ALPRAZolam (XANAX) 0.5 MG tablet   Oral  Take 0.5 mg by mouth 4 (four) times daily as needed. For anxiety         . Aspirin-Acetaminophen-Caffeine (EXCEDRIN PO)   Oral   Take 2 tablets by mouth every 6 (six) hours as needed. For headache         . atorvastatin (LIPITOR) 10 MG tablet   Oral   Take 10 mg by mouth daily.          Marland Kitchen atorvastatin (LIPITOR) 10 MG tablet   Oral   Take 1 tablet (10 mg total) by mouth daily.   90 tablet   3   . budesonide-formoterol (SYMBICORT) 160-4.5 MCG/ACT inhaler   Inhalation   Inhale 1 puff into the lungs 2 (two) times daily.         . fluticasone (FLONASE) 50 MCG/ACT nasal spray   Nasal   Place 2 sprays into the nose daily.         Marland Kitchen lisinopril (PRINIVIL,ZESTRIL) 40 MG tablet   Oral   Take 40 mg by mouth daily.         Alfonso Patten 1 MG TABS   Oral   Take 1 mg by mouth at bedtime.          .  mirtazapine (REMERON) 7.5 MG tablet   Oral   Take 1 tablet (7.5 mg total) by mouth at bedtime.   30 tablet   3   . oxyCODONE-acetaminophen (PERCOCET) 7.5-325 MG per tablet   Oral   Take 1 tablet by mouth every 6 (six) hours as needed for pain. For pain   30 tablet   0   . QUEtiapine (SEROQUEL) 200 MG tablet   Oral   Take 200 mg by mouth at bedtime.         Marland Kitchen tiotropium (SPIRIVA HANDIHALER) 18 MCG inhalation capsule   Inhalation   Place 1 capsule (18 mcg total) into inhaler and inhale daily.   30 capsule   1     There were no vitals taken for this visit.  Physical Exam  Nursing note and vitals reviewed. Constitutional: She is oriented to person, place, and time. She appears well-developed and well-nourished.  HENT:  Head: Normocephalic and atraumatic. No trismus in the jaw.  Right Ear: Tympanic membrane and ear canal normal.  Left Ear: Tympanic membrane and ear canal normal.  Nose: Nose normal.  Mouth/Throat: Oropharynx is clear and moist and mucous membranes are normal. She has dentures. No oral lesions. No oropharyngeal exudate, posterior oropharyngeal edema, posterior oropharyngeal erythema or tonsillar abscesses.  Eyes: Conjunctivae and EOM are normal. Pupils are equal, round, and reactive to light.  Neck: Normal range of motion. Neck supple.  Cardiovascular: Normal rate, regular rhythm and normal heart sounds.   Pulmonary/Chest: Effort normal. She has wheezes (bilaterally).  Abdominal: Soft. Bowel sounds are normal. There is no tenderness. There is no CVA tenderness, no tenderness at McBurney's point and negative Murphy's sign.  Musculoskeletal: Normal range of motion. She exhibits no edema.  Lymphadenopathy:    She has no cervical adenopathy.  Neurological: She is alert and oriented to person, place, and time. She has normal strength. No cranial nerve deficit or sensory deficit.  Normal strength UE and LE bilaterally, no facial droop appreciated  Skin: Skin is warm  and dry.  Psychiatric: She has a normal mood and affect.    ED Course  Procedures (including critical care time)   Date: 11/05/2012  Rate: 105  Rhythm: sinus tachycardia  QRS Axis: normal  Intervals: normal  ST/T Wave abnormalities: normal  Conduction Disutrbances:none  Narrative Interpretation: borderline prolonged QT and RAD  Old EKG Reviewed: unchanged    Labs Reviewed  CBC WITH DIFFERENTIAL - Abnormal; Notable for the following:    Neutrophils Relative 81 (*)    Lymphocytes Relative 11 (*)    Lymphs Abs 0.5 (*)    All other components within normal limits  BASIC METABOLIC PANEL - Abnormal; Notable for the following:    Glucose, Bld 130 (*)    GFR calc non Af Amer 72 (*)    GFR calc Af Amer 84 (*)    All other components within normal limits  PRO B NATRIURETIC PEPTIDE - Abnormal; Notable for the following:    Pro B Natriuretic peptide (BNP) 162.4 (*)    All other components within normal limits  D-DIMER, QUANTITATIVE  POCT I-STAT TROPONIN I   Dg Chest 2 View  11/05/2012  *RADIOLOGY REPORT*  Clinical Data: Shortness of breath.  History of lung cancer and congestive heart failure.  CHEST - 2 VIEW  Comparison: 07/27/2012  Findings: Shallow inspiration.  Normal heart size and pulmonary vascularity.  Interstitial hazy opacities in the lung bases may represent interstitial fibrosis and appears stable since previous study.  No focal consolidation or developing airspace disease.  No blunting of costophrenic angles.  No pneumothorax.  Tortuous aorta. Stimulator device in the thoracic spine.  Vague nodular opacity in the left upper lung measures 13 mm.  This is less well demonstrated on the previous study but appears stable.  IMPRESSION: Stable appearance of the chest with chronic fibrosis and left upper lung nodular opacity.  No evidence of active consolidation.   Original Report Authenticated By: Burman Nieves, M.D.      1. Shortness of breath   2. Chest pain       MDM    Pt presenting to the ED for SOB and mid-sternal chest pain x few hours.  Noted productive cough over the past 2 weeks with green/yellow sputum.  Hx of lung cancer, CHF, COPD- on 5L O2 via [place] at all times.  CXR with chronic changes.  Labs largely normal, cardiac work up negative.  Good relief of sx with albuterol/atrovent neb and prednisone. VS stable- pt ok for d/c.  Given pts hx, will start on levaquin and short course of steroids, refill spiriva and albuterol inhalers.  Pt has scheduled follow up with PCP and pulmonologist next week.  Discussed with Dr. Estell Harpin who agrees with plan.  Return precations advised.        Garlon Hatchet, PA-C 11/05/12 1141

## 2012-11-04 NOTE — ED Notes (Signed)
Pt c/o sob and chest pain. Pt states her sob began around 5pm and began to get worse. Pt initial resp 20, O2 sat 98 % 5L, pt O2 sat 90% RA. Ot has hx of COPD, CHF, Emphysem, anxiety, and Lung Cancer. Vitas 118/86, 110 HR, 16R, 98% 5 L.

## 2012-11-04 NOTE — ED Notes (Addendum)
Pt c/o central cp with no radiation described as stabbing in nature. Pt rates pain 7/10. Pt also states she has had a productive cough for a week, with yellow green sputum.

## 2012-11-05 LAB — BASIC METABOLIC PANEL
BUN: 17 mg/dL (ref 6–23)
CO2: 23 mEq/L (ref 19–32)
Chloride: 105 mEq/L (ref 96–112)
Glucose, Bld: 130 mg/dL — ABNORMAL HIGH (ref 70–99)
Potassium: 3.6 mEq/L (ref 3.5–5.1)
Sodium: 141 mEq/L (ref 135–145)

## 2012-11-05 MED ORDER — METHYLPREDNISOLONE 4 MG PO KIT: PACK | ORAL | Status: DC

## 2012-11-05 MED ORDER — PREDNISONE 20 MG PO TABS
40.0000 mg | ORAL_TABLET | Freq: Once | ORAL | Status: AC
  Administered 2012-11-05: 40 mg via ORAL
  Filled 2012-11-05: qty 2

## 2012-11-05 MED ORDER — TIOTROPIUM BROMIDE MONOHYDRATE 18 MCG IN CAPS: 18.0000 ug | ORAL_CAPSULE | Freq: Every day | RESPIRATORY_TRACT | Status: DC

## 2012-11-05 MED ORDER — ALBUTEROL SULFATE (5 MG/ML) 0.5% IN NEBU
5.0000 mg | INHALATION_SOLUTION | Freq: Once | RESPIRATORY_TRACT | Status: AC
  Administered 2012-11-05: 5 mg via RESPIRATORY_TRACT
  Filled 2012-11-05: qty 1

## 2012-11-05 MED ORDER — ALBUTEROL SULFATE HFA 108 (90 BASE) MCG/ACT IN AERS: 1.0000 | INHALATION_SPRAY | Freq: Four times a day (QID) | RESPIRATORY_TRACT | Status: DC | PRN

## 2012-11-05 MED ORDER — IPRATROPIUM BROMIDE 0.02 % IN SOLN
0.5000 mg | Freq: Once | RESPIRATORY_TRACT | Status: AC
  Administered 2012-11-05: 0.5 mg via RESPIRATORY_TRACT
  Filled 2012-11-05: qty 2.5

## 2012-11-05 MED ORDER — LEVOFLOXACIN 750 MG PO TABS: 750.0000 mg | ORAL_TABLET | Freq: Every day | ORAL | Status: DC

## 2012-11-05 NOTE — ED Provider Notes (Signed)
Medical screening examination/treatment/procedure(s) were conducted as a shared visit with non-physician practitioner(s) and myself.  I personally evaluated the patient during the encounter   Benny Lennert, MD 11/05/12 [phone]

## 2012-11-07 ENCOUNTER — Other Ambulatory Visit: Payer: Self-pay | Admitting: *Deleted

## 2012-11-07 MED ORDER — ALPRAZOLAM 0.5 MG PO TABS: ORAL_TABLET | ORAL | Status: DC

## 2012-11-07 MED ORDER — OXYCODONE-ACETAMINOPHEN 7.5-325 MG PO TABS: 1.0000 | ORAL_TABLET | Freq: Four times a day (QID) | ORAL | Status: DC | PRN

## 2012-11-08 ENCOUNTER — Ambulatory Visit: Payer: Self-pay | Admitting: Internal Medicine

## 2012-11-13 ENCOUNTER — Other Ambulatory Visit: Payer: Self-pay | Admitting: Internal Medicine

## 2012-11-18 ENCOUNTER — Ambulatory Visit (INDEPENDENT_AMBULATORY_CARE_PROVIDER_SITE_OTHER)
Admission: RE | Admit: 2012-11-18 | Discharge: 2012-11-18 | Disposition: A | Discharge status: Home / Self Care | Payer: Medicare Other | Source: Ambulatory Visit | Attending: Internal Medicine | Admitting: Internal Medicine

## 2012-11-18 DIAGNOSIS — R911 Solitary pulmonary nodule: Secondary | ICD-10-CM

## 2012-11-21 ENCOUNTER — Ambulatory Visit (INDEPENDENT_AMBULATORY_CARE_PROVIDER_SITE_OTHER): Payer: Medicare Other | Admitting: Internal Medicine

## 2012-11-21 ENCOUNTER — Encounter: Payer: Self-pay | Admitting: Internal Medicine

## 2012-11-21 VITALS — BP 114/68 | HR 92 | Ht 63.0 in | Wt 128.2 lb

## 2012-11-21 DIAGNOSIS — F17200 Nicotine dependence, unspecified, uncomplicated: Secondary | ICD-10-CM

## 2012-11-21 DIAGNOSIS — C3490 Malignant neoplasm of unspecified part of unspecified bronchus or lung: Secondary | ICD-10-CM

## 2012-11-21 DIAGNOSIS — J449 Chronic obstructive pulmonary disease, unspecified: Secondary | ICD-10-CM

## 2012-11-21 NOTE — Patient Instructions (Addendum)
Please keep trying to stop smoking!  We can continue Oxygen 5L/ [facility]  Please cal as needed

## 2012-11-21 NOTE — Progress Notes (Signed)
Patient ID: [name], female    DOB: [birth year], 62 y.o.   MRN: [id number]  HPI  12/09/10- 62 yoF smoker followed for COPD, tobacco abuse and lung nodule, still smoking. Complicating hx of extensive neurofibromatosis, depression, osteopenia. Last here August 11, 2010 after hosp twice for exacerbations of COPD, unable to afford meds. Oxygen dependent. During hosp CXR showed 1.4x1.0 LUL nodule.  Since last here, she had exacerbation with dyspnea while visiting sister in [place]. They admitted her to [facility] again 2-3 weeks ago- breathing better, but upped O2 to 5 L. They did needle bx of the lung nodule and told her POS for CANCER. She knows no more than that with no f/u planned at [facility].We are requesting records. She wants care here where she lives w/ husband. Very anxious- asks help for nerves- has taken xanax 1 mg in past w/o oversedation. Feels ok. Denies cough, phlegm, blood, pain or swelling. Easy DOE across room. Feet not swelling.   12/30/10- 62 yoF smoker followed for COPD, tobacco abuse , still smoking. Complicating hx of extensive neurofibromatosis, depression, osteopenia. Adenoca LUL/ XRT,  CAD, neurofibromatosis, meningioma Since last here, cytopath from [facility] confirmed needle lung bx adenoca. PET 13 suv w/ no mets. CT brain showed stable meningioma. We reviewed discussion at [facility] where recognition that this lesion is on major fissure means too much lung resection for her COPD. Instead we will refer for XRT. She is trying to quit smoking. Denies chest pain. Productive cough with light brownish sputum is routine for her. Denies blood, fever, nodes or breast nodule.  Asks help for sleep, saying that xanax isn't helping.   06/13/11-  62 yoF smoker followed for COPD, tobacco abuse , still smoking. Complicating hx of extensive neurofibromatosis, depression, osteopenia. 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 Has had flu vaccine. Still smoking about 10 cigarettes a day and we discussed ways to quit including transfer to electronic cigarettes. She remains on continuous oxygen 4 L per minute/Advanced. More short of breath in the last week and a half, coughing some green or yellow sputum and feeling heavy through the chest. Denies fever, sore throat, palpitations, edema, blood. Had a MRSA abscess left axilla surgically drained. Completed antibiotics 2 weeks ago. Home health nurses packing it. We called in a prednisone taper last night for bronchitic exacerbation.  10/10/11-  62 yoF smoker followed for COPD, tobacco abuse , still smoking. Complicating hx of extensive neurofibromatosis, depression, osteopenia. Adenoca LUL/ XRT,  CAD, meningioma,  CAD, She is interested in trying again to quit smoking. Failed patches and electronic cigarettes. History of depression makes Chantix a poor choice. Home oxygen concentrator set at 5/ oxygen saturation usually 98% at home.. Coughs a little, nonproductive. Denies chest pain, blood or purulent sputum. Taking no pulmonary meds but does have a nebulizer.. CT chest 08/14/11- reviewed with her: Plan f/u CT this summer. IMPRESSION:  1. Further reduction in conspicuity of the left upper lobe nodule.  2. Cutaneous findings of neurofibromatosis.  3. Severe emphysema.  4. Stable small hepatic and splenic lesions are probably benign  but technically nonspecific; these are not associated with this  observable hypermetabolic activity on recent PET CT.  Original Report Authenticated By: Dellia Cloud, M.D.    11/06/11- 62 yoF smoker followed for COPD, tobacco abuse , still smoking. Complicating hx of extensive neurofibromatosis, depression, osteopenia. Adenoca LUL/ XRT,  CAD, meningioma,  CA Post hospital-pt was shown how to use Advair HFA, Spiriva, and albuterol neb tx today; However, patient is unable to use Tudorza-has a hard time breathing deep enough to  get medication out of inhaler  Hospitalized at [facility] March 12 through March 30 on teaching service for exacerbation of COPD. Has reduced smoking to 4 cigarettes a day but finds it very hard going. Has not found nicotine substitutes useful. We discussed other measures. Husband smokes at home. Complains of "lungs hurt" indicating lower anterior bilateral chest. Asks pain medication. Had been taking Percocet but ran out out pending return to her primary office followup. Discussed history of substance abuse. She states she wants DO NOT RESUSCITATE/DO NOT INTUBATE status "no matter what my family thinks". Wears oxygen at 5 L to address sense of shortness of breath which is at least partly anxiety. Chest x-ray 09/01/2011-COPD, spiculated left upper lobe nodule 1 cm/in area of prior XRT, mild bilateral opacities.  03/07/12- 62 yoF smoker followed for COPD, tobacco abuse , still smoking. Complicating hx of extensive neurofibromatosis, depression, osteopenia. Adenoca LUL/ XRT,  CAD, meningioma,  CA. (DNR/DNI 11/06/11). Breathing(SOB) getting worse; stays in bed all the time; K+ stays too high . COPD assessment test (CAT) scored 19/40 Her physician did lab work and told her potassium was too high-for dietary management. She stays in a hospital bed much of each day watching TV. Remains on oxygen 5 L/Advanced. Mentions spinal cord stimulator for pain CT chest 02/29/12-images reviewed with her IMPRESSION:  1. Stable CT of the chest. No new or progressive disease  identified.  2. Emphysema  3. Prominent coronary artery calcifications.   08/19/12-  62 yoF smoker followed for COPD, tobacco abuse , still smoking. Complicating hx of extensive neurofibromatosis, depression, osteopenia. Adenoca LUL/ hx XRT,  CAD, meningioma,  CA. (DNR/DNI 11/06/11). FOLLOWS FOR: recent hospital  stay(passed out and COPD) states breathing has gotten worse since last OV and since being out of the hospital. Says she "lied" and told them at  hospital she was better so that would let her home for Christmas. Continues oxygen 5 L/Advanced. She still smokes about 10 cigarettes a day and says her primary physician recommended against Chantix for her. Followup radiation oncology next month watching left upper lobe nodule. CT chest 07/28/12 IMPRESSION:  Left upper lobe nodular opacity is mildly increased in size, mild  increased surrounding linear opacity. This is nonspecific.  Recommend short-term (2-3 month) follow-up.  Emphysema.  Other findings similar to prior as above.  Original Report Authenticated By: Jearld Lesch, M.D.   11/21/12- 82 yoF smoker followed for COPD, tobacco abuse , still smoking. Complicating hx of extensive neurofibromatosis, depression, osteopenia. Adenoca LUL/ hx XRT,  CAD, meningioma,  . (DNR/DNI 11/06/11). FOLLOWS FOR: CT chest done 4/14-- pt states breathing is about the same, c/o prod cough w thick greenish yellow mucus occuring throughout entire day Chronic cough a little worse recently and sputum seems greener. Smoking about 10 cigarettes a day despite counseling. We reviewed this issue again. O2 5L/ Advanced CT chest 11/18/12 IMPRESSION:  Increased focal pleural thickening without measurable mass in the  left upper lobe which is nonspecific and could represent scarring  or potentially post radiation change if applicable. Plexiform  neurofibroma would be less likely but possible. This is amenable  to follow-up presumed future interval restaging studies.  Stable multiple lower thoracic/upper lumbar compression  deformities.  Original Report Authenticated By: Christiana Pellant, M.D.  Review of Systems-See HPI Constitutional:   No-  weight loss, night sweats, fevers, chills, fatigue, lassitude. HEENT:   No-  headaches, difficulty swallowing, tooth/dental problems, sore throat,       No-  sneezing, itching, ear ache, nasal congestion, post nasal drip,  CV:  No- anginal  chest pain, orthopnea, PND,  swelling in lower extremities, anasarca,  dizziness, palpitations Resp: +baseline shortness of breath with exertion or at rest.              +Some  productive cough,  No non-productive cough,  No- coughing up of blood.              +  change in color of mucus.  No- wheezing.   Skin: No change in fibromatosis GI:  No-   heartburn, indigestion, abdominal pain, nausea, vomiting,  GU:  MS:  No-   joint pain or swelling.  Neuro-     nothing unusual Psych:  No- change in mood or affect. No depression or anxiety.  No memory loss.  Objective:   Physical Exam  General- Alert, Oriented, Affect-appropriate, Distress- none acute; portable oxygen at 5 L, talkative . Odor of tobacco Skin- +neurofibromatosis  Lymphadenopathy- none Head- atraumatic            Eyes- Gross vision intact, PERRLA, conjunctivae clear secretions            Ears- Hearing, canals-normal            Nose- Clear, no-Septal dev, mucus, polyps, erosion, perforation             Throat- Mallampati II , mucosa clear , drainage- none, tonsils- atrophic Neck- flexible , trachea midline, no stridor , thyroid nl, carotid no bruit Chest - symmetrical excursion , unlabored           Heart/CV- RRR , no murmur , no gallop  , no rub, nl s1 s2                           - JVD- none , edema- none, stasis changes- none, varices- none           Lung- clear to P&A but quite distant, wheeze- none, cough+loose , dullness-none, rub- none           Chest wall-  Abd-  Br/ Gen/ Rectal- Not done, not indicated Extrem- cyanosis- none, clubbing, none, atrophy- none, strength- nl Neuro- grossly intact to observation

## 2012-11-27 ENCOUNTER — Other Ambulatory Visit: Payer: Self-pay | Admitting: *Deleted

## 2012-11-27 MED ORDER — MIRTAZAPINE 15 MG PO TABS: ORAL_TABLET | ORAL | Status: DC

## 2012-11-28 NOTE — Assessment & Plan Note (Signed)
Significant COPD but she has not been able to quit smoking despite counseling. We discussed medications. Encourage walking for endurance. Discussed her oxygen.

## 2012-11-28 NOTE — Assessment & Plan Note (Signed)
Residual left upper lobe density on CT is consistent with radiation change. Need to watch for active process including possible recurrence of cancer, new neoplasm, possibly related to her neurofibromatosis. Plan-chest x-ray in 3 months, probably repeat CT scan in a year. She will followup with Dr Michell Heinrich.

## 2012-11-28 NOTE — Progress Notes (Signed)
Quick Note:  Pt aware of results. ______ 

## 2012-11-28 NOTE — Assessment & Plan Note (Signed)
Counseling today on support for quitting and reasons she continued to smoke.

## 2012-12-05 ENCOUNTER — Encounter: Payer: Self-pay | Admitting: Internal Medicine

## 2012-12-05 ENCOUNTER — Ambulatory Visit (INDEPENDENT_AMBULATORY_CARE_PROVIDER_SITE_OTHER): Payer: Medicare Other | Admitting: Internal Medicine

## 2012-12-05 VITALS — BP 132/74 | HR 99 | Temp 98.2°F | Resp 14 | Ht 63.0 in | Wt 124.2 lb

## 2012-12-05 DIAGNOSIS — F411 Generalized anxiety disorder: Secondary | ICD-10-CM

## 2012-12-05 DIAGNOSIS — F17200 Nicotine dependence, unspecified, uncomplicated: Secondary | ICD-10-CM

## 2012-12-05 DIAGNOSIS — F329 Major depressive disorder, single episode, unspecified: Secondary | ICD-10-CM

## 2012-12-05 DIAGNOSIS — M545 Low back pain, unspecified: Secondary | ICD-10-CM

## 2012-12-05 DIAGNOSIS — F3289 Other specified depressive episodes: Secondary | ICD-10-CM

## 2012-12-05 DIAGNOSIS — J449 Chronic obstructive pulmonary disease, unspecified: Secondary | ICD-10-CM

## 2012-12-05 DIAGNOSIS — G4700 Insomnia, unspecified: Secondary | ICD-10-CM

## 2012-12-05 DIAGNOSIS — I10 Essential (primary) hypertension: Secondary | ICD-10-CM

## 2012-12-05 DIAGNOSIS — R51 Headache: Secondary | ICD-10-CM

## 2012-12-05 DIAGNOSIS — E785 Hyperlipidemia, unspecified: Secondary | ICD-10-CM

## 2012-12-05 DIAGNOSIS — C3490 Malignant neoplasm of unspecified part of unspecified bronchus or lung: Secondary | ICD-10-CM

## 2012-12-05 DIAGNOSIS — J4489 Other specified chronic obstructive pulmonary disease: Secondary | ICD-10-CM

## 2012-12-05 MED ORDER — ALPRAZOLAM 0.5 MG PO TABS: ORAL_TABLET | ORAL | Status: DC

## 2012-12-05 MED ORDER — OXYCODONE-ACETAMINOPHEN 7.5-325 MG PO TABS: 1.0000 | ORAL_TABLET | Freq: Four times a day (QID) | ORAL | Status: DC | PRN

## 2012-12-05 MED ORDER — QUETIAPINE FUMARATE 200 MG PO TABS: 400.0000 mg | ORAL_TABLET | Freq: Every day | ORAL | Status: DC

## 2012-12-05 NOTE — Assessment & Plan Note (Addendum)
Stable at this point.  Uses chronic oxygen.  Dyspneic going room to room.  Continues to function independently.  Smoking less.
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
## 2012-12-07 ENCOUNTER — Encounter: Payer: Self-pay | Admitting: Internal Medicine

## 2012-12-07 DIAGNOSIS — M545 Low back pain, unspecified: Secondary | ICD-10-CM | POA: Insufficient documentation

## 2012-12-07 DIAGNOSIS — E785 Hyperlipidemia, unspecified: Secondary | ICD-10-CM | POA: Insufficient documentation

## 2012-12-07 DIAGNOSIS — G4700 Insomnia, unspecified: Secondary | ICD-10-CM | POA: Insufficient documentation

## 2012-12-07 NOTE — Assessment & Plan Note (Addendum)
F/u lipids before next visit.  Is on low dose lipitor 10mg  at this point.  Transaminases have been ok recently.

## 2012-12-07 NOTE — Assessment & Plan Note (Signed)
Chronic.  Continue percocet from this office only.

## 2012-12-07 NOTE — Assessment & Plan Note (Signed)
Most recent CT by Dr. Maple Hudson shows no nodule, only scarring vs. Radiation changes.

## 2012-12-07 NOTE — Assessment & Plan Note (Signed)
Increase seroquel to 400mg  po qhs.  She's been taking 300mg  recently with some benefit.  Monitor for sedation.

## 2012-12-07 NOTE — Assessment & Plan Note (Signed)
Has cut back on her smoking, but says she does not see a reason to quit when it will take a couple of years for her to notice any effect and she'll be dead by then.

## 2012-12-07 NOTE — Assessment & Plan Note (Signed)
BP is at goal today.  No changes were necessary.

## 2012-12-07 NOTE — Assessment & Plan Note (Signed)
Using prn xanax and is on remeron for mood, appetite and sleep.  Has little effect on her sleep.  Requires high doses of seroquel for that.

## 2012-12-07 NOTE — Assessment & Plan Note (Signed)
Suspect this is due to her chronic use of excedrin migraine.  ? Any relation to her meningioma (this has not changed in size for years).  These also seem related to her difficulty sleeping.

## 2012-12-07 NOTE — Assessment & Plan Note (Signed)
No recent flare-ups of depression.  On mirtazapine at bedtime alone with seroquel.
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
## 2012-12-16 ENCOUNTER — Other Ambulatory Visit: Payer: Self-pay | Admitting: *Deleted

## 2012-12-16 MED ORDER — MIRTAZAPINE 15 MG PO TABS: ORAL_TABLET | ORAL | Status: DC

## 2012-12-20 ENCOUNTER — Telehealth: Payer: Self-pay | Admitting: Internal Medicine

## 2012-12-20 NOTE — Telephone Encounter (Signed)
Pt missed her lab appt for cbc, bmp.  Please reschedule asap.  Was to check potassium and creatinine.

## 2012-12-20 NOTE — Progress Notes (Signed)
Please inform Nate that she missed her lab appt for her cbc and bmp and she should reschedule.

## 2012-12-24 NOTE — Telephone Encounter (Signed)
Spoke with patient and appointment made for 12/26/2012 for labs

## 2012-12-26 ENCOUNTER — Other Ambulatory Visit: Payer: Self-pay | Admitting: Geriatric Medicine

## 2012-12-26 ENCOUNTER — Other Ambulatory Visit: Payer: Medicare Other

## 2012-12-26 DIAGNOSIS — D649 Anemia, unspecified: Secondary | ICD-10-CM

## 2012-12-26 DIAGNOSIS — E785 Hyperlipidemia, unspecified: Secondary | ICD-10-CM

## 2012-12-26 DIAGNOSIS — N189 Chronic kidney disease, unspecified: Secondary | ICD-10-CM

## 2012-12-27 LAB — BASIC METABOLIC PANEL
BUN/Creatinine Ratio: 12 (ref 11–26)
BUN: 10 mg/dL (ref 8–27)
CO2: 24 mmol/L (ref 19–28)
Calcium: 9.5 mg/dL (ref 8.6–10.2)
Chloride: 107 mmol/L (ref 97–108)
Creatinine, Ser: 0.84 mg/dL (ref 0.57–1.00)
GFR calc Af Amer: 85 mL/min/{1.73_m2} (ref >59)
GFR calc non Af Amer: 74 mL/min/{1.73_m2} (ref >59)
Glucose: 100 mg/dL — ABNORMAL HIGH (ref 65–99)
Potassium: 4.2 mmol/L (ref 3.5–5.2)
Sodium: 143 mmol/L (ref 134–144)

## 2012-12-27 LAB — CBC WITH DIFFERENTIAL/PLATELET
Basophils Absolute: 0 10*3/uL (ref 0.0–0.2)
Basos: 0 % (ref 0–3)
Eos: 0 % (ref 0–5)
Eosinophils Absolute: 0 10*3/uL (ref 0.0–0.4)
HCT: 36.8 % (ref 34.0–46.6)
Hemoglobin: 12.4 g/dL (ref 11.1–15.9)
Immature Grans (Abs): 0 10*3/uL (ref 0.0–0.1)
Immature Granulocytes: 0 % (ref 0–2)
Lymphocytes Absolute: 0.7 10*3/uL (ref 0.7–3.1)
Lymphs: 10 % — ABNORMAL LOW (ref 14–46)
MCH: 31.2 pg (ref 26.6–33.0)
MCHC: 33.7 g/dL (ref 31.5–35.7)
MCV: 93 fL (ref 79–97)
Monocytes Absolute: 0.4 10*3/uL (ref 0.1–0.9)
Monocytes: 6 % (ref 4–12)
Neutrophils Absolute: 5.5 10*3/uL (ref 1.4–7.0)
Neutrophils Relative %: 84 % — ABNORMAL HIGH (ref 40–74)
RBC: 3.97 x10E6/uL (ref 3.77–5.28)
RDW: 14 % (ref 12.3–15.4)
WBC: 6.6 10*3/uL (ref 3.4–10.8)

## 2012-12-27 LAB — LIPID PANEL
Chol/HDL Ratio: 5 ratio units — ABNORMAL HIGH (ref 0.0–4.4)
Cholesterol, Total: 214 mg/dL — ABNORMAL HIGH (ref 100–199)
HDL: 43 mg/dL (ref >39)
LDL Calculated: 131 mg/dL — ABNORMAL HIGH (ref 0–99)
Triglycerides: 199 mg/dL — ABNORMAL HIGH (ref 0–149)
VLDL Cholesterol Cal: 40 mg/dL (ref 5–40)

## 2012-12-31 ENCOUNTER — Other Ambulatory Visit: Payer: Self-pay | Admitting: Internal Medicine

## 2013-01-02 ENCOUNTER — Other Ambulatory Visit: Payer: Self-pay | Admitting: Geriatric Medicine

## 2013-01-02 DIAGNOSIS — F411 Generalized anxiety disorder: Secondary | ICD-10-CM

## 2013-01-02 DIAGNOSIS — M545 Low back pain: Secondary | ICD-10-CM

## 2013-01-02 MED ORDER — OXYCODONE-ACETAMINOPHEN 7.5-325 MG PO TABS: 1.0000 | ORAL_TABLET | Freq: Four times a day (QID) | ORAL | Status: DC | PRN

## 2013-01-02 MED ORDER — ALPRAZOLAM 0.5 MG PO TABS: ORAL_TABLET | ORAL | Status: DC

## 2013-01-06 ENCOUNTER — Other Ambulatory Visit: Payer: Self-pay | Admitting: Geriatric Medicine

## 2013-01-06 DIAGNOSIS — G4700 Insomnia, unspecified: Secondary | ICD-10-CM

## 2013-01-14 ENCOUNTER — Telehealth: Payer: Self-pay | Admitting: *Deleted

## 2013-01-14 ENCOUNTER — Other Ambulatory Visit: Payer: Self-pay | Admitting: *Deleted

## 2013-01-14 DIAGNOSIS — F411 Generalized anxiety disorder: Secondary | ICD-10-CM

## 2013-01-14 DIAGNOSIS — M545 Low back pain: Secondary | ICD-10-CM

## 2013-01-14 MED ORDER — OXYCODONE-ACETAMINOPHEN 7.5-325 MG PO TABS: 1.0000 | ORAL_TABLET | Freq: Four times a day (QID) | ORAL | Status: DC | PRN

## 2013-01-14 MED ORDER — ALPRAZOLAM 0.5 MG PO TABS: ORAL_TABLET | ORAL | Status: DC
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
## 2013-01-27 ENCOUNTER — Encounter (HOSPITAL_COMMUNITY): Payer: Self-pay | Admitting: Emergency Medicine

## 2013-01-27 ENCOUNTER — Inpatient Hospital Stay (HOSPITAL_COMMUNITY)
Admission: EM | Admit: 2013-01-27 | Discharge: 2013-01-29 | DRG: 191 | Disposition: A | Discharge status: Home / Self Care | Payer: Medicare Other | Attending: Internal Medicine | Admitting: Internal Medicine

## 2013-01-27 ENCOUNTER — Emergency Department (HOSPITAL_COMMUNITY): Payer: Medicare Other

## 2013-01-27 DIAGNOSIS — L02412 Cutaneous abscess of left axilla: Secondary | ICD-10-CM

## 2013-01-27 DIAGNOSIS — I5040 Unspecified combined systolic (congestive) and diastolic (congestive) heart failure: Secondary | ICD-10-CM

## 2013-01-27 DIAGNOSIS — C3410 Malignant neoplasm of upper lobe, unspecified bronchus or lung: Secondary | ICD-10-CM | POA: Diagnosis present

## 2013-01-27 DIAGNOSIS — G4700 Insomnia, unspecified: Secondary | ICD-10-CM

## 2013-01-27 DIAGNOSIS — I509 Heart failure, unspecified: Secondary | ICD-10-CM

## 2013-01-27 DIAGNOSIS — E875 Hyperkalemia: Secondary | ICD-10-CM

## 2013-01-27 DIAGNOSIS — J9620 Acute and chronic respiratory failure, unspecified whether with hypoxia or hypercapnia: Secondary | ICD-10-CM

## 2013-01-27 DIAGNOSIS — Q8500 Neurofibromatosis, unspecified: Secondary | ICD-10-CM | POA: Diagnosis present

## 2013-01-27 DIAGNOSIS — I10 Essential (primary) hypertension: Secondary | ICD-10-CM | POA: Diagnosis present

## 2013-01-27 DIAGNOSIS — D126 Benign neoplasm of colon, unspecified: Secondary | ICD-10-CM

## 2013-01-27 DIAGNOSIS — R0902 Hypoxemia: Secondary | ICD-10-CM | POA: Diagnosis present

## 2013-01-27 DIAGNOSIS — Z9981 Dependence on supplemental oxygen: Secondary | ICD-10-CM

## 2013-01-27 DIAGNOSIS — R51 Headache: Secondary | ICD-10-CM

## 2013-01-27 DIAGNOSIS — M545 Low back pain, unspecified: Secondary | ICD-10-CM | POA: Diagnosis present

## 2013-01-27 DIAGNOSIS — R12 Heartburn: Secondary | ICD-10-CM | POA: Diagnosis present

## 2013-01-27 DIAGNOSIS — F1920 Other psychoactive substance dependence, uncomplicated: Secondary | ICD-10-CM

## 2013-01-27 DIAGNOSIS — Z862 Personal history of diseases of the blood and blood-forming organs and certain disorders involving the immune mechanism: Secondary | ICD-10-CM

## 2013-01-27 DIAGNOSIS — I739 Peripheral vascular disease, unspecified: Secondary | ICD-10-CM

## 2013-01-27 DIAGNOSIS — F329 Major depressive disorder, single episode, unspecified: Secondary | ICD-10-CM | POA: Diagnosis present

## 2013-01-27 DIAGNOSIS — F17200 Nicotine dependence, unspecified, uncomplicated: Secondary | ICD-10-CM | POA: Diagnosis present

## 2013-01-27 DIAGNOSIS — Z66 Do not resuscitate: Secondary | ICD-10-CM | POA: Diagnosis present

## 2013-01-27 DIAGNOSIS — E785 Hyperlipidemia, unspecified: Secondary | ICD-10-CM

## 2013-01-27 DIAGNOSIS — Z923 Personal history of irradiation: Secondary | ICD-10-CM

## 2013-01-27 DIAGNOSIS — J4489 Other specified chronic obstructive pulmonary disease: Secondary | ICD-10-CM | POA: Diagnosis present

## 2013-01-27 DIAGNOSIS — F411 Generalized anxiety disorder: Secondary | ICD-10-CM | POA: Diagnosis present

## 2013-01-27 DIAGNOSIS — F1120 Opioid dependence, uncomplicated: Secondary | ICD-10-CM | POA: Diagnosis present

## 2013-01-27 DIAGNOSIS — F1910 Other psychoactive substance abuse, uncomplicated: Secondary | ICD-10-CM | POA: Diagnosis present

## 2013-01-27 DIAGNOSIS — J441 Chronic obstructive pulmonary disease with (acute) exacerbation: Principal | ICD-10-CM

## 2013-01-27 DIAGNOSIS — F3289 Other specified depressive episodes: Secondary | ICD-10-CM | POA: Diagnosis present

## 2013-01-27 DIAGNOSIS — R55 Syncope and collapse: Secondary | ICD-10-CM

## 2013-01-27 DIAGNOSIS — Z79899 Other long term (current) drug therapy: Secondary | ICD-10-CM

## 2013-01-27 DIAGNOSIS — J449 Chronic obstructive pulmonary disease, unspecified: Secondary | ICD-10-CM | POA: Diagnosis present

## 2013-01-27 LAB — CBC WITH DIFFERENTIAL/PLATELET
Lymphocytes Relative: 21 % (ref 12–46)
Lymphs Abs: 1.3 10*3/uL (ref 0.7–4.0)
MCV: 91.4 fL (ref 78.0–100.0)
Neutrophils Relative %: 67 % (ref 43–77)
Platelets: 239 10*3/uL (ref 150–400)
RBC: 3.97 MIL/uL (ref 3.87–5.11)
WBC: 6.2 10*3/uL (ref 4.0–10.5)

## 2013-01-27 LAB — BASIC METABOLIC PANEL
CO2: 25 mEq/L (ref 19–32)
Glucose, Bld: 109 mg/dL — ABNORMAL HIGH (ref 70–99)
Potassium: 4.4 mEq/L (ref 3.5–5.1)
Sodium: 140 mEq/L (ref 135–145)

## 2013-01-27 MED ORDER — ALBUTEROL SULFATE (5 MG/ML) 0.5% IN NEBU: 2.5000 mg | INHALATION_SOLUTION | RESPIRATORY_TRACT | Status: DC | PRN

## 2013-01-27 MED ORDER — METHYLPREDNISOLONE SODIUM SUCC 125 MG IJ SOLR
125.0000 mg | Freq: Once | INTRAMUSCULAR | Status: AC
  Administered 2013-01-27: 125 mg via INTRAVENOUS
  Filled 2013-01-27: qty 2

## 2013-01-27 MED ORDER — ALBUTEROL (5 MG/ML) CONTINUOUS INHALATION SOLN
10.0000 mg/h | INHALATION_SOLUTION | Freq: Once | RESPIRATORY_TRACT | Status: AC
  Administered 2013-01-27: 10 mg/h via RESPIRATORY_TRACT
  Filled 2013-01-27: qty 20

## 2013-01-27 MED ORDER — ALBUTEROL SULFATE (5 MG/ML) 0.5% IN NEBU
2.5000 mg | INHALATION_SOLUTION | Freq: Four times a day (QID) | RESPIRATORY_TRACT | Status: DC
  Administered 2013-01-28 (×3): 2.5 mg via RESPIRATORY_TRACT
  Filled 2013-01-27 (×3): qty 0.5

## 2013-01-27 MED ORDER — IPRATROPIUM BROMIDE 0.02 % IN SOLN
0.5000 mg | Freq: Once | RESPIRATORY_TRACT | Status: AC
  Administered 2013-01-27: 0.5 mg via RESPIRATORY_TRACT
  Filled 2013-01-27: qty 2.5

## 2013-01-27 MED ORDER — DOXYCYCLINE HYCLATE 100 MG IV SOLR
100.0000 mg | Freq: Once | INTRAVENOUS | Status: AC
  Administered 2013-01-27: 100 mg via INTRAVENOUS
  Filled 2013-01-27: qty 100

## 2013-01-27 NOTE — ED Provider Notes (Signed)
History    CSN: [id number] Arrival date & time 01/27/13  1816  First MD Initiated Contact with Patient 01/27/13 1847     Chief Complaint  Patient presents with  . Shortness of Breath   (Consider location/radiation/quality/duration/timing/severity/associated sxs/prior Treatment) HPI Comments: [name] is an 62 y.o. female with hx of severe neurofibromatosis, severe COPD on home oxygen at 5 liters [place], continued tobacco abuse, anxiety, hx of lung CA, hx of small pulmonary nodule (Dr Maple Hudson), chronic back pain on chronic narcotic use, presents to the ER with 3 days hx of increase DOE, green sputum productive coughs. She has not been able to use the nebulizer at home because she couldn't find it. No fevers, chills. Pt does have some chest pain, it is midsternal and is worse with inspiration. Has no PE, DVT hx.  Patient is a 62 y.o. female presenting with shortness of breath. The history is provided by the patient and medical records.  Shortness of Breath Associated symptoms: cough and wheezing   Associated symptoms: no abdominal pain, no chest pain, no neck pain and no vomiting    Past Medical History  Diagnosis Date  . DJD (degenerative joint disease)   . COPD (chronic obstructive pulmonary disease)   . Tobacco dependence 2000    low back  . Avascular necrosis   . Osteoporosis   . Substance abuse   . Neurofibromatosis   . Hyperplastic colonic polyp   . CHF (congestive heart failure)   . Abnormal weight loss   . Essential hypertension, benign   . Depressive disorder, not elsewhere classified   . Constipation   . Low back pain   . Anxiety   . Lung cancer 2012    LUL  . Shortness of breath   . Traumatic pneumothorax     history of  . History of radiation therapy 02/06/11 -02/17/11    LUL   Past Surgical History  Procedure Laterality Date  . Laminectomy  02/2003  . Back surgery      multiple  . Tubal ligation    . Vesicovaginal fistula closure w/ tah    .  Cholecystectomy    . Tonsilectomy, adenoidectomy, bilateral myringotomy and tubes    . Hip surgery      right hip, metal plate   Family History  Problem Relation Age of Onset  . Lung cancer Father   . Emphysema Mother   . Neurofibromatosis Mother   . Liver disease     History  Substance Use Topics  . Smoking status: Current Some Day Smoker -- 1.00 packs/day for 35 years    Types: Cigarettes  . Smokeless tobacco: Never Used  . Alcohol Use: No   OB History   Grav Para Term Preterm Abortions TAB SAB Ect Mult Living                 Review of Systems  Constitutional: Negative for activity change.  HENT: Negative for facial swelling and neck pain.   Respiratory: Positive for cough, shortness of breath and wheezing.   Cardiovascular: Negative for chest pain.  Gastrointestinal: Negative for nausea, vomiting, abdominal pain, diarrhea, constipation, blood in stool and abdominal distention.  Genitourinary: Negative for hematuria and difficulty urinating.  Skin: Negative for color change.  Neurological: Negative for speech difficulty.  Hematological: Does not bruise/bleed easily.  Psychiatric/Behavioral: Negative for confusion.    Allergies  Penicillins  Home Medications   Current Outpatient Rx  Name  Route  Sig  Dispense  Refill  . ALPRAZolam (XANAX) 0.5 MG tablet      Take one tablet by mouth every 6 hours as needed for anxiety.   120 tablet   3   . atorvastatin (LIPITOR) 10 MG tablet      TAKE 1 TABLET BY MOUTH AT BEDTIME FOR CHOLESTEROL   30 tablet   0   . lisinopril (PRINIVIL,ZESTRIL) 40 MG tablet   Oral   Take 40 mg by mouth every evening.          . mirtazapine (REMERON) 15 MG tablet      Take one tablet by mouth at bedtime for rest   30 tablet   3   . oxyCODONE-acetaminophen (PERCOCET) 7.5-325 MG per tablet   Oral   Take 1 tablet by mouth every 6 (six) hours as needed for pain. For pain   120 tablet   0   . QUEtiapine (SEROQUEL) 200 MG tablet    Oral   Take 2 tablets (400 mg total) by mouth at bedtime.   30 tablet   3   . Aspirin-Acetaminophen-Caffeine (EXCEDRIN PO)   Oral   Take 2 tablets by mouth every 6 (six) hours as needed. For headache          BP 114/98  Pulse 88  Temp(Src) 98.3 F (36.8 C) (Oral)  Resp 20  SpO2 98% Physical Exam  Nursing note and vitals reviewed. Constitutional: She is oriented to person, place, and time. She appears well-developed and well-nourished.  HENT:  Head: Normocephalic and atraumatic.  Eyes: EOM are normal. Pupils are equal, round, and reactive to light.  Neck: Neck supple. No JVD present.  Cardiovascular: Normal rate, regular rhythm and normal heart sounds.   No murmur heard. Pulmonary/Chest: No respiratory distress. She has wheezes.  Abdominal: Soft. She exhibits no distension. There is no tenderness. There is no rebound and no guarding.  Neurological: She is alert and oriented to person, place, and time.  Skin: Skin is warm and dry.    ED Course  Procedures (including critical care time) Labs Reviewed  CBC WITH DIFFERENTIAL  BASIC METABOLIC PANEL  TROPONIN I   No results found. No diagnosis found.  MDM   Date: 01/28/2013  Rate: 86  Rhythm: normal sinus rhythm  QRS Axis: normal  Intervals: normal  ST/T Wave abnormalities: normal  Conduction Disutrbances: none  Narrative Interpretation: unremarkable  Pt comes in with cc of DIB. Clinical exam consistent with COPD exacerbation. Has hx of lung nodule, Small cell CA? - so PE is also on the ddx - but with the clinical exam finding of wheezing - likely to be COPD exacerbation.  We will give hour long treatment and reassess.   LATE ENTRY: POST 1 hour albuterol, patient had wheezing still - so we gave 1 more hour treatment. She didn't feel markedly better. Given hx of severe COPD - decision to admit was made. Doxy given, solumedrol given.  CRITICAL CARE Performed by: Derwood Kaplan   Total critical care time:  35 minutes  Critical care time was exclusive of separately billable procedures and treating other patients.  Critical care was necessary to treat or prevent imminent or life-threatening deterioration.  Critical care was time spent personally by me on the following activities: development of treatment plan with patient and/or surrogate as well as nursing, discussions with consultants, evaluation of patient's response to treatment, examination of patient, obtaining history from patient or surrogate, ordering and performing treatments and interventions, ordering and review of laboratory  studies, ordering and review of radiographic studies, pulse oximetry and re-evaluation of patient's condition.     Derwood Kaplan, MD 01/28/13 0025

## 2013-01-27 NOTE — H&P (Signed)
[facility] History and Physical  [name] XBJ:[id number] DOB: 09-11-1950    PCP:   Bufford Spikes, DO   Chief Complaint: shortness of breath and green sputum cough for 3 days.  HPI: [name] is an 62 y.o. female with hx of severe neurofibromatosis, severe COPD on home oxygen at 5 liters [place], continued tobacco abuse, anxiety, hx of lung CA, hx of small pulmonary nodule (Dr Maple Hudson), chronic back pain on chronic narcotic use, presents to the ER with 3 days hx of increase DOE, green sputum productive coughs, but no chest pain.  She has not been able to use the nebulizer at home because she couldn't find it.  In the ER, she was given IV solumedrol, nebulizer tx including an hour neb, and IV Doxycycline.  She continued to have wheezing, so hosptitalist was asked to admit her for COPD exacerbation.  Her other work up included a CXR which showed COPD, but no infiltrate, normal serology, and negative cardiac markers.  Her initial EKG showed NSR with no acute ST-T changes.  Rewiew of Systems:  Constitutional: Negative for malaise, fever and chills. No significant weight loss or weight gain Eyes: Negative for eye pain, redness and discharge, diplopia, visual changes, or flashes of light. ENMT: Negative for ear pain, hoarseness, nasal congestion, sinus pressure and sore throat. No headaches; tinnitus, drooling, or problem swallowing. Cardiovascular: Negative for chest pain, palpitations, diaphoresis,  and peripheral edema. ; No orthopnea, PND Respiratory: Negative for stridor. No pleuritic chestpain. Gastrointestinal: Negative for nausea, vomiting, diarrhea, constipation, abdominal pain, melena, blood in stool, hematemesis, jaundice and rectal bleeding.    Genitourinary: Negative for frequency, dysuria, incontinence,flank pain and hematuria; Musculoskeletal:  Negative for swelling and trauma.;  Skin: . Negative for pruritus, rash, abrasions, bruising and skin lesion.; ulcerations Neuro:  Negative for headache, lightheadedness and neck stiffness. Negative for weakness, altered level of consciousness , altered mental status, extremity weakness, burning feet, involuntary movement, seizure and syncope.  Psych: negative for insomnia, tearfulness, panic attacks, hallucinations, paranoia, suicidal or homicidal ideation    Past Medical History  Diagnosis Date  . DJD (degenerative joint disease)   . COPD (chronic obstructive pulmonary disease)   . Tobacco dependence 2000    low back  . Avascular necrosis   . Osteoporosis   . Substance abuse   . Neurofibromatosis   . Hyperplastic colonic polyp   . CHF (congestive heart failure)   . Abnormal weight loss   . Essential hypertension, benign   . Depressive disorder, not elsewhere classified   . Constipation   . Low back pain   . Anxiety   . Lung cancer 2012    LUL  . Shortness of breath   . Traumatic pneumothorax     history of  . History of radiation therapy 02/06/11 -02/17/11    LUL    Past Surgical History  Procedure Laterality Date  . Laminectomy  02/2003  . Back surgery      multiple  . Tubal ligation    . Vesicovaginal fistula closure w/ tah    . Cholecystectomy    . Tonsilectomy, adenoidectomy, bilateral myringotomy and tubes    . Hip surgery      right hip, metal plate    Medications:  HOME MEDS: Prior to Admission medications   Medication Sig Start Date End Date Taking? Authorizing Provider  ALPRAZolam Prudy Feeler) 0.5 MG tablet Take one tablet by mouth every 6 hours as needed for anxiety. 01/14/13  Yes Anderson Malta  Karam, NP  atorvastatin (LIPITOR) 10 MG tablet TAKE 1 TABLET BY MOUTH AT BEDTIME FOR CHOLESTEROL 11/13/12  Yes Mahima Pandey, MD  lisinopril (PRINIVIL,ZESTRIL) 40 MG tablet Take 40 mg by mouth every evening.    Yes Historical Provider, MD  mirtazapine (REMERON) 15 MG tablet Take one tablet by mouth at bedtime for rest 12/16/12  Yes Tiffany L Reed, DO  oxyCODONE-acetaminophen (PERCOCET) 7.5-325 MG per tablet  Take 1 tablet by mouth every 6 (six) hours as needed for pain. For pain 01/14/13  Yes Claudie Revering, NP  QUEtiapine (SEROQUEL) 200 MG tablet Take 2 tablets (400 mg total) by mouth at bedtime. 12/05/12  Yes Tiffany L Reed, DO  Aspirin-Acetaminophen-Caffeine (EXCEDRIN PO) Take 2 tablets by mouth every 6 (six) hours as needed. For headache    Historical Provider, MD     Allergies:  Allergies  Allergen Reactions  . Penicillins Rash    Social History:   reports that she has been smoking Cigarettes.  She has a 35 pack-year smoking history. She has never used smokeless tobacco. She reports that she does not drink alcohol or use illicit drugs.  Family History: Family History  Problem Relation Age of Onset  . Lung cancer Father   . Emphysema Mother   . Neurofibromatosis Mother   . Liver disease       Physical Exam: Filed Vitals:   01/27/13 1828 01/27/13 1943 01/27/13 2125  BP: 114/98    Pulse: 88    Temp: 98.3 F (36.8 C)    TempSrc: Oral    Resp: 20    SpO2: 98% 96% 96%   Blood pressure 114/98, pulse 88, temperature 98.3 F (36.8 C), temperature source Oral, resp. rate 20, SpO2 96.00%.  GEN:  Pleasant patient lying in the stretcher in no acute distress; cooperative with exam. PSYCH:  alert and oriented x4; does not appear anxious or depressed; affect is appropriate. HEENT: Mucous membranes pink and anicteric; PERRLA; EOM intact; no cervical lymphadenopathy nor thyromegaly or carotid bruit; no JVD; There were no stridor. Neck is very supple. Breasts:: Not examined CHEST WALL: No tenderness CHEST: tachypneic, but not in resp distress.  Bibasilar crackles, and diffuse wheezing. HEART: Regular rate and rhythm.  There are no murmur, rub, or gallops.   BACK: No kyphosis or scoliosis; no CVA tenderness ABDOMEN: soft and non-tender; no masses, no organomegaly, normal abdominal bowel sounds; no pannus; no intertriginous candida. There is no rebound and no distention. Rectal Exam: Not  done EXTREMITIES: No bone or joint deformity; age-appropriate arthropathy of the hands and knees; no edema; no ulcerations.  There is no calf tenderness. Genitalia: not examined PULSES: 2+ and symmetric SKIN: severe neurofibromas. CNS: Cranial nerves 2-12 grossly intact no focal lateralizing neurologic deficit.  Speech is fluent; uvula elevated with phonation, facial symmetry and tongue midline. DTR are normal bilaterally, cerebella exam is intact, barbinski is negative and strengths are equaled bilaterally.  No sensory loss.   Labs on Admission:  Basic Metabolic Panel:  Recent Labs Lab 01/27/13 1953  NA 140  K 4.4  CL 104  CO2 25  GLUCOSE 109*  BUN 18  CREATININE 0.79  CALCIUM 9.2   Liver Function Tests: No results found for this basename: AST, ALT, ALKPHOS, BILITOT, PROT, ALBUMIN,  in the last 168 hours No results found for this basename: LIPASE, AMYLASE,  in the last 168 hours No results found for this basename: AMMONIA,  in the last 168 hours CBC:  Recent Labs Lab 01/27/13 1953  WBC 6.2  NEUTROABS 4.1  HGB 12.0  HCT 36.3  MCV 91.4  PLT 239   Cardiac Enzymes:  Recent Labs Lab 01/27/13 1953  TROPONINI <0.30    CBG: No results found for this basename: GLUCAP,  in the last 168 hours   Radiological Exams on Admission: Dg Chest 2 View  01/27/2013   *RADIOLOGY REPORT*  Clinical Data: Pain  CHEST - 2 VIEW  Comparison: 11/04/2012  Findings: COPD with emphysema and diffuse pulmonary scarring. Increased lung density bilaterally is unchanged and is felt to be due to scarring. Negative for effusion  Left upper lobe density is more prominent than the prior study.  CT did reveal some pleural based thickening in this area posteriorly which is most likely scarring.  Spinal cord stimulator is unchanged.  There is thoracic dextroscoliosis.  IMPRESSION: COPD with scarring.  No superimposed acute abnormality.   Original Report Authenticated By: Janeece Riggers, M.D.    EKG:  Independently reviewed. NSR at 85, no acute ischemic changes.   Assessment/Plan Present on Admission:  . COPD with exacerbation . TOBACCO DEPENDENCE . SUBSTANCE ABUSE . NEUROFIBROMATOSIS . Hypoxemia . HYPERTENSION, BENIGN SYSTEMIC . DEPRESSIVE DISORDER, NOS . Chronic narcotic dependence  PLAN:  WIll admit her for COPD exacerbation.  I will continue neb tx and IV steroid.  Will continue Doxy for inpatient tx of COPD exacerbation.  She won't stop smoking.  Will continue her on 5 L [place].  While she is admitted, will cycle her troponins, just to be prudent.  I also will give her low dose dilaudid for her back pain.  She is stable, full code, and will be admitted to [facility] service.  Thank you for letting me participate in her care.  Other plans as per orders.  Code Status: FULL Unk Lightning, MD. [facility] Pager [phone] 7pm to 7am.  01/27/2013, 11:09 PM

## 2013-01-27 NOTE — ED Notes (Signed)
Patient transported to X-ray 

## 2013-01-27 NOTE — ED Notes (Signed)
Pt arrives by EMS from home with c/o SOB x's 3 days with productive green sputum. EMS also reports pt c/o back pain. 20G to L hand by EMS. Pt is 02 dependent 5L.

## 2013-01-28 LAB — CBC
MCV: 91.6 fL (ref 78.0–100.0)
Platelets: 219 10*3/uL (ref 150–400)
RBC: 3.79 MIL/uL — ABNORMAL LOW (ref 3.87–5.11)
RDW: 13.4 % (ref 11.5–15.5)
WBC: 7.4 10*3/uL (ref 4.0–10.5)

## 2013-01-28 LAB — TSH: TSH: 0.28 u[IU]/mL — ABNORMAL LOW (ref 0.350–4.500)

## 2013-01-28 LAB — CREATININE, SERUM
Creatinine, Ser: 0.8 mg/dL (ref 0.50–1.10)
GFR calc Af Amer: 88 mL/min — ABNORMAL LOW (ref >90)

## 2013-01-28 MED ORDER — ENOXAPARIN SODIUM 40 MG/0.4ML ~~LOC~~ SOLN
40.0000 mg | SUBCUTANEOUS | Status: DC
  Administered 2013-01-28 – 2013-01-29 (×2): 40 mg via SUBCUTANEOUS
  Filled 2013-01-28 (×2): qty 0.4

## 2013-01-28 MED ORDER — DOXYCYCLINE HYCLATE 100 MG IV SOLR
100.0000 mg | Freq: Two times a day (BID) | INTRAVENOUS | Status: DC
  Administered 2013-01-28: 100 mg via INTRAVENOUS
  Filled 2013-01-28 (×2): qty 100

## 2013-01-28 MED ORDER — METHYLPREDNISOLONE SODIUM SUCC 40 MG IJ SOLR
40.0000 mg | INTRAMUSCULAR | Status: DC
  Administered 2013-01-28 (×4): 40 mg via INTRAVENOUS
  Filled 2013-01-28 (×9): qty 1

## 2013-01-28 MED ORDER — ALPRAZOLAM 0.25 MG PO TABS
0.2500 mg | ORAL_TABLET | Freq: Three times a day (TID) | ORAL | Status: DC | PRN
  Administered 2013-01-28 – 2013-01-29 (×5): 0.25 mg via ORAL
  Filled 2013-01-28 (×5): qty 1

## 2013-01-28 MED ORDER — ATORVASTATIN CALCIUM 10 MG PO TABS
10.0000 mg | ORAL_TABLET | ORAL | Status: DC
  Administered 2013-01-28: 10 mg via ORAL
  Filled 2013-01-28 (×2): qty 1

## 2013-01-28 MED ORDER — HYDROMORPHONE HCL PF 1 MG/ML IJ SOLN: 0.5000 mg | INTRAMUSCULAR | Status: DC | PRN

## 2013-01-28 MED ORDER — LEVOFLOXACIN 750 MG PO TABS
750.0000 mg | ORAL_TABLET | Freq: Every day | ORAL | Status: DC
  Administered 2013-01-28 – 2013-01-29 (×2): 750 mg via ORAL
  Filled 2013-01-28 (×2): qty 1

## 2013-01-28 MED ORDER — BIOTENE DRY MOUTH MT LIQD
15.0000 mL | Freq: Two times a day (BID) | OROMUCOSAL | Status: DC
  Administered 2013-01-28 – 2013-01-29 (×2): 15 mL via OROMUCOSAL

## 2013-01-28 MED ORDER — OXYCODONE-ACETAMINOPHEN 5-325 MG PO TABS
1.0000 | ORAL_TABLET | ORAL | Status: DC | PRN
  Administered 2013-01-28 – 2013-01-29 (×5): 1 via ORAL
  Filled 2013-01-28 (×5): qty 1

## 2013-01-28 MED ORDER — SODIUM CHLORIDE 0.9 % IJ SOLN: 3.0000 mL | INTRAMUSCULAR | Status: DC | PRN

## 2013-01-28 MED ORDER — METHYLPREDNISOLONE SODIUM SUCC 40 MG IJ SOLR
40.0000 mg | Freq: Four times a day (QID) | INTRAMUSCULAR | Status: DC
  Administered 2013-01-28 – 2013-01-29 (×3): 40 mg via INTRAVENOUS
  Filled 2013-01-28 (×6): qty 1

## 2013-01-28 MED ORDER — OXYCODONE HCL 5 MG PO TABS: 2.5000 mg | ORAL_TABLET | ORAL | Status: DC | PRN

## 2013-01-28 MED ORDER — SODIUM CHLORIDE 0.9 % IJ SOLN: 3.0000 mL | Freq: Two times a day (BID) | INTRAMUSCULAR | Status: DC

## 2013-01-28 MED ORDER — QUETIAPINE FUMARATE 400 MG PO TABS
400.0000 mg | ORAL_TABLET | Freq: Every day | ORAL | Status: DC
  Administered 2013-01-28 (×2): 400 mg via ORAL
  Filled 2013-01-28 (×3): qty 1

## 2013-01-28 MED ORDER — ATORVASTATIN CALCIUM 10 MG PO TABS
10.0000 mg | ORAL_TABLET | Freq: Every day | ORAL | Status: DC
  Filled 2013-01-28: qty 1

## 2013-01-28 MED ORDER — ONDANSETRON HCL 4 MG/2ML IJ SOLN: 4.0000 mg | Freq: Four times a day (QID) | INTRAMUSCULAR | Status: DC | PRN

## 2013-01-28 MED ORDER — PANTOPRAZOLE SODIUM 40 MG PO TBEC
40.0000 mg | DELAYED_RELEASE_TABLET | Freq: Every day | ORAL | Status: DC
  Administered 2013-01-28 – 2013-01-29 (×2): 40 mg via ORAL
  Filled 2013-01-28 (×2): qty 1

## 2013-01-28 MED ORDER — ONDANSETRON HCL 4 MG PO TABS: 4.0000 mg | ORAL_TABLET | Freq: Four times a day (QID) | ORAL | Status: DC | PRN

## 2013-01-28 MED ORDER — ALBUTEROL SULFATE (5 MG/ML) 0.5% IN NEBU
2.5000 mg | INHALATION_SOLUTION | Freq: Four times a day (QID) | RESPIRATORY_TRACT | Status: DC
  Administered 2013-01-28 – 2013-01-29 (×3): 2.5 mg via RESPIRATORY_TRACT
  Filled 2013-01-28 (×3): qty 0.5

## 2013-01-28 MED ORDER — MIRTAZAPINE 15 MG PO TABS
15.0000 mg | ORAL_TABLET | Freq: Every day | ORAL | Status: DC
  Administered 2013-01-28 (×2): 15 mg via ORAL
  Filled 2013-01-28 (×3): qty 1

## 2013-01-28 MED ORDER — OXYCODONE-ACETAMINOPHEN 7.5-325 MG PO TABS: 1.0000 | ORAL_TABLET | ORAL | Status: DC | PRN

## 2013-01-28 MED ORDER — LISINOPRIL 40 MG PO TABS
40.0000 mg | ORAL_TABLET | Freq: Every evening | ORAL | Status: DC
  Administered 2013-01-28: 40 mg via ORAL
  Filled 2013-01-28 (×2): qty 1

## 2013-01-28 MED ORDER — ALUM & MAG HYDROXIDE-SIMETH 200-200-20 MG/5ML PO SUSP
15.0000 mL | Freq: Four times a day (QID) | ORAL | Status: DC | PRN
  Administered 2013-01-28: 15 mL via ORAL
  Filled 2013-01-28: qty 30

## 2013-01-28 MED ORDER — SODIUM CHLORIDE 0.9 % IV SOLN: 250.0000 mL | INTRAVENOUS | Status: DC | PRN

## 2013-01-28 MED ORDER — SODIUM CHLORIDE 0.9 % IJ SOLN
3.0000 mL | Freq: Two times a day (BID) | INTRAMUSCULAR | Status: DC
  Administered 2013-01-29: 3 mL via INTRAVENOUS

## 2013-01-28 NOTE — Plan of Care (Signed)
Problem: Phase I Progression Outcomes Goal: Pain controlled with appropriate interventions Outcome: Progressing Pt understands her pain meds and when to call for them.

## 2013-01-28 NOTE — Progress Notes (Signed)
[facility] PROGRESS NOTE  [name] WUJ:[id number] DOB: Apr 24, 1951 DOA: 01/27/2013 PCP: Bufford Spikes, DO  62 y.o. female with hx of severe neurofibromatosis, severe COPD on home oxygen at 5 liters [place], continued tobacco abuse, anxiety, hx of lung CA, hx of small pulmonary nodule (Dr Maple Hudson), chronic back pain on chronic narcotic use, presents to the ER with 3 days hx of increased SOB, productive cough with green sputum in the setting of COPD exacerbation.  Assessment/Plan: COPD exacerbation Continue IV sterids, scheduled nebs. Switch abx to levaquin Patient has severe COPD and is on 5L o2 via [place] at home which will be continued. She still continues to smoke and has been counseled on cessation.  HTN  continue lisinopril  Chest pain  appears to be due to  heartburn . EKG unremarkable. Ordered PPI and maalox.  Hyperlipidemia  Continue  statin  anxiety  continue xanax and seroquel  DVT prophylaxis : sq lovenox   Code Status: DNR Family Communication: none at bedside Disposition Plan: home once stable.   Consultants:  none  Procedures:  none  Antibiotics:  IV doxy ( 6/24)  IV levaquin (6/24>>)  HPI/Subjective: Patient informrs her SOB to be little better. C/o some heartburn.  Objective: Filed Vitals:   01/28/13 0532 01/28/13 1042 01/28/13 1327 01/28/13 1540  BP: 90/42  111/66   Pulse: 94  102   Temp: 97.6 F (36.4 C)  98.3 F (36.8 C)   TempSrc: Oral  Oral   Resp: 20  20   Height:      Weight:      SpO2: 93% 91% 94% 90%    Intake/Output Summary (Last 24 hours) at 01/28/13 1548 Last data filed at 01/28/13 1246  Gross per 24 hour  Intake    240 ml  Output    400 ml  Net   -160 ml   Filed Weights   01/28/13 0115  Weight: 56.6 kg (124 lb 12.5 oz)    Exam:   General:  Elderly female lying in bed in NAD  HEENT: no pallor, moist oral mucosa  Cardiovascular: NS1&S2, no murmurs, rubs or gallop  Respiratory: bibasilar crackles, no  rhonchi or wheeze  Abdomen: soft, NT, ND, BS+  Musculoskeletal: warm, no edema, neurofibromatosis skin lesions  CNS: AAOX3  Data Reviewed: Basic Metabolic Panel:  Recent Labs Lab 01/27/13 1953 01/28/13 0154  NA 140  --   K 4.4  --   CL 104  --   CO2 25  --   GLUCOSE 109*  --   BUN 18  --   CREATININE 0.79 0.80  CALCIUM 9.2  --    Liver Function Tests: No results found for this basename: AST, ALT, ALKPHOS, BILITOT, PROT, ALBUMIN,  in the last 168 hours No results found for this basename: LIPASE, AMYLASE,  in the last 168 hours No results found for this basename: AMMONIA,  in the last 168 hours CBC:  Recent Labs Lab 01/27/13 1953 01/28/13 0154  WBC 6.2 7.4  NEUTROABS 4.1  --   HGB 12.0 11.6*  HCT 36.3 34.7*  MCV 91.4 91.6  PLT 239 219   Cardiac Enzymes:  Recent Labs Lab 01/27/13 1953 01/28/13 0154 01/28/13 0635  TROPONINI <0.30 <0.30 <0.30   BNP (last 3 results)  Recent Labs  11/04/12 2309  PROBNP 162.4*   CBG: No results found for this basename: GLUCAP,  in the last 168 hours  No results found for this or any previous visit (from the past  240 hour(s)).   Studies: Dg Chest 2 View  01/27/2013   *RADIOLOGY REPORT*  Clinical Data: Pain  CHEST - 2 VIEW  Comparison: 11/04/2012  Findings: COPD with emphysema and diffuse pulmonary scarring. Increased lung density bilaterally is unchanged and is felt to be due to scarring. Negative for effusion  Left upper lobe density is more prominent than the prior study.  CT did reveal some pleural based thickening in this area posteriorly which is most likely scarring.  Spinal cord stimulator is unchanged.  There is thoracic dextroscoliosis.  IMPRESSION: COPD with scarring.  No superimposed acute abnormality.   Original Report Authenticated By: Janeece Riggers, M.D.    Scheduled Meds: . albuterol  2.5 mg Nebulization Q6H  . antiseptic oral rinse  15 mL Mouth Rinse BID  . atorvastatin  10 mg Oral q1800  . enoxaparin  (LOVENOX) injection  40 mg Subcutaneous Q24H  . levofloxacin  750 mg Oral Daily  . lisinopril  40 mg Oral QPM  . methylPREDNISolone (SOLU-MEDROL) injection  40 mg Intravenous Q4H  . mirtazapine  15 mg Oral QHS  . pantoprazole  40 mg Oral Daily  . QUEtiapine  400 mg Oral QHS  . sodium chloride  3 mL Intravenous Q12H  . sodium chloride  3 mL Intravenous Q12H   Continuous Infusions:     Time spent: 25 minutes    [name]  [facility] Pager [phone] If 7PM-7AM, please contact night-coverage at [email or web address], password [facility] 01/28/2013, 3:48 PM  LOS: 1 day

## 2013-01-28 NOTE — Progress Notes (Signed)
INITIAL NUTRITION ASSESSMENT  DOCUMENTATION CODES Per approved criteria  -Not Applicable   INTERVENTION: - Encouraged pt to consume at least half of her meals - Will continue to monitor   NUTRITION DIAGNOSIS: Inadequate oral intake related to poor appetite as evidenced by <50% meal intake.   Goal: Pt to consume >50% of meals  Monitor:  Weights, labs, intake  Reason for Assessment: Nutrition risk   62 y.o. female  Admitting Dx: COPD with exacerbation  ASSESSMENT: Pt discussed during interdisciplinary rounds. Pt admitted with shortness of breath and green sputum cough for 3 days. Met with pt who reports not eating anything for 3-4 days due to having worsening shortness of breath. Pt states before then she was eating fair, 2 meals/day. Pt thinks she may have lost 3-4 pounds in the past month. Pt reports so far today she had half of her oatmeal and a grilled cheese. Pt not interested in nutritional supplements.   Height: Ht Readings from Last 1 Encounters:  01/28/13 5\' 4"  (1.626 m)    Weight: Wt Readings from Last 1 Encounters:  01/28/13 124 lb 12.5 oz (56.6 kg)    Ideal Body Weight: 120 lb  % Ideal Body Weight: 103  Wt Readings from Last 10 Encounters:  01/28/13 124 lb 12.5 oz (56.6 kg)  12/05/12 124 lb 3.2 oz (56.337 kg)  11/21/12 128 lb 3.2 oz (58.151 kg)  09/26/12 132 lb 6.4 oz (60.056 kg)  08/27/12 130 lb (58.968 kg)  08/19/12 130 lb 12.8 oz (59.33 kg)  07/30/12 136 lb (61.689 kg)  03/07/12 129 lb 3.2 oz (58.605 kg)  11/06/11 133 lb (60.328 kg)  10/21/11 129 lb 3.2 oz (58.605 kg)    Usual Body Weight: 127-128 lb per pt report  % Usual Body Weight: 97-98  BMI:  Body mass index is 21.41 kg/(m^2).  Estimated Nutritional Needs: Kcal: 1415-1700 Protein: 55-70g Fluid: 1.4-1.7L/day  Skin: Intact   Diet Order: General  EDUCATION NEEDS: -No education needs identified at this time   Intake/Output Summary (Last 24 hours) at 01/28/13 1427 Last data  filed at 01/28/13 1246  Gross per 24 hour  Intake    240 ml  Output    400 ml  Net   -160 ml    Last BM: PTA  Labs:   Recent Labs Lab 01/27/13 1953 01/28/13 0154  NA 140  --   K 4.4  --   CL 104  --   CO2 25  --   BUN 18  --   CREATININE 0.79 0.80  CALCIUM 9.2  --   GLUCOSE 109*  --     CBG (last 3)  No results found for this basename: GLUCAP,  in the last 72 hours  Scheduled Meds: . albuterol  2.5 mg Nebulization Q6H  . antiseptic oral rinse  15 mL Mouth Rinse BID  . atorvastatin  10 mg Oral q1800  . enoxaparin (LOVENOX) injection  40 mg Subcutaneous Q24H  . levofloxacin  750 mg Oral Daily  . lisinopril  40 mg Oral QPM  . methylPREDNISolone (SOLU-MEDROL) injection  40 mg Intravenous Q4H  . mirtazapine  15 mg Oral QHS  . pantoprazole  40 mg Oral Daily  . QUEtiapine  400 mg Oral QHS  . sodium chloride  3 mL Intravenous Q12H  . sodium chloride  3 mL Intravenous Q12H    Continuous Infusions:   Past Medical History  Diagnosis Date  . DJD (degenerative joint disease)   . COPD (chronic obstructive  pulmonary disease)   . Tobacco dependence 2000    low back  . Avascular necrosis   . Osteoporosis   . Substance abuse   . Neurofibromatosis   . Hyperplastic colonic polyp   . CHF (congestive heart failure)   . Abnormal weight loss   . Essential hypertension, benign   . Depressive disorder, not elsewhere classified   . Constipation   . Low back pain   . Anxiety   . Lung cancer 2012    LUL  . Shortness of breath   . Traumatic pneumothorax     history of  . History of radiation therapy 02/06/11 -02/17/11    LUL    Past Surgical History  Procedure Laterality Date  . Laminectomy  02/2003  . Back surgery      multiple  . Tubal ligation    . Vesicovaginal fistula closure w/ tah    . Cholecystectomy    . Tonsilectomy, adenoidectomy, bilateral myringotomy and tubes    . Hip surgery      right hip, metal plate     Levon Hedger MS, RD, LDN [phone]  Pager [phone] After Hours Pager

## 2013-01-28 NOTE — Plan of Care (Signed)
Problem: Consults Goal: General Medical Patient Education See Patient Education Module for specific education. Outcome: Progressing Safety plan, pt is to call for assistance when needing to get up, bed alarm on to prevent falls.

## 2013-01-28 NOTE — Progress Notes (Signed)
UR COMPLETED  

## 2013-01-29 ENCOUNTER — Telehealth: Payer: Self-pay | Admitting: Internal Medicine

## 2013-01-29 DIAGNOSIS — IMO0002 Reserved for concepts with insufficient information to code with codable children: Secondary | ICD-10-CM

## 2013-01-29 MED ORDER — ALBUTEROL SULFATE (5 MG/ML) 0.5% IN NEBU: 2.5000 mg | INHALATION_SOLUTION | RESPIRATORY_TRACT | Status: DC | PRN

## 2013-01-29 MED ORDER — OXYCODONE-ACETAMINOPHEN 7.5-325 MG PO TABS: 1.0000 | ORAL_TABLET | Freq: Four times a day (QID) | ORAL | Status: DC | PRN

## 2013-01-29 MED ORDER — LEVOFLOXACIN 750 MG PO TABS: 750.0000 mg | ORAL_TABLET | Freq: Every day | ORAL | Status: DC

## 2013-01-29 MED ORDER — IPRATROPIUM-ALBUTEROL 0.5-2.5 (3) MG/3ML IN SOLN: 3.0000 mL | RESPIRATORY_TRACT | Status: DC | PRN

## 2013-01-29 MED ORDER — IPRATROPIUM-ALBUTEROL 20-100 MCG/ACT IN AERS: 1.0000 | INHALATION_SPRAY | RESPIRATORY_TRACT | Status: DC | PRN

## 2013-01-29 MED ORDER — FLUTICASONE-SALMETEROL 250-50 MCG/DOSE IN AEPB: 1.0000 | INHALATION_SPRAY | Freq: Two times a day (BID) | RESPIRATORY_TRACT | Status: DC

## 2013-01-29 MED ORDER — TIOTROPIUM BROMIDE MONOHYDRATE 18 MCG IN CAPS: 18.0000 ug | ORAL_CAPSULE | Freq: Every day | RESPIRATORY_TRACT | Status: DC

## 2013-01-29 MED ORDER — ALBUTEROL SULFATE (5 MG/ML) 0.5% IN NEBU: 2.5000 mg | INHALATION_SOLUTION | Freq: Four times a day (QID) | RESPIRATORY_TRACT | Status: DC

## 2013-01-29 NOTE — Telephone Encounter (Signed)
Admit date: 01/27/2013  Discharge date: 01/29/2013  Recommendations for Outpatient Follow-up:  1. Pt will need to follow up with PCP in 2-3 weeks post discharge 2. Please obtain BMP to evaluate electrolytes and kidney function 3. Please also check CBC to evaluate Hg and Hct levels 4. Please note that pt ws discharged on Levaquin to complete the therapy for 7 more days post discharge 5. Please also note that pt is on oxygen 5 L [place] at home 6. She was discharged on several new medications: Advair, Spiriva, Combivent inhaler and duoneb, this was discussed with her and pt has verbalized understanding 7. Pt has refused Prednisone upon discharge due to intolerance, she was not wheezing on exam     Pt aware that I will need to see when CY would like patient to follow up with [place]. Pt has appt on 7-10 with cancer MD and PCP on 02-10-13. Please advise. Thanks

## 2013-01-29 NOTE — Discharge Summary (Signed)
Physician Discharge Summary  JOLAN MEALOR ION:[id number] DOB: 11-06-1950 DOA: 01/27/2013  PCP: Bufford Spikes, DO  Admit date: 01/27/2013 Discharge date: 01/29/2013  Recommendations for Outpatient Follow-up:  1. Pt will need to follow up with PCP in 2-3 weeks post discharge 2. Please obtain BMP to evaluate electrolytes and kidney function 3. Please also check CBC to evaluate Hg and Hct levels 4. Please note that pt ws discharged on Levaquin to complete the therapy for 7 more days post discharge 5. Please also note that pt is on oxygen 5 L [place] at home 6. She was discharged on several new medications: Advair, Spiriva, Combivent inhaler and duoneb, this was discussed with her and pt has verbalized understanding 7. Pt has refused Prednisone upon discharge due to intolerance, she was not wheezing on exam  8. 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 female with hx of severe neurofibromatosis, severe COPD on home oxygen at 5 liters [place], continued tobacco abuse, anxiety, hx of lung CA, hx of small pulmonary nodule (Dr Maple Hudson), chronic back pain on chronic narcotic use, presents to the ER with 3 days hx of increased SOB, productive cough with green sputum in the setting of COPD exacerbation.   Assessment/Plan:  COPD exacerbation  Patient has severe COPD and is on 5L o2 via  at home which was continued inpatient  She still continues to smoke and has been counseled on  cessation.  She was provided supportive care and has responded well, has no wheezing on exam She will continue taking Levaquin for 7 more days post discharge She requested to go out and smoke and wants to go home.  HTN  continue lisinopril  Chest pain  appears to be due to heartburn . EKG unremarkable. Ordered PPI and maalox.  Hyperlipidemia  Continue statin  anxiety  continue xanax and seroquel   Procedures/Studies: Dg Chest 2 View  01/27/2013  COPD with scarring.  No superimposed acute abnormality.  Consultations:  None Antibiotics:  Levaquin 01/27/2013 ---> 7 more days post discharge   Discharge Exam: Filed Vitals:   01/29/13 0440  BP: 101/56  Pulse: 90  Temp: 97.7 F (36.5 C)  Resp: 18   Filed Vitals:   01/28/13 2259 01/29/13 0440 01/29/13 0500 01/29/13 0900  BP: 112/67 101/56    Pulse: 111 90    Temp: 98.7 F (37.1 C) 97.7 F (36.5 C)    TempSrc: Oral Oral    Resp: 18 18    Height:      Weight:   56.8 kg (125 lb 3.5 oz)   SpO2: 98% 95%  93%    General: Pt is alert, follows commands appropriately, not in acute distress Cardiovascular: Regular rhythm, tachycardic, S1/S2 +, no murmurs, no rubs, no gallops Respiratory: Clear to auscultation bilaterally, no wheezing, no crackles, no rhonchi Abdominal: Soft, non tender, non distended, bowel sounds +, no guarding Extremities: no edema, no cyanosis, pulses palpable bilaterally DP and PT Neuro: Grossly nonfocal  Discharge Instructions  Discharge Orders   Future Appointments Provider Department Dept Phone   02/20/2013 2:00 PM Waymon Budge, MD [facility] [phone]   03/11/2013 9:15 AM [facility] [phone]   03/17/2013 3:15 PM Tiffany Alroy Dust, DO [facility] [phone]   03/27/2013 1:00 PM Lurline Hare, MD [facility] [phone]   Future Orders Complete By Expires     Diet - low sodium heart healthy  As directed     Increase  activity slowly  As directed         Medication List    TAKE these medications       ALPRAZolam 0.5 MG tablet  Commonly known as:  XANAX  Take one tablet by mouth every 6 hours as needed for anxiety.     atorvastatin 10 MG tablet  Commonly known as:  LIPITOR  TAKE 1 TABLET BY MOUTH AT BEDTIME FOR CHOLESTEROL     EXCEDRIN PO  Take 2 tablets by mouth every 6 (six) hours as needed. For headache     Fluticasone-Salmeterol 250-50 MCG/DOSE Aepb  Commonly known as:  ADVAIR DISKUS  Inhale 1 puff into the lungs 2 (two) times daily.     ipratropium-albuterol 0.5-2.5 (3) MG/3ML Soln  Commonly known as:  DUONEB  Take 3 mLs by nebulization every 4 (four) hours as needed.     Ipratropium-Albuterol 20-100 MCG/ACT Aers respimat  Commonly known as:  COMBIVENT  Inhale 1 puff into the lungs every 4 (four) hours as needed for wheezing.     levofloxacin 750 MG tablet  Commonly known as:  LEVAQUIN  Take 1 tablet (750 mg total) by mouth daily.     lisinopril 40 MG tablet  Commonly known as:  PRINIVIL,ZESTRIL  Take 40 mg by mouth every evening.     mirtazapine 15 MG tablet  Commonly known as:  REMERON  Take one tablet by mouth at bedtime for rest     oxyCODONE-acetaminophen 7.5-325 MG per tablet  Commonly known as:  PERCOCET  Take 1 tablet by mouth every 6 (six) hours as needed for pain. For pain     QUEtiapine 200 MG tablet  Commonly known as:  SEROQUEL  Take 2 tablets (400 mg total) by mouth at bedtime.     tiotropium 18 MCG inhalation capsule  Commonly known as:  SPIRIVA HANDIHALER  Place 1 capsule (18 mcg total) into inhaler and inhale daily.           Follow-up Information   Follow up with REED, TIFFANY, DO In 2 weeks.   Contact information:   [address] [place] [address] [phone]        The results of significant diagnostics from this hospitalization (including imaging, microbiology, ancillary and laboratory) are listed below for reference.      Microbiology: No results found for this or any previous visit (from the past 240 hour(s)).   Labs: Basic Metabolic Panel:  Recent Labs Lab 01/27/13 1953 01/28/13 0154  NA 140  --   K 4.4  --   CL 104  --   CO2 25  --   GLUCOSE 109*  --   BUN 18  --   CREATININE 0.79 0.80  CALCIUM 9.2  --    CBC:  Recent Labs Lab 01/27/13 1953 01/28/13 0154  WBC 6.2 7.4  NEUTROABS 4.1  --   HGB 12.0 11.6*  HCT 36.3 34.7*  MCV 91.4 91.6  PLT 239 219  Cardiac Enzymes:  Recent Labs Lab 01/27/13 1953 01/28/13 0154 01/28/13 0635  TROPONINI <0.30 <0.30 <0.30   BNP: BNP (last 3 results)  Recent Labs  11/04/12 2309  PROBNP 162.4*   SIGNED: Time coordinating discharge: Over 30 minutes  Debbora Presto, MD  [facility] 01/29/2013, 12:40 PM Pager [phone]  If 7PM-7AM, please contact night-coverage [email or web address] Password [email or web address]

## 2013-01-29 NOTE — Telephone Encounter (Signed)
Per CY-have patient keep OV's with Cancer doctor and PCP then follow up with [place] at our next available slot.    LMTCB

## 2013-01-30 NOTE — Telephone Encounter (Signed)
ATC patient, no answer LMOMTCB 

## 2013-01-30 NOTE — Telephone Encounter (Signed)
Spoke with patient, Made her aware of recs per CY Patient verbalized understanding-- already scheduled to be seen 02/20/13. Nothing further needed at this time

## 2013-01-31 ENCOUNTER — Encounter: Payer: Self-pay | Admitting: Radiation Oncology

## 2013-01-31 ENCOUNTER — Ambulatory Visit
Admission: RE | Admit: 2013-01-31 | Discharge: 2013-01-31 | Disposition: A | Discharge status: Home / Self Care | Payer: Medicare Other | Source: Ambulatory Visit | Attending: Radiation Oncology | Admitting: Radiation Oncology

## 2013-01-31 NOTE — Progress Notes (Signed)
   Department of Radiation Oncology  Phone:  [phone] Fax:        [phone]   Name: [name] MRN: [id number]  DOB: 08-02-1951  Date: 01/31/2013  Follow Up Visit Note  Diagnosis: Stage I non-small cell lung cancer of the left upper lobe treated with SBRT to a total dose of 60 gray completed July 2012  Summary and Interval since last radiation: She is now 2 years out from treatment.   Interval History: [name] presents today for routine followup. She was recently hospitalized and was discharged yesterday. She was told to followup with her oncologist. She has followup scheduled with Dr. Maple Hudson in July. She had a CT of the chest in April which showed scarring in the left upper lobe. No measurable increase in size could be noted. She continues on 5 L nasal cannula. She continues to smoke. She continues to have poor appetite. She is weak from her recent hospitalization. She is breathing better. She denies any new head aches or new bone pain.  Allergies:  Allergies  Allergen Reactions  . Penicillins Rash   Physical Exam:  Filed Vitals:   01/31/13 1345  BP: 116/75  Pulse: 100  Temp: 98.2 F (36.8 C)  Resp: 24   she is a pleasant female in no distress sitting comfortably on examining chair. She has a nasal cannula in place. She has clear breath sounds on the left lung although they are distant. She has some very faint rhonchi on the right lower lung. She has no wheezes.  IMPRESSION: [name] is a 62 y.o. female status post SBRT for left upper lobe nodule with recent hospitalization for COPD exacerbation clinically and radiographically no evidence of disease  PLAN:  [name] is 2 years out from treatment. Her chance for local recurrence remains low. She is at risk for secondary primary is due to her continued tobacco abuse. I've scheduled her for followup with me in a year with a repeat CT at that time. She will followup with her primary care physician as well as Dr. Maple Hudson in the  interim.    Lurline Hare, MD

## 2013-01-31 NOTE — Progress Notes (Signed)
Follow up s/p rad txs 02/06/11-02/17/11 lung ca Patient on 5 liters oxygen ats=98%, sob, was d/c hospital yesterday exacerbation COPD, last cxr 6/23./14, last CT chest 11/18/12 Very weak and sob today, appetite poor, no difficulty swallowing or chewing,  Just drinking cola's , hasn't had anything  To eat today, offered coke per request 1:50 PM

## 2013-02-03 ENCOUNTER — Telehealth: Payer: Self-pay | Admitting: *Deleted

## 2013-02-03 NOTE — Telephone Encounter (Signed)
Called patient to inform of test and fu for 11-2013, spoke with patient and she is aware of these appts.

## 2013-02-10 ENCOUNTER — Telehealth: Payer: Self-pay | Admitting: Dietician

## 2013-02-10 ENCOUNTER — Encounter: Payer: Self-pay | Admitting: Internal Medicine

## 2013-02-10 ENCOUNTER — Ambulatory Visit (INDEPENDENT_AMBULATORY_CARE_PROVIDER_SITE_OTHER): Payer: Medicare Other | Admitting: Internal Medicine

## 2013-02-10 VITALS — BP 120/66 | HR 103 | Temp 99.0°F | Resp 20 | Ht 64.0 in | Wt 124.0 lb

## 2013-02-10 DIAGNOSIS — G4700 Insomnia, unspecified: Secondary | ICD-10-CM

## 2013-02-10 DIAGNOSIS — M545 Low back pain, unspecified: Secondary | ICD-10-CM

## 2013-02-10 DIAGNOSIS — J439 Emphysema, unspecified: Secondary | ICD-10-CM

## 2013-02-10 DIAGNOSIS — J438 Other emphysema: Secondary | ICD-10-CM

## 2013-02-10 MED ORDER — IPRATROPIUM-ALBUTEROL 0.5-2.5 (3) MG/3ML IN SOLN: 3.0000 mL | RESPIRATORY_TRACT | Status: DC | PRN

## 2013-02-10 MED ORDER — QUETIAPINE FUMARATE 200 MG PO TABS: 200.0000 mg | ORAL_TABLET | Freq: Every day | ORAL | Status: DC

## 2013-02-10 MED ORDER — OXYCODONE-ACETAMINOPHEN 7.5-325 MG PO TABS: 1.0000 | ORAL_TABLET | Freq: Four times a day (QID) | ORAL | Status: DC | PRN

## 2013-02-10 NOTE — Telephone Encounter (Signed)
Brief Outpatient Oncology Nutrition Note  Patient has been identified to be at risk on malnutrition screen.  Wt Readings from Last 10 Encounters:  02/10/13 124 lb (56.246 kg)  01/31/13 124 lb 9.6 oz (56.518 kg)  01/29/13 125 lb 3.5 oz (56.8 kg)  12/05/12 124 lb 3.2 oz (56.337 kg)  11/21/12 128 lb 3.2 oz (58.151 kg)  09/26/12 132 lb 6.4 oz (60.056 kg)  08/27/12 130 lb (58.968 kg)  08/19/12 130 lb 12.8 oz (59.33 kg)  07/30/12 136 lb (61.689 kg)  03/07/12 129 lb 3.2 oz (58.605 kg)      Patient with lung cancer/XRT with weight loss as above.  Recently admitted to the hospital and seen by inpatient RD 6/23 and was not eating at that time.  Not interested in nutritional supplements.  Called patient who is currently no available.  Message left with [facility]'s contact information.  Oran Rein, RD, LDN

## 2013-02-10 NOTE — Telephone Encounter (Signed)
Brief Outpatient Oncology Nutrition Note  Patient returned my call. Patient states that she is eating better but only twice a day and remains uninterested in nutritional supplements.  Discussed importance of eating several times a day to help meet nutritional needs.  Encouraged patient to call for any questions.  Oran Rein, RD, LDN

## 2013-02-10 NOTE — Progress Notes (Signed)
Patient ID: [name], female   DOB: [birth year], 62 y.o.   MRN: [id number] Location:  [facility]  Code Status: DNR   Allergies  Allergen Reactions  . Penicillins Rash    Chief Complaint  Patient presents with  . Hospitalization Follow-up    breathing issues    HPI: Patient is a 62 y.o. white female with neurofibromatosis and late stage COPD on chronic 5 liters O2 seen in the office today for f/u of chronic conditions.   Feels fair.  Has a headache again.  Says nothing she takes helps.  Uses nsaids excessively for this with excedrin migraine.   Needs refills on her seroquel--had gone down to one pill b/c of her sugar levels.   Has only a few percocet, but a fair number of xanax left.   Says she was sent home with neb treatments from the hospital, but has no nebulizer machine.   Due to affordability, she is using spiriva only as needed, not using advair or combivent.  She tells me she cannot afford all of these.  Samples given of spiriva and advair.  I also question if she is properly administering these.    Review of Systems:  Review of Systems  Constitutional: Positive for malaise/fatigue. Negative for fever and chills.  HENT: Negative for congestion.   Eyes: Negative for blurred vision.  Respiratory: Positive for cough, sputum production and shortness of breath.   Cardiovascular: Negative for chest pain and leg swelling.  Gastrointestinal: Negative for abdominal pain.  Genitourinary: Negative for dysuria.  Musculoskeletal: Positive for back pain. Negative for falls and myalgias.  Skin: Negative for rash.       neurofibromatosis  Neurological: Positive for weakness and headaches. Negative for dizziness and loss of consciousness.  Endo/Heme/Allergies: Bruises/bleeds easily.  Psychiatric/Behavioral: Positive for depression. Negative for memory loss. The patient is nervous/anxious and has insomnia.      Past Medical History   Diagnosis Date  . DJD (degenerative joint disease)   . COPD (chronic obstructive pulmonary disease)   . Tobacco dependence 2000    low back  . Avascular necrosis   . Osteoporosis   . Substance abuse   . Neurofibromatosis   . Hyperplastic colonic polyp   . CHF (congestive heart failure)   . Abnormal weight loss   . Essential hypertension, benign   . Depressive disorder, not elsewhere classified   . Constipation   . Low back pain   . Anxiety   . Lung cancer 2012    LUL  . Shortness of breath   . Traumatic pneumothorax     history of  . History of radiation therapy 02/06/11 -02/17/11    LUL    Past Surgical History  Procedure Laterality Date  . Laminectomy  02/2003  . Back surgery      multiple  . Tubal ligation    . Vesicovaginal fistula closure w/ tah    . Cholecystectomy    . Tonsilectomy, adenoidectomy, bilateral myringotomy and tubes    . Hip surgery      right hip, metal plate    Social History:   reports that she has been smoking Cigarettes.  She has a 35 pack-year smoking history. She has never used smokeless tobacco. She reports that she does not drink alcohol or use illicit drugs.  Family History  Problem Relation Age of Onset  . Lung cancer Father   . Emphysema Mother   . Neurofibromatosis Mother   .  Liver disease      Medications: Patient's Medications  New Prescriptions   No medications on file  Previous Medications   ALPRAZOLAM (XANAX) 0.5 MG TABLET    Take one tablet by mouth every 6 hours as needed for anxiety.   ASPIRIN-ACETAMINOPHEN-CAFFEINE (EXCEDRIN PO)    Take 2 tablets by mouth every 6 (six) hours as needed. For headache   ATORVASTATIN (LIPITOR) 10 MG TABLET    TAKE 1 TABLET BY MOUTH AT BEDTIME FOR CHOLESTEROL   FLUTICASONE-SALMETEROL (ADVAIR DISKUS) 250-50 MCG/DOSE AEPB    Inhale 1 puff into the lungs 2 (two) times daily.   IPRATROPIUM-ALBUTEROL (COMBIVENT) 20-100 MCG/ACT AERS RESPIMAT    Inhale 1 puff into the lungs every 4 (four) hours as  needed for wheezing.   IPRATROPIUM-ALBUTEROL (DUONEB) 0.5-2.5 (3) MG/3ML SOLN    Take 3 mLs by nebulization every 4 (four) hours as needed.   LEVOFLOXACIN (LEVAQUIN) 750 MG TABLET    Take 1 tablet (750 mg total) by mouth daily.   LISINOPRIL (PRINIVIL,ZESTRIL) 40 MG TABLET    Take 40 mg by mouth every evening.    MIRTAZAPINE (REMERON) 15 MG TABLET    Take one tablet by mouth at bedtime for rest   OXYCODONE-ACETAMINOPHEN (PERCOCET) 7.5-325 MG PER TABLET    Take 1 tablet by mouth every 6 (six) hours as needed for pain. For pain   QUETIAPINE (SEROQUEL) 200 MG TABLET    Take 2 tablets (400 mg total) by mouth at bedtime.   TIOTROPIUM (SPIRIVA HANDIHALER) 18 MCG INHALATION CAPSULE    Place 1 capsule (18 mcg total) into inhaler and inhale daily.  Modified Medications   No medications on file  Discontinued Medications   No medications on file     Physical Exam: Filed Vitals:   02/10/13 1434  BP: 120/66  Pulse: 103  Temp: 99 F (37.2 C)  TempSrc: Oral  Resp: 20  Height: 5\' 4"  (1.626 m)  Weight: 124 lb (56.246 kg)  SpO2: 99%  Physical Exam  Constitutional: She is oriented to person, place, and time. No distress.  HENT:  Head: Normocephalic and atraumatic.  Cardiovascular: Normal rate, regular rhythm, normal heart sounds and intact distal pulses.   Pulmonary/Chest: No respiratory distress. She has wheezes.  Coarse rhonchi unchanged from baseline, on 5L O2 chronically  Abdominal: Soft. Bowel sounds are normal. She exhibits no distension. There is no tenderness.  Musculoskeletal: Normal range of motion. She exhibits tenderness.  Of lumbar paravertebral muscles  Neurological: She is alert and oriented to person, place, and time.  Skin:  Neurofibromas covering body  Psychiatric:  anxious    Labs reviewed: Basic Metabolic Panel:  Recent Labs  09/81/19 2308 12/26/12 1123 01/27/13 1953 01/28/13 0154  NA 141 143 140  --   K 3.6 4.2 4.4  --   CL 105 107 104  --   CO2 23 24 25   --    GLUCOSE 130* 100* 109*  --   BUN 17 10 18   --   CREATININE 0.84 0.84 0.79 0.80  CALCIUM 9.5 9.5 9.2  --   TSH  --   --   --  0.280*   CBC:  Recent Labs  11/04/12 2308 12/26/12 1123 01/27/13 1953 01/28/13 0154  WBC 4.7 6.6 6.2 7.4  NEUTROABS 3.8 5.5 4.1  --   HGB 12.7 12.4 12.0 11.6*  HCT 36.1 36.8 36.3 34.7*  MCV 88.7 93 91.4 91.6  PLT 245  --  239 219   Lipid Panel:  Recent Labs  12/26/12 1123  HDL 43  LDLCALC 131*  TRIG 199*  CHOLHDL 5.0*   Lab Results  Component Value Date   HGBA1C  Value: 4.8 (NOTE)   The ADA recommends the following therapeutic goals for glycemic   control related to Hgb A1C measurement:   Goal of Therapy:   < 7.0% Hgb A1C   Action Suggested:  > 8.0% Hgb A1C   Ref:  Diabetes Care, 22, Suppl. 1, 1999 11/21/2007   Assessment/Plan 1. COPD (chronic obstructive pulmonary disease) with emphysema -continue to follow with  Pulmonary -given advair and spiriva devices here today b/c she tells me she cannot afford to get them at the pharmacy -she did not follow through with the necessary paperwork to get pharmaceutical support with paying for her meds when I have tried to help with this -I think her anxiety limits her ability to follow through on these things - CBC with Differential - Basic metabolic panel  2. Insomnia -persists, seroquel renewed  3. Low back pain -unchanged, renewed pain medications -has narcotic contract here and should not receive meds from other providers like urgent care, specialists, etc unless communicated with me.  Labs/tests ordered:   Orders Placed This Encounter  Procedures  . CBC with Differential  . Basic metabolic panel   Next appt:  3 mos

## 2013-02-11 ENCOUNTER — Other Ambulatory Visit: Payer: Self-pay | Admitting: Internal Medicine

## 2013-02-11 LAB — CBC WITH DIFFERENTIAL/PLATELET
Basophils Absolute: 0.1 10*3/uL (ref 0.0–0.2)
Basos: 0 % (ref 0–3)
Eos: 2 % (ref 0–5)
Eosinophils Absolute: 0.2 10*3/uL (ref 0.0–0.4)
HCT: 36.4 % (ref 34.0–46.6)
Hemoglobin: 12.1 g/dL (ref 11.1–15.9)
Immature Grans (Abs): 0 10*3/uL (ref 0.0–0.1)
Immature Granulocytes: 0 % (ref 0–2)
Lymphocytes Absolute: 4.1 10*3/uL — ABNORMAL HIGH (ref 0.7–3.1)
Lymphs: 37 % (ref 14–46)
MCH: 27.8 pg (ref 26.6–33.0)
MCHC: 33.2 g/dL (ref 31.5–35.7)
MCV: 84 fL (ref 79–97)
Monocytes Absolute: 0.9 10*3/uL (ref 0.1–0.9)
Monocytes: 8 % (ref 4–12)
Neutrophils Absolute: 6 10*3/uL (ref 1.4–7.0)
Neutrophils Relative %: 53 % (ref 40–74)
RBC: 4.35 x10E6/uL (ref 3.77–5.28)
RDW: 15.7 % — ABNORMAL HIGH (ref 12.3–15.4)
WBC: 11.2 10*3/uL — ABNORMAL HIGH (ref 3.4–10.8)

## 2013-02-11 LAB — BASIC METABOLIC PANEL
BUN/Creatinine Ratio: 16 (ref 11–26)
BUN: 13 mg/dL (ref 8–27)
CO2: 25 mmol/L (ref 18–29)
Calcium: 9.4 mg/dL (ref 8.6–10.2)
Chloride: 106 mmol/L (ref 97–108)
Creatinine, Ser: 0.83 mg/dL (ref 0.57–1.00)
GFR calc Af Amer: 86 mL/min/{1.73_m2} (ref >59)
GFR calc non Af Amer: 75 mL/min/{1.73_m2} (ref >59)
Glucose: 83 mg/dL (ref 65–99)
Potassium: 4.3 mmol/L (ref 3.5–5.2)
Sodium: 145 mmol/L — ABNORMAL HIGH (ref 134–144)

## 2013-02-12 ENCOUNTER — Other Ambulatory Visit: Payer: Self-pay | Admitting: Geriatric Medicine

## 2013-02-12 MED ORDER — LISINOPRIL 40 MG PO TABS: 40.0000 mg | ORAL_TABLET | Freq: Every evening | ORAL | Status: DC

## 2013-02-13 ENCOUNTER — Ambulatory Visit: Payer: Medicare Other | Admitting: Radiation Oncology

## 2013-02-20 ENCOUNTER — Ambulatory Visit (INDEPENDENT_AMBULATORY_CARE_PROVIDER_SITE_OTHER): Payer: Medicare Other | Admitting: Internal Medicine

## 2013-02-20 ENCOUNTER — Encounter: Payer: Self-pay | Admitting: Internal Medicine

## 2013-02-20 VITALS — BP 110/72 | HR 98 | Ht 61.75 in | Wt 121.2 lb

## 2013-02-20 DIAGNOSIS — F17200 Nicotine dependence, unspecified, uncomplicated: Secondary | ICD-10-CM

## 2013-02-20 DIAGNOSIS — J449 Chronic obstructive pulmonary disease, unspecified: Secondary | ICD-10-CM

## 2013-02-20 NOTE — Progress Notes (Signed)
Patient ID: [name] RICKLEFS, female    DOB: 1950/09/28, 62 y.o.   MRN: [id number]  HPI  12/09/10- 47 yoF smoker followed for COPD, tobacco abuse and lung nodule, still smoking. Complicating hx of extensive neurofibromatosis, depression, osteopenia. Last here August 11, 2010 after hosp twice for exacerbations of COPD, unable to afford meds. Oxygen dependent. During hosp CXR showed 1.4x1.0 LUL nodule.  Since last here, she had exacerbation with dyspnea while visiting sister in [place]. They admitted her to [facility] again 2-3 weeks ago- breathing better, but upped O2 to 5 L. They did needle bx of the lung nodule and told her POS for CANCER. She knows no more than that with no f/u planned at [facility].We are requesting records. She wants care here where she lives w/ husband. Very anxious- asks help for nerves- has taken xanax 1 mg in past w/o oversedation. Feels ok. Denies cough, phlegm, blood, pain or swelling. Easy DOE across room. Feet not swelling.   12/30/10- 59 yoF smoker followed for COPD, tobacco abuse , still smoking. Complicating hx of extensive neurofibromatosis, depression, osteopenia. Adenoca LUL/ XRT,  CAD, neurofibromatosis, meningioma Since last here, cytopath from [facility] confirmed needle lung bx adenoca. PET 13 suv w/ no mets. CT brain showed stable meningioma. We reviewed discussion at [facility] Conference where recognition that this lesion is on major fissure means too much lung resection for her COPD. Instead we will refer for XRT. She is trying to quit smoking. Denies chest pain. Productive cough with light brownish sputum is routine for her. Denies blood, fever, nodes or breast nodule.  Asks help for sleep, saying that xanax isn't helping.   06/13/11-  59 yoF smoker followed for COPD, tobacco abuse , still smoking. Complicating hx of extensive neurofibromatosis, depression, osteopenia. 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 Has had flu vaccine. Still smoking about 10 cigarettes a day and we discussed ways to quit including transfer to electronic cigarettes. She remains on continuous oxygen 4 L per minute/[facility]. More short of breath in the last week and a half, coughing some green or yellow sputum and feeling heavy through the chest. Denies fever, sore throat, palpitations, edema, blood. Had a MRSA abscess left axilla surgically drained. Completed antibiotics 2 weeks ago. Home health nurses packing it. We called in a prednisone taper last night for bronchitic exacerbation.  10/10/11-  59 yoF smoker followed for COPD, tobacco abuse , still smoking. Complicating hx of extensive neurofibromatosis, depression, osteopenia. Adenoca LUL/ XRT,  CAD, meningioma,  CAD, She is interested in trying again to quit smoking. Failed patches and electronic cigarettes. History of depression makes Chantix a poor choice. Home oxygen concentrator set at 5/ oxygen saturation usually 98% at home.. Coughs a little, nonproductive. Denies chest pain, blood or purulent sputum. Taking no pulmonary meds but does have a nebulizer.. CT chest 08/14/11- reviewed with her: Plan f/u CT this summer. IMPRESSION:  1. Further reduction in conspicuity of the left upper lobe nodule.  2. Cutaneous findings of neurofibromatosis.  3. Severe emphysema.  4. Stable small hepatic and splenic lesions are probably benign  but technically nonspecific; these are not associated with this  observable hypermetabolic activity on recent PET CT.  Original Report Authenticated By: Dellia Cloud, M.D.    11/06/11- 28 yoF smoker followed for COPD, tobacco abuse , still smoking. Complicating hx of extensive neurofibromatosis, depression, osteopenia. Adenoca LUL/ XRT,  CAD, meningioma,  CA Post hospital-pt was shown how to use Advair HFA, Spiriva, and albuterol neb tx today; However, patient is unable to use Tudorza-has a hard time breathing deep enough to  get medication out of inhaler  Hospitalized at [facility] March 12 through March 30 on teaching service for exacerbation of COPD. Has reduced smoking to 4 cigarettes a day but finds it very hard going. Has not found nicotine substitutes useful. We discussed other measures. Husband smokes at home. Complains of "lungs hurt" indicating lower anterior bilateral chest. Asks pain medication. Had been taking Percocet but ran out out pending return to her primary office followup. Discussed history of substance abuse. She states she wants DO NOT RESUSCITATE/DO NOT INTUBATE status "no matter what my family thinks". Wears oxygen at 5 L to address sense of shortness of breath which is at least partly anxiety. Chest x-ray 09/01/2011-COPD, spiculated left upper lobe nodule 1 cm/in area of prior XRT, mild bilateral opacities.  03/07/12- 59 yoF smoker followed for COPD, tobacco abuse , still smoking. Complicating hx of extensive neurofibromatosis, depression, osteopenia. Adenoca LUL/ XRT,  CAD, meningioma,  CA. (DNR/DNI 11/06/11). Breathing(SOB) getting worse; stays in bed all the time; K+ stays too high . COPD assessment test (CAT) scored 19/40 Her physician did lab work and told her potassium was too high-for dietary management. She stays in a hospital bed much of each day watching TV. Remains on oxygen 5 L/[facility]. Mentions spinal cord stimulator for pain CT chest 02/29/12-images reviewed with her IMPRESSION:  1. Stable CT of the chest. No new or progressive disease  identified.  2. Emphysema  3. Prominent coronary artery calcifications.   08/19/12-  61 yoF smoker followed for COPD, tobacco abuse , still smoking. Complicating hx of extensive neurofibromatosis, depression, osteopenia. Adenoca LUL/ hx XRT,  CAD, meningioma,  CA. (DNR/DNI 11/06/11). FOLLOWS FOR: recent hospital  stay(passed out and COPD) states breathing has gotten worse since last OV and since being out of the hospital. Says she "lied" and told them at  hospital she was better so that would let her home for Christmas. Continues oxygen 5 L/[facility]. She still smokes about 10 cigarettes a day and says her primary physician recommended against Chantix for her. Followup radiation oncology next month watching left upper lobe nodule. CT chest 07/28/12 IMPRESSION:  Left upper lobe nodular opacity is mildly increased in size, mild  increased surrounding linear opacity. This is nonspecific.  Recommend short-term (2-3 month) follow-up.  Emphysema.  Other findings similar to prior as above.  Original Report Authenticated By: Jearld Lesch, M.D.   11/21/12- 68 yoF smoker followed for COPD, tobacco abuse , still smoking. Complicating hx of extensive neurofibromatosis, depression, osteopenia. Adenoca LUL/ hx XRT,  CAD, meningioma,  . (DNR/DNI 11/06/11). FOLLOWS FOR: CT chest done 4/14-- pt states breathing is about the same, c/o prod cough w thick greenish yellow mucus occuring throughout entire day Chronic cough a little worse recently and sputum seems greener. Smoking about 10 cigarettes a day despite counseling. We reviewed this issue again. O2 5L/ Advanced CT chest 11/18/12 IMPRESSION:  Increased focal pleural thickening without measurable mass in the  left upper lobe which is nonspecific and could represent scarring  or potentially post radiation change if applicable. Plexiform  neurofibroma would be less likely but possible. This is amenable  to follow-up presumed future interval restaging studies.  Stable multiple lower thoracic/upper lumbar compression  deformities.  Original Report Authenticated By: Christiana Pellant, M.D.  02/20/13-  11 yoF smoker followed for COPD, tobacco  abuse , still smoking. Complicating hx of extensive neurofibromatosis, depression, osteopenia. Adenoca LUL/ hx XRT,  CAD, meningioma,  . (DNR/DNI 11/06/11) FOLLOWS FOR: recent hosp discharge 01/2013 for COPD exacerbation.  still having prod cough with green/yellow mucus, increased  SOB, wheezing, chest tightness.  denies f/c/s Hospital 01/27/2013 through 01/29/2013 for acute exacerbation of COPD. Was given nebulizer/[facility], but says she does not know how to use the machine. Continues oxygen 5 L per minute. No effort to stop smoking. I encouraged her to try. CXR 01/27/13 IMPRESSION:  COPD with scarring. No superimposed acute abnormality.  Original Report Authenticated By: Janeece Riggers, M.D.  Review of Systems-See HPI Constitutional:   No-   weight loss, night sweats, fevers, chills, fatigue, lassitude. HEENT:   No-  headaches, difficulty swallowing, tooth/dental problems, sore throat,       No-  sneezing, itching, ear ache, nasal congestion, post nasal drip,  CV:  No- anginal  chest pain, orthopnea, PND, swelling in lower extremities, anasarca,  dizziness, palpitations Resp: +baseline shortness of breath with exertion or at rest.              No-  productive cough,  + non-productive cough,  No- coughing up of blood.              No- change in color of mucus.  No- wheezing.   Skin: No change in fibromatosis GI:  No-   heartburn, indigestion, abdominal pain, nausea, vomiting,  GU:  MS:  No-   joint pain or swelling.  Neuro-     nothing unusual Psych:  No- change in mood or affect. + depression or anxiety.  No memory loss.  Objective:   Physical Exam  General- Alert, Oriented, Affect-appropriate, Distress- none acute; portable oxygen at 5 L, talkative . Odor of tobacco, + tripod posture Skin- +neurofibromatosis  Lymphadenopathy- none Head- atraumatic            Eyes- Gross vision intact, PERRLA, conjunctivae clear secretions            Ears- Hearing, canals-normal            Nose- Clear, no-Septal dev, mucus, polyps, erosion, perforation             Throat- Mallampati II , mucosa clear , drainage- none, tonsils- atrophic Neck- flexible , trachea midline, no stridor , thyroid nl, carotid no bruit Chest - symmetrical excursion , unlabored           Heart/CV- RRR ,  no murmur , no gallop  , no rub, nl s1 s2                           - JVD- none , edema- none, stasis changes- none, varices- none           Lung- clear to P&A but quite distant, wheeze- none, cough-none , dullness-none, rub- none           Chest wall-  Abd-  Br/ Gen/ Rectal- Not done, not indicated Extrem- cyanosis- none, clubbing, none, atrophy- none, strength- nl Neuro- grossly intact to observation

## 2013-02-20 NOTE — Patient Instructions (Addendum)
Neb treatment here with ipratropium-albuterol (DuoNeb) to teach you how to do it at home. You can also call [facility] if you have questions about using this.  Please call as needed

## 2013-02-21 ENCOUNTER — Emergency Department (HOSPITAL_COMMUNITY)
Admission: EM | Admit: 2013-02-21 | Discharge: 2013-02-21 | Disposition: A | Discharge status: Home / Self Care | Payer: Medicare Other | Attending: Emergency Medicine | Admitting: Emergency Medicine

## 2013-02-21 ENCOUNTER — Encounter (HOSPITAL_COMMUNITY): Payer: Self-pay | Admitting: *Deleted

## 2013-02-21 DIAGNOSIS — M545 Low back pain, unspecified: Secondary | ICD-10-CM | POA: Insufficient documentation

## 2013-02-21 DIAGNOSIS — Z923 Personal history of irradiation: Secondary | ICD-10-CM | POA: Insufficient documentation

## 2013-02-21 DIAGNOSIS — G8929 Other chronic pain: Secondary | ICD-10-CM | POA: Insufficient documentation

## 2013-02-21 DIAGNOSIS — Z8669 Personal history of other diseases of the nervous system and sense organs: Secondary | ICD-10-CM | POA: Insufficient documentation

## 2013-02-21 DIAGNOSIS — F3289 Other specified depressive episodes: Secondary | ICD-10-CM | POA: Insufficient documentation

## 2013-02-21 DIAGNOSIS — Z860100 Personal history of colon polyps, unspecified: Secondary | ICD-10-CM | POA: Insufficient documentation

## 2013-02-21 DIAGNOSIS — F329 Major depressive disorder, single episode, unspecified: Secondary | ICD-10-CM | POA: Insufficient documentation

## 2013-02-21 DIAGNOSIS — Z79899 Other long term (current) drug therapy: Secondary | ICD-10-CM | POA: Insufficient documentation

## 2013-02-21 DIAGNOSIS — Z87828 Personal history of other (healed) physical injury and trauma: Secondary | ICD-10-CM | POA: Insufficient documentation

## 2013-02-21 DIAGNOSIS — J441 Chronic obstructive pulmonary disease with (acute) exacerbation: Secondary | ICD-10-CM | POA: Insufficient documentation

## 2013-02-21 DIAGNOSIS — F17200 Nicotine dependence, unspecified, uncomplicated: Secondary | ICD-10-CM | POA: Insufficient documentation

## 2013-02-21 DIAGNOSIS — I10 Essential (primary) hypertension: Secondary | ICD-10-CM | POA: Insufficient documentation

## 2013-02-21 DIAGNOSIS — Z85118 Personal history of other malignant neoplasm of bronchus and lung: Secondary | ICD-10-CM | POA: Insufficient documentation

## 2013-02-21 DIAGNOSIS — Z8739 Personal history of other diseases of the musculoskeletal system and connective tissue: Secondary | ICD-10-CM | POA: Insufficient documentation

## 2013-02-21 DIAGNOSIS — Z8719 Personal history of other diseases of the digestive system: Secondary | ICD-10-CM | POA: Insufficient documentation

## 2013-02-21 DIAGNOSIS — F411 Generalized anxiety disorder: Secondary | ICD-10-CM | POA: Insufficient documentation

## 2013-02-21 DIAGNOSIS — Z880 Allergy status to penicillin: Secondary | ICD-10-CM | POA: Insufficient documentation

## 2013-02-21 DIAGNOSIS — I509 Heart failure, unspecified: Secondary | ICD-10-CM | POA: Insufficient documentation

## 2013-02-21 MED ORDER — METHOCARBAMOL 500 MG PO TABS: 500.0000 mg | ORAL_TABLET | Freq: Two times a day (BID) | ORAL | Status: DC

## 2013-02-21 MED ORDER — HYDROMORPHONE HCL PF 1 MG/ML IJ SOLN
1.0000 mg | Freq: Once | INTRAMUSCULAR | Status: AC
  Administered 2013-02-21: 1 mg via INTRAMUSCULAR
  Filled 2013-02-21: qty 1

## 2013-02-21 NOTE — ED Notes (Signed)
Reports chronic lower back pain but its more severe and having difficulty sleeping.

## 2013-02-21 NOTE — ED Provider Notes (Signed)
This chart was scribed for [name], a non-physician practitioner working with Doug Sou, MD by Lewanda Rife, ED Scribe. This patient was seen in room TR09C/TR09C and the patient's care was started at 1822.     History    CSN: [id number] Arrival date & time 02/21/13  1732  None    Chief Complaint  Patient presents with  . Back Pain   (Consider location/radiation/quality/duration/timing/severity/associated sxs/prior Treatment) The history is provided by the patient.   HPI Comments: [name] is a 62 y.o. female who presents to the Emergency Department complaining of chronic low back pain. Describes pain as severe and radiating down left leg. Reports associated waxing and waning paraesthesias down left leg. Denies associated urinary or bowel incontinence, fever, nausea, emesis, and abdominal pain. Reports taking prescribed percocet, and trying heating pad with no relief of symptoms. Reports taking 1 percocet every 8 hours.   Past Medical History  Diagnosis Date  . DJD (degenerative joint disease)   . COPD (chronic obstructive pulmonary disease)   . Tobacco dependence 2000    low back  . Avascular necrosis   . Osteoporosis   . Substance abuse   . Neurofibromatosis   . Hyperplastic colonic polyp   . CHF (congestive heart failure)   . Abnormal weight loss   . Essential hypertension, benign   . Depressive disorder, not elsewhere classified   . Constipation   . Low back pain   . Anxiety   . Lung cancer 2012    LUL  . Shortness of breath   . Traumatic pneumothorax     history of  . History of radiation therapy 02/06/11 -02/17/11    LUL   Past Surgical History  Procedure Laterality Date  . Laminectomy  02/2003  . Back surgery      multiple  . Tubal ligation    . Vesicovaginal fistula closure w/ tah    . Cholecystectomy    . Tonsilectomy, adenoidectomy, bilateral myringotomy and tubes    . Hip surgery      right hip, metal plate   Family  History  Problem Relation Age of Onset  . Lung cancer Father   . Emphysema Mother   . Neurofibromatosis Mother   . Liver disease     History  Substance Use Topics  . Smoking status: Current Some Day Smoker -- 1.00 packs/day for 35 years    Types: Cigarettes  . Smokeless tobacco: Never Used  . Alcohol Use: No   OB History   Grav Para Term Preterm Abortions TAB SAB Ect Mult Living                 Review of Systems  Musculoskeletal: Positive for back pain.  Psychiatric/Behavioral: Negative for confusion.  All other systems reviewed and are negative.  A complete 10 system review of systems was obtained and all systems are negative except as noted in the HPI and PMH.     Allergies  Penicillins  Home Medications   Current Outpatient Rx  Name  Route  Sig  Dispense  Refill  . ALPRAZolam (XANAX) 0.5 MG tablet      Take one tablet by mouth every 6 hours as needed for anxiety.   120 tablet   3   . atorvastatin (LIPITOR) 10 MG tablet      TAKE 1 TABLET BY MOUTH AT BEDTIME FOR CHOLESTEROL   30 tablet   0   . Fluticasone-Salmeterol (ADVAIR DISKUS) 250-50 MCG/DOSE AEPB  Inhalation   Inhale 1 puff into the lungs 2 (two) times daily.   60 each   12   . Ipratropium-Albuterol (COMBIVENT) 20-100 MCG/ACT AERS respimat   Inhalation   Inhale 1 puff into the lungs every 4 (four) hours as needed for wheezing.   1 Inhaler   11   . ipratropium-albuterol (DUONEB) 0.5-2.5 (3) MG/3ML SOLN   Nebulization   Take 3 mLs by nebulization every 4 (four) hours as needed.   360 mL   11   . lisinopril (PRINIVIL,ZESTRIL) 40 MG tablet   Oral   Take 1 tablet (40 mg total) by mouth every evening.   30 tablet   5   . mirtazapine (REMERON) 15 MG tablet      Take one tablet by mouth at bedtime for rest   30 tablet   3   . oxyCODONE-acetaminophen (PERCOCET) 7.5-325 MG per tablet   Oral   Take 1 tablet by mouth every 6 (six) hours as needed for pain. For pain   120 tablet   0   .  QUEtiapine (SEROQUEL) 200 MG tablet      TAKE 2 TABLETS BY MOUTH AT BEDTIME   30 tablet   0   . tiotropium (SPIRIVA HANDIHALER) 18 MCG inhalation capsule   Inhalation   Place 1 capsule (18 mcg total) into inhaler and inhale daily.   30 capsule   12    BP 113/72  Pulse 96  Temp(Src) 98 F (36.7 C) (Oral)  Resp 18  SpO2 99% Physical Exam  Nursing note and vitals reviewed. Constitutional: She is oriented to person, place, and time. She appears well-developed and well-nourished. No distress.  HENT:  Head: Normocephalic and atraumatic.  Eyes: EOM are normal.  Neck: Neck supple. No tracheal deviation present.  Cardiovascular: Normal rate and normal pulses.   Pulmonary/Chest: Effort normal. No respiratory distress.  Musculoskeletal: Normal range of motion.       Cervical back: She exhibits no tenderness and no bony tenderness.       Thoracic back: Normal. She exhibits no tenderness and no bony tenderness.       Lumbar back: She exhibits tenderness (left paraspinal ). She exhibits no bony tenderness.       Back:  Neurological: She is alert and oriented to person, place, and time. She has normal strength and normal reflexes.  Reflex Scores:      Patellar reflexes are 2+ on the right side and 2+ on the left side. Normal 5/5 strength. Normal bilateral dorsiflexion of feet and great toes.    Skin: Skin is warm and dry.  Psychiatric: She has a normal mood and affect. Her behavior is normal.    ED Course  Procedures (including critical care time) Medications  HYDROmorphone (DILAUDID) injection 1 mg (not administered)    Labs Reviewed - No data to display No results found. 1. Chronic back pain     MDM  PT with exacerbation of chronic back pain.  Pt neurovascularly intact. Normal reflexes. Normal sensation. No loss of bowels or urinary incontinence/retention. No recent falls or injuries. Pt ambulatory. Afebrile. No signs of cauda equina.  PT was just prescribed 120 tabs of  percocet 8 days ago. States she takes 1 every 6-8hrs. Instructed to take every 4 hrs for severe pain. Given dilaudid 1mg  IM in ED. She feels better. Home with robaxin for muscle spasms. incructed to follow up closely.   Filed Vitals:   02/21/13 1747  BP: 113/72  Pulse: 96  Temp: 98 F (36.7 C)  Resp: 18    I personally performed the services described in this documentation, which was scribed in my presence. The recorded information has been reviewed and is accurate.   Lottie Mussel, PA-C 02/21/13 1848

## 2013-02-21 NOTE — ED Notes (Signed)
Pt st's she is having pain in her back that is not relieved with Percocet.  St's when this happens she comes to ED and gets a shot.  Pt has appt with her MD on 7/23 but st's she could not wait that long

## 2013-02-22 NOTE — ED Provider Notes (Signed)
Medical screening examination/treatment/procedure(s) were performed by non-physician practitioner and as supervising physician I was immediately available for consultation/collaboration.  Hatsuko Bizzarro, MD 02/22/13 0135 

## 2013-03-02 ENCOUNTER — Other Ambulatory Visit: Payer: Self-pay | Admitting: Internal Medicine

## 2013-03-03 ENCOUNTER — Other Ambulatory Visit: Payer: Self-pay | Admitting: Geriatric Medicine

## 2013-03-03 MED ORDER — QUETIAPINE FUMARATE 200 MG PO TABS: 400.0000 mg | ORAL_TABLET | Freq: Every day | ORAL | Status: DC

## 2013-03-07 ENCOUNTER — Other Ambulatory Visit: Payer: Self-pay | Admitting: *Deleted

## 2013-03-07 DIAGNOSIS — I10 Essential (primary) hypertension: Secondary | ICD-10-CM

## 2013-03-07 DIAGNOSIS — Q8500 Neurofibromatosis, unspecified: Secondary | ICD-10-CM

## 2013-03-08 NOTE — Assessment & Plan Note (Signed)
I tried to help her believe that she actually can stop smoking and have again offered referral for support.

## 2013-03-08 NOTE — Assessment & Plan Note (Signed)
Plan-nebulizer treatment with Xopenex today, instructing patient so that she can give her own treatments with her new machine at home

## 2013-03-10 ENCOUNTER — Other Ambulatory Visit: Payer: Self-pay | Admitting: Geriatric Medicine

## 2013-03-11 ENCOUNTER — Other Ambulatory Visit: Payer: Medicare Other

## 2013-03-12 ENCOUNTER — Other Ambulatory Visit: Payer: Self-pay | Admitting: Geriatric Medicine

## 2013-03-12 DIAGNOSIS — M545 Low back pain: Secondary | ICD-10-CM

## 2013-03-12 MED ORDER — OXYCODONE-ACETAMINOPHEN 7.5-325 MG PO TABS: 1.0000 | ORAL_TABLET | Freq: Four times a day (QID) | ORAL | Status: DC | PRN

## 2013-03-13 ENCOUNTER — Other Ambulatory Visit: Payer: Medicare Other

## 2013-03-13 ENCOUNTER — Ambulatory Visit: Payer: Medicare Other | Admitting: Internal Medicine

## 2013-03-17 ENCOUNTER — Ambulatory Visit: Payer: Medicare Other | Admitting: Internal Medicine

## 2013-03-17 DIAGNOSIS — Z0289 Encounter for other administrative examinations: Secondary | ICD-10-CM

## 2013-03-20 ENCOUNTER — Ambulatory Visit: Payer: Medicare Other | Admitting: Internal Medicine

## 2013-03-27 ENCOUNTER — Ambulatory Visit: Payer: Medicare Other | Admitting: Radiation Oncology

## 2013-04-09 ENCOUNTER — Telehealth: Payer: Self-pay

## 2013-04-09 NOTE — Telephone Encounter (Signed)
Message left on triage voicemail, patient was seen in hospital a couple of weeks ago. Patient was told to increase Percocet to 2 by mouth every 6 hours as needed instead of 1 by mouth every 6 hours as needed. Patient is running out of RX sooner that usual, patient would like a new rx to reflect the increase.  Please advise on patient's request to change instructions on controlled medication  Per Dr. Glade Lloyd ( physician in office) send to patient's PCP

## 2013-04-10 ENCOUNTER — Ambulatory Visit (INDEPENDENT_AMBULATORY_CARE_PROVIDER_SITE_OTHER): Payer: Medicare Other | Admitting: Internal Medicine

## 2013-04-10 ENCOUNTER — Encounter: Payer: Self-pay | Admitting: Internal Medicine

## 2013-04-10 VITALS — BP 148/82 | HR 98 | Temp 98.5°F | Wt 125.0 lb

## 2013-04-10 DIAGNOSIS — Z23 Encounter for immunization: Secondary | ICD-10-CM

## 2013-04-10 DIAGNOSIS — M545 Low back pain, unspecified: Secondary | ICD-10-CM

## 2013-04-10 MED ORDER — OXYCODONE-ACETAMINOPHEN 7.5-325 MG PO TABS: 1.0000 | ORAL_TABLET | Freq: Four times a day (QID) | ORAL | Status: DC | PRN

## 2013-04-10 MED ORDER — OXYCODONE-ACETAMINOPHEN 7.5-325 MG PO TABS: 2.0000 | ORAL_TABLET | Freq: Four times a day (QID) | ORAL | Status: DC | PRN

## 2013-04-10 NOTE — Progress Notes (Signed)
Patient ID: [name], female   DOB: 11-04-1950, 62 y.o.   MRN: [id number] Location:  [facility]  Code Status: DNR  Allergies  Allergen Reactions  . Penicillins Rash    Chief Complaint  Patient presents with  . Headache    ongoing x long time ( no headache at the time of OV), last headache episode was yesterday, lasted all day   . Medication Management    discuss Percocet: instruction change  . Immunizations    discuss flu vaccine     HPI: Patient is a 62 y.o.  White female seen in the office today for med mgt chronic diseases, headache, flu shot.   Headaches are getting worse all over her head.  She rates it 10/10.  Is nauseous and vomited 2 days ago.  Is taking exedrin but it makes her get ringing in her ears.  She is aware she should not use it excessively.    She still has pain under her left breast radiating to her back.  She needs to take 2 percocet q 6 hrs for her pain.    Says she has a DVT in her left thigh.  She has cramping in that leg.  Wants her nerve stimulator removed.  Review of Systems:  Review of Systems  Eyes: Negative for blurred vision, double vision and pain.  Respiratory: Positive for cough and shortness of breath.   Cardiovascular: Negative for chest pain.       Pain under left breast that radiates to her back ever since she had the abscess in her axilla  Gastrointestinal: Positive for nausea and vomiting. Negative for diarrhea and constipation.  Musculoskeletal: Positive for back pain.  Psychiatric/Behavioral: Positive for depression. The patient is nervous/anxious.        Mood swings     Past Medical History  Diagnosis Date  . DJD (degenerative joint disease)   . COPD (chronic obstructive pulmonary disease)   . Tobacco dependence 2000    low back  . Avascular necrosis   . Osteoporosis   . Substance abuse   . Neurofibromatosis   . Hyperplastic colonic polyp   . CHF (congestive heart failure)   .  Abnormal weight loss   . Essential hypertension, benign   . Depressive disorder, not elsewhere classified   . Constipation   . Low back pain   . Anxiety   . Lung cancer 2012    LUL  . Shortness of breath   . Traumatic pneumothorax     history of  . History of radiation therapy 02/06/11 -02/17/11    LUL    Past Surgical History  Procedure Laterality Date  . Laminectomy  02/2003  . Back surgery      multiple  . Tubal ligation    . Vesicovaginal fistula closure w/ tah    . Cholecystectomy    . Tonsilectomy, adenoidectomy, bilateral myringotomy and tubes    . Hip surgery      right hip, metal plate    Social History:   reports that she has been smoking Cigarettes.  She has a 17.5 pack-year smoking history. She has never used smokeless tobacco. She reports that she does not drink alcohol or use illicit drugs.  Family History  Problem Relation Age of Onset  . Lung cancer Father   . Emphysema Mother   . Neurofibromatosis Mother   . Liver disease      Medications: Patient's Medications  New Prescriptions  No medications on file  Previous Medications   ACETAMINOPHEN (TYLENOL) 500 MG TABLET    Take 1,000 mg by mouth every 6 (six) hours as needed for pain.   ALPRAZOLAM (XANAX) 0.5 MG TABLET    Take one tablet by mouth every 6 hours as needed for anxiety.   ATORVASTATIN (LIPITOR) 10 MG TABLET    Take 10 mg by mouth at bedtime.   FLUTICASONE-SALMETEROL (ADVAIR DISKUS) 250-50 MCG/DOSE AEPB    Inhale 1 puff into the lungs 2 (two) times daily.   LISINOPRIL (PRINIVIL,ZESTRIL) 40 MG TABLET    Take 1 tablet (40 mg total) by mouth every evening.   METHOCARBAMOL (ROBAXIN) 500 MG TABLET    Take 1 tablet (500 mg total) by mouth 2 (two) times daily.   MIRTAZAPINE (REMERON) 15 MG TABLET    Take one tablet by mouth at bedtime for rest   OXYCODONE-ACETAMINOPHEN (PERCOCET) 7.5-325 MG PER TABLET    Take 1 tablet by mouth every 6 (six) hours as needed for pain. For pain   QUETIAPINE (SEROQUEL) 200  MG TABLET    TAKE 2 TABLETS BY MOUTH AT BEDTIME   QUETIAPINE (SEROQUEL) 200 MG TABLET    Take 2 tablets (400 mg total) by mouth at bedtime.   TIOTROPIUM (SPIRIVA HANDIHALER) 18 MCG INHALATION CAPSULE    Place 1 capsule (18 mcg total) into inhaler and inhale daily.  Modified Medications   No medications on file  Discontinued Medications   No medications on file     Physical Exam: Filed Vitals:   04/10/13 1330  BP: 148/82  Pulse: 98  Temp: 98.5 F (36.9 C)  TempSrc: Oral  Weight: 125 lb (56.7 kg)  SpO2: 90%  Physical Exam  Constitutional: She is oriented to person, place, and time. No distress.  HENT:  Head: Normocephalic and atraumatic.  Eyes: EOM are normal. Pupils are equal, round, and reactive to light.  Cardiovascular: Normal rate, regular rhythm, normal heart sounds and intact distal pulses.   Pulmonary/Chest: She has wheezes.  Abdominal: Soft. Bowel sounds are normal. She exhibits no distension and no mass. There is no tenderness.  Neurological: She is alert and oriented to person, place, and time.  Skin: Skin is warm and dry.  Neurofibromas cover her body  Psychiatric:  anxious    Labs reviewed: Basic Metabolic Panel:  Recent Labs  96/04/54 1123 01/27/13 1953 01/28/13 0154 02/10/13 1532  NA 143 140  --  145*  K 4.2 4.4  --  4.3  CL 107 104  --  106  CO2 24 25  --  25  GLUCOSE 100* 109*  --  83  BUN 10 18  --  13  CREATININE 0.84 0.79 0.80 0.83  CALCIUM 9.5 9.2  --  9.4  TSH  --   --  0.280*  --   CBC:  Recent Labs  11/04/12 2308 12/26/12 1123 01/27/13 1953 01/28/13 0154 02/10/13 1532  WBC 4.7 6.6 6.2 7.4 11.2*  NEUTROABS 3.8 5.5 4.1  --  6.0  HGB 12.7 12.4 12.0 11.6* 12.1  HCT 36.1 36.8 36.3 34.7* 36.4  MCV 88.7 93 91.4 91.6 84  PLT 245  --  239 219  --    Lipid Panel:  Recent Labs  12/26/12 1123  HDL 43  LDLCALC 131*  TRIG 199*  CHOLHDL 5.0*   Lab Results  Component Value Date   HGBA1C  Value: 4.8 (NOTE)   The ADA recommends the  following therapeutic goals for glycemic  control related to Hgb A1C measurement:   Goal of Therapy:   < 7.0% Hgb A1C   Action Suggested:  > 8.0% Hgb A1C   Ref:  Diabetes Care, 22, Suppl. 1, 1999 11/21/2007    Assessment/Plan 1. Low back pain -refilled percocet--2 tabs at a time -has lumbar spinal stenosis and DDD  2. Need for immunization against influenza -flu shot given   Next appt:  3 mos

## 2013-04-10 NOTE — Telephone Encounter (Signed)
Will discuss with patient at her appointment today.

## 2013-04-10 NOTE — Telephone Encounter (Signed)
Noted  

## 2013-04-30 ENCOUNTER — Other Ambulatory Visit: Payer: Self-pay | Admitting: Nurse Practitioner

## 2013-05-01 ENCOUNTER — Encounter: Payer: Self-pay | Admitting: Internal Medicine

## 2013-05-01 ENCOUNTER — Ambulatory Visit (INDEPENDENT_AMBULATORY_CARE_PROVIDER_SITE_OTHER): Payer: Medicare Other | Admitting: Internal Medicine

## 2013-05-01 VITALS — BP 132/84 | HR 80 | Temp 97.9°F | Wt 126.0 lb

## 2013-05-01 DIAGNOSIS — J438 Other emphysema: Secondary | ICD-10-CM

## 2013-05-01 DIAGNOSIS — J069 Acute upper respiratory infection, unspecified: Secondary | ICD-10-CM

## 2013-05-01 DIAGNOSIS — J439 Emphysema, unspecified: Secondary | ICD-10-CM

## 2013-05-01 MED ORDER — AZITHROMYCIN 250 MG PO TABS: ORAL_TABLET | ORAL | Status: DC

## 2013-05-01 NOTE — Progress Notes (Signed)
Patient ID: [name], female   DOB: 1951/02/12, 62 y.o.   MRN: [id number] Location:  [facility]  Code Status: DNR  Allergies  Allergen Reactions  . Penicillins Rash    Chief Complaint  Patient presents with  . Otalgia    right ear pain x 3 days  . Tinnitus    bilateral, worse in right ear     HPI: Patient is a 62 y.o. white female seen in the office today for  Right ear pain.  Pt. States it started at the first of the week. Has been sick since getting flu shot- had nausea/vomiting/diarrhea last week but has resolved. Pain in the right ear and it is ringing. Denies any drainage in that ear. Pt. States she has a running nose, cough and sore throat.  Pt. Has taken percocet and tylenol and that has relieved the pain temporarily.   Review of Systems:  Review of Systems  Constitutional: Negative for fever.  HENT: Positive for congestion and sore throat.   Eyes: Negative for discharge and redness.  Respiratory: Positive for cough, sputum production and shortness of breath.        Sputum is greenish/yellow   Past Medical History  Diagnosis Date  . DJD (degenerative joint disease)   . COPD (chronic obstructive pulmonary disease)   . Tobacco dependence 2000    low back  . Avascular necrosis   . Osteoporosis   . Substance abuse   . Neurofibromatosis   . Hyperplastic colonic polyp   . CHF (congestive heart failure)   . Abnormal weight loss   . Essential hypertension, benign   . Depressive disorder, not elsewhere classified   . Constipation   . Low back pain   . Anxiety   . Lung cancer 2012    LUL  . Shortness of breath   . Traumatic pneumothorax     history of  . History of radiation therapy 02/06/11 -02/17/11    LUL    Past Surgical History  Procedure Laterality Date  . Laminectomy  02/2003  . Back surgery      multiple  . Tubal ligation    . Vesicovaginal fistula closure w/ tah    . Cholecystectomy    . Tonsilectomy,  adenoidectomy, bilateral myringotomy and tubes    . Hip surgery      right hip, metal plate    Social History:   reports that she has been smoking Cigarettes.  She has a 17.5 pack-year smoking history. She has never used smokeless tobacco. She reports that she does not drink alcohol or use illicit drugs.  Family History  Problem Relation Age of Onset  . Lung cancer Father   . Emphysema Mother   . Neurofibromatosis Mother   . Liver disease      Medications: Patient's Medications  New Prescriptions   No medications on file  Previous Medications   ACETAMINOPHEN (TYLENOL) 500 MG TABLET    Take 1,000 mg by mouth every 6 (six) hours as needed for pain.   ALPRAZOLAM (XANAX) 0.5 MG TABLET    TAKE 1 TABLET BY MOUTH EVERY 6 HOURS AS NEEDED FOR ANXIETY   ATORVASTATIN (LIPITOR) 10 MG TABLET    Take 10 mg by mouth at bedtime.   FLUTICASONE-SALMETEROL (ADVAIR DISKUS) 250-50 MCG/DOSE AEPB    Inhale 1 puff into the lungs 2 (two) times daily.   LISINOPRIL (PRINIVIL,ZESTRIL) 40 MG TABLET    Take 1 tablet (40 mg  total) by mouth every evening.   METHOCARBAMOL (ROBAXIN) 500 MG TABLET    Take 1 tablet (500 mg total) by mouth 2 (two) times daily.   MIRTAZAPINE (REMERON) 15 MG TABLET    Take one tablet by mouth at bedtime for rest   OXYCODONE-ACETAMINOPHEN (PERCOCET) 7.5-325 MG PER TABLET    Take 2 tablets by mouth every 6 (six) hours as needed for pain. For pain   QUETIAPINE (SEROQUEL) 200 MG TABLET    Take 2 tablets (400 mg total) by mouth at bedtime.   TIOTROPIUM (SPIRIVA HANDIHALER) 18 MCG INHALATION CAPSULE    Place 1 capsule (18 mcg total) into inhaler and inhale daily.  Modified Medications   No medications on file  Discontinued Medications   QUETIAPINE (SEROQUEL) 200 MG TABLET    TAKE 2 TABLETS BY MOUTH AT BEDTIME     Physical Exam: Filed Vitals:   05/01/13 1034  BP: 132/84  Pulse: 80  Temp: 97.9 F (36.6 C)  TempSrc: Oral  Weight: 126 lb (57.153 kg)  SpO2: 96%   Physical Exam   Constitutional: No distress.  HENT:  Right Ear: External ear and ear canal normal. Tympanic membrane is scarred. A middle ear effusion is present.  Left Ear: External ear and ear canal normal. Tympanic membrane is scarred.  Nose: Nose normal.  Mouth/Throat: Uvula is midline, oropharynx is clear and moist and mucous membranes are normal. She has dentures. No oropharyngeal exudate.  Significant erythema present  Eyes: Conjunctivae are normal. Pupils are equal, round, and reactive to light. Right eye exhibits no discharge. Left eye exhibits no discharge.  Cardiovascular: Normal rate, regular rhythm, normal heart sounds and intact distal pulses.   Pulmonary/Chest: Effort normal. She has no wheezes.  Chronic rhonchi   Labs reviewed: Basic Metabolic Panel:  Recent Labs  16/10/96 1123 01/27/13 1953 01/28/13 0154 02/10/13 1532  NA 143 140  --  145*  K 4.2 4.4  --  4.3  CL 107 104  --  106  CO2 24 25  --  25  GLUCOSE 100* 109*  --  83  BUN 10 18  --  13  CREATININE 0.84 0.79 0.80 0.83  CALCIUM 9.5 9.2  --  9.4  TSH  --   --  0.280*  --   CBC:  Recent Labs  11/04/12 2308 12/26/12 1123 01/27/13 1953 01/28/13 0154 02/10/13 1532  WBC 4.7 6.6 6.2 7.4 11.2*  NEUTROABS 3.8 5.5 4.1  --  6.0  HGB 12.7 12.4 12.0 11.6* 12.1  HCT 36.1 36.8 36.3 34.7* 36.4  MCV 88.7 93 91.4 91.6 84  PLT 245  --  239 219  --    Lipid Panel:  Recent Labs  12/26/12 1123  HDL 43  LDLCALC 131*  TRIG 199*  CHOLHDL 5.0*   Lab Results  Component Value Date   HGBA1C  Value: 4.8 (NOTE)   The ADA recommends the following therapeutic goals for glycemic   control related to Hgb A1C measurement:   Goal of Therapy:   < 7.0% Hgb A1C   Action Suggested:  > 8.0% Hgb A1C   Ref:  Diabetes Care, 22, Suppl. 1, 1999 11/21/2007    Assessment/Plan 1. Upper respiratory infection with cough and congestion - azithromycin (ZITHROMAX) 250 MG tablet; Two tablets today and one daily for 4 days thereafter  Dispense: 6  tablet; Refill: 0 - I am concerned her URI will progress to severe COPD exacerbation if I do not treat her infection immediately and  she appears to have fluid behind her right ear causing her tinnitus and pain  2. COPD (chronic obstructive pulmonary disease) with emphysema -seems to be in early stages of exacerbation with increased cough and green sputum production so will give abx -is at high risk for diabetes with her seroquel already so would like to avoid steroid therapy if possible and she is not any more dyspneic than usual and lungs do not sound worse  Labs/tests ordered:  None today Next appt:  Keep as scheduled for routine f/u

## 2013-05-01 NOTE — Patient Instructions (Signed)
Make sure you drink plenty of water--try crystal lite since you don't like water Warm humidity will help your congestion

## 2013-05-07 ENCOUNTER — Other Ambulatory Visit: Payer: Self-pay | Admitting: *Deleted

## 2013-05-09 ENCOUNTER — Other Ambulatory Visit: Payer: Self-pay | Admitting: *Deleted

## 2013-05-09 DIAGNOSIS — M545 Low back pain: Secondary | ICD-10-CM

## 2013-05-09 MED ORDER — OXYCODONE-ACETAMINOPHEN 7.5-325 MG PO TABS: 2.0000 | ORAL_TABLET | Freq: Four times a day (QID) | ORAL | Status: DC | PRN

## 2013-05-12 ENCOUNTER — Emergency Department (HOSPITAL_COMMUNITY)
Admission: EM | Admit: 2013-05-12 | Discharge: 2013-05-12 | Disposition: A | Discharge status: Home / Self Care | Payer: Medicare Other | Attending: Emergency Medicine | Admitting: Emergency Medicine

## 2013-05-12 ENCOUNTER — Encounter (HOSPITAL_COMMUNITY): Payer: Self-pay | Admitting: *Deleted

## 2013-05-12 ENCOUNTER — Emergency Department (HOSPITAL_COMMUNITY): Payer: Medicare Other

## 2013-05-12 DIAGNOSIS — Z923 Personal history of irradiation: Secondary | ICD-10-CM | POA: Insufficient documentation

## 2013-05-12 DIAGNOSIS — S61209A Unspecified open wound of unspecified finger without damage to nail, initial encounter: Secondary | ICD-10-CM | POA: Insufficient documentation

## 2013-05-12 DIAGNOSIS — F329 Major depressive disorder, single episode, unspecified: Secondary | ICD-10-CM | POA: Insufficient documentation

## 2013-05-12 DIAGNOSIS — M546 Pain in thoracic spine: Secondary | ICD-10-CM

## 2013-05-12 DIAGNOSIS — F17200 Nicotine dependence, unspecified, uncomplicated: Secondary | ICD-10-CM | POA: Insufficient documentation

## 2013-05-12 DIAGNOSIS — I10 Essential (primary) hypertension: Secondary | ICD-10-CM | POA: Insufficient documentation

## 2013-05-12 DIAGNOSIS — Z85118 Personal history of other malignant neoplasm of bronchus and lung: Secondary | ICD-10-CM | POA: Insufficient documentation

## 2013-05-12 DIAGNOSIS — F411 Generalized anxiety disorder: Secondary | ICD-10-CM | POA: Insufficient documentation

## 2013-05-12 DIAGNOSIS — Z8739 Personal history of other diseases of the musculoskeletal system and connective tissue: Secondary | ICD-10-CM | POA: Insufficient documentation

## 2013-05-12 DIAGNOSIS — Z8719 Personal history of other diseases of the digestive system: Secondary | ICD-10-CM | POA: Insufficient documentation

## 2013-05-12 DIAGNOSIS — Z860100 Personal history of colon polyps, unspecified: Secondary | ICD-10-CM | POA: Insufficient documentation

## 2013-05-12 DIAGNOSIS — I509 Heart failure, unspecified: Secondary | ICD-10-CM | POA: Insufficient documentation

## 2013-05-12 DIAGNOSIS — J441 Chronic obstructive pulmonary disease with (acute) exacerbation: Secondary | ICD-10-CM | POA: Insufficient documentation

## 2013-05-12 DIAGNOSIS — Y92410 Unspecified street and highway as the place of occurrence of the external cause: Secondary | ICD-10-CM | POA: Insufficient documentation

## 2013-05-12 DIAGNOSIS — IMO0002 Reserved for concepts with insufficient information to code with codable children: Secondary | ICD-10-CM | POA: Insufficient documentation

## 2013-05-12 DIAGNOSIS — Z880 Allergy status to penicillin: Secondary | ICD-10-CM | POA: Insufficient documentation

## 2013-05-12 DIAGNOSIS — Y9389 Activity, other specified: Secondary | ICD-10-CM | POA: Insufficient documentation

## 2013-05-12 DIAGNOSIS — F3289 Other specified depressive episodes: Secondary | ICD-10-CM | POA: Insufficient documentation

## 2013-05-12 DIAGNOSIS — Z79899 Other long term (current) drug therapy: Secondary | ICD-10-CM | POA: Insufficient documentation

## 2013-05-12 DIAGNOSIS — S8990XA Unspecified injury of unspecified lower leg, initial encounter: Secondary | ICD-10-CM | POA: Insufficient documentation

## 2013-05-12 DIAGNOSIS — Z792 Long term (current) use of antibiotics: Secondary | ICD-10-CM | POA: Insufficient documentation

## 2013-05-12 MED ORDER — HYDROCODONE-ACETAMINOPHEN 5-325 MG PO TABS: 1.0000 | ORAL_TABLET | Freq: Once | ORAL | Status: DC

## 2013-05-12 MED ORDER — OXYCODONE-ACETAMINOPHEN 5-325 MG PO TABS
2.0000 | ORAL_TABLET | Freq: Once | ORAL | Status: AC
  Administered 2013-05-12: 2 via ORAL
  Filled 2013-05-12: qty 2
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
## 2013-05-16 ENCOUNTER — Telehealth: Payer: Self-pay | Admitting: Internal Medicine

## 2013-05-16 MED ORDER — PREDNISONE 10 MG PO TABS: ORAL_TABLET | ORAL | Status: DC

## 2013-05-16 MED ORDER — DOXYCYCLINE HYCLATE 100 MG PO TABS: ORAL_TABLET | ORAL | Status: DC

## 2013-05-16 NOTE — Telephone Encounter (Signed)
Per CY-give Prednisone 10 mg #20 take 4 x 2 days, 3 x 2 days, 2 x 2 days, 1 x 2 days, then stop no refills and Doxycycline 100 mg #8 take 2 today then 1 daily no refills.

## 2013-05-16 NOTE — Telephone Encounter (Signed)
Called, spoke with pt - States breathing being "horrible."  C/o not being able to walk from one room to another without "gasping to breathe."  States this has been going on all week but she has been "putting it off."  Reports o2 sats [phone] at rest and exertion on 5 lpm o2. Pt also reports wheezing and prod cough with yellow, green, to brown mucus.  No chest tightness, chest pain, or f/c/s.  Pt is using albuterol neb with some relief.  Requesting OV with CDY ASAP.  Dr. Maple Hudson, pls advise as there are no openings prior to the weekend.  Thank you.    [facility]  Allergies  Allergen Reactions  . Penicillins Rash    Last OV with CDY: 02/20/13

## 2013-05-16 NOTE — Telephone Encounter (Signed)
Called, spoke with pt. Informed her of below per Dr. Maple Hudson.  She verbalized understanding and is aware rxs sent to [facility].  Pt is to call back if symptoms do not improve or worsen and is to seek emergency care if needed.

## 2013-05-23 ENCOUNTER — Encounter (HOSPITAL_COMMUNITY): Payer: Self-pay | Admitting: Emergency Medicine

## 2013-05-23 ENCOUNTER — Observation Stay (HOSPITAL_COMMUNITY)
Admission: EM | Admit: 2013-05-23 | Discharge: 2013-05-27 | Disposition: A | Discharge status: Home / Self Care | Payer: Medicare Other | Attending: Internal Medicine | Admitting: Internal Medicine

## 2013-05-23 ENCOUNTER — Emergency Department (HOSPITAL_COMMUNITY): Payer: Medicare Other

## 2013-05-23 ENCOUNTER — Ambulatory Visit: Payer: Medicare Other | Admitting: Internal Medicine

## 2013-05-23 DIAGNOSIS — R0789 Other chest pain: Secondary | ICD-10-CM | POA: Insufficient documentation

## 2013-05-23 DIAGNOSIS — R55 Syncope and collapse: Principal | ICD-10-CM

## 2013-05-23 DIAGNOSIS — I509 Heart failure, unspecified: Secondary | ICD-10-CM | POA: Insufficient documentation

## 2013-05-23 DIAGNOSIS — Z79899 Other long term (current) drug therapy: Secondary | ICD-10-CM | POA: Insufficient documentation

## 2013-05-23 DIAGNOSIS — J449 Chronic obstructive pulmonary disease, unspecified: Secondary | ICD-10-CM

## 2013-05-23 DIAGNOSIS — I10 Essential (primary) hypertension: Secondary | ICD-10-CM | POA: Insufficient documentation

## 2013-05-23 DIAGNOSIS — J438 Other emphysema: Secondary | ICD-10-CM | POA: Insufficient documentation

## 2013-05-23 DIAGNOSIS — Z85118 Personal history of other malignant neoplasm of bronchus and lung: Secondary | ICD-10-CM | POA: Insufficient documentation

## 2013-05-23 DIAGNOSIS — Q8500 Neurofibromatosis, unspecified: Secondary | ICD-10-CM | POA: Insufficient documentation

## 2013-05-23 DIAGNOSIS — J4489 Other specified chronic obstructive pulmonary disease: Secondary | ICD-10-CM

## 2013-05-23 DIAGNOSIS — M545 Low back pain: Secondary | ICD-10-CM

## 2013-05-23 DIAGNOSIS — L02412 Cutaneous abscess of left axilla: Secondary | ICD-10-CM

## 2013-05-23 LAB — CBC
MCHC: 33.6 g/dL (ref 30.0–36.0)
MCV: 96.1 fL (ref 78.0–100.0)
Platelets: 215 10*3/uL (ref 150–400)
RDW: 13.8 % (ref 11.5–15.5)
WBC: 5.8 10*3/uL (ref 4.0–10.5)

## 2013-05-23 LAB — BASIC METABOLIC PANEL
Chloride: 109 mEq/L (ref 96–112)
Creatinine, Ser: 0.71 mg/dL (ref 0.50–1.10)
GFR calc Af Amer: >90 mL/min (ref >90)
GFR calc non Af Amer: 89 mL/min — ABNORMAL LOW (ref >90)
Potassium: 4.8 mEq/L (ref 3.5–5.1)

## 2013-05-23 LAB — POCT I-STAT TROPONIN I

## 2013-05-23 LAB — PRO B NATRIURETIC PEPTIDE: Pro B Natriuretic peptide (BNP): 635.7 pg/mL — ABNORMAL HIGH (ref 0–125)

## 2013-05-23 MED ORDER — ATORVASTATIN CALCIUM 10 MG PO TABS
10.0000 mg | ORAL_TABLET | Freq: Every day | ORAL | Status: DC
  Administered 2013-05-23 – 2013-05-26 (×4): 10 mg via ORAL
  Filled 2013-05-23 (×5): qty 1

## 2013-05-23 MED ORDER — OXYCODONE HCL 5 MG PO TABS
2.5000 mg | ORAL_TABLET | Freq: Four times a day (QID) | ORAL | Status: DC | PRN
  Administered 2013-05-23 – 2013-05-24 (×2): 2.5 mg via ORAL
  Filled 2013-05-23 (×2): qty 1

## 2013-05-23 MED ORDER — ACETAMINOPHEN 500 MG PO TABS: 1000.0000 mg | ORAL_TABLET | Freq: Four times a day (QID) | ORAL | Status: DC | PRN

## 2013-05-23 MED ORDER — SODIUM CHLORIDE 0.9 % IJ SOLN
3.0000 mL | Freq: Two times a day (BID) | INTRAMUSCULAR | Status: DC
  Administered 2013-05-23 – 2013-05-27 (×7): 3 mL via INTRAVENOUS

## 2013-05-23 MED ORDER — LISINOPRIL 40 MG PO TABS
40.0000 mg | ORAL_TABLET | Freq: Every evening | ORAL | Status: DC
  Administered 2013-05-23 – 2013-05-26 (×4): 40 mg via ORAL
  Filled 2013-05-23 (×4): qty 1
  Filled 2013-05-23: qty 2

## 2013-05-23 MED ORDER — HEPARIN SODIUM (PORCINE) 5000 UNIT/ML IJ SOLN
5000.0000 [IU] | Freq: Three times a day (TID) | INTRAMUSCULAR | Status: DC
  Administered 2013-05-23 – 2013-05-27 (×11): 5000 [IU] via SUBCUTANEOUS
  Filled 2013-05-23 (×13): qty 1

## 2013-05-23 MED ORDER — MORPHINE SULFATE 4 MG/ML IJ SOLN
4.0000 mg | Freq: Once | INTRAMUSCULAR | Status: AC
  Administered 2013-05-23: 4 mg via INTRAVENOUS
  Filled 2013-05-23: qty 1

## 2013-05-23 MED ORDER — IOHEXOL 350 MG/ML SOLN
100.0000 mL | Freq: Once | INTRAVENOUS | Status: AC | PRN
  Administered 2013-05-23: 100 mL via INTRAVENOUS

## 2013-05-23 MED ORDER — OXYCODONE-ACETAMINOPHEN 5-325 MG PO TABS
1.0000 | ORAL_TABLET | Freq: Four times a day (QID) | ORAL | Status: DC | PRN
  Administered 2013-05-23 – 2013-05-24 (×2): 1 via ORAL
  Filled 2013-05-23 (×2): qty 1

## 2013-05-23 MED ORDER — ASPIRIN-ACETAMINOPHEN-CAFFEINE 250-250-65 MG PO TABS
2.0000 | ORAL_TABLET | Freq: Four times a day (QID) | ORAL | Status: DC | PRN
  Filled 2013-05-23: qty 2

## 2013-05-23 MED ORDER — ALPRAZOLAM 0.5 MG PO TABS
0.5000 mg | ORAL_TABLET | Freq: Four times a day (QID) | ORAL | Status: DC | PRN
  Administered 2013-05-23 – 2013-05-27 (×11): 0.5 mg via ORAL
  Filled 2013-05-23 (×11): qty 1

## 2013-05-23 MED ORDER — MIRTAZAPINE 15 MG PO TABS
15.0000 mg | ORAL_TABLET | Freq: Every day | ORAL | Status: DC
  Administered 2013-05-23 – 2013-05-26 (×4): 15 mg via ORAL
  Filled 2013-05-23 (×5): qty 1

## 2013-05-23 MED ORDER — QUETIAPINE FUMARATE 400 MG PO TABS
400.0000 mg | ORAL_TABLET | Freq: Every day | ORAL | Status: DC
  Administered 2013-05-23 – 2013-05-26 (×4): 400 mg via ORAL
  Filled 2013-05-23 (×5): qty 1

## 2013-05-23 MED ORDER — OXYCODONE-ACETAMINOPHEN 7.5-325 MG PO TABS: 1.0000 | ORAL_TABLET | Freq: Four times a day (QID) | ORAL | Status: DC | PRN

## 2013-05-23 MED ORDER — BIOTENE DRY MOUTH MT LIQD
15.0000 mL | Freq: Two times a day (BID) | OROMUCOSAL | Status: DC
  Administered 2013-05-24 – 2013-05-26 (×4): 15 mL via OROMUCOSAL

## 2013-05-23 NOTE — ED Provider Notes (Signed)
4:00 PM Patient signed out by Rubye Oaks, PA-C at shift change.  Patient with a history of Lung Cancer and COPD on chronic home oxygen presents today with a chief complaint of SOB, headache, and syncope.  She reports that she has had three episodes of syncope earlier today.  Plan is for the patient to receive a CT angio chest once labs have returned.  CT head is negative.  Patient will then be admitted for additional work up of syncope.  8:30 PM CT results: IMPRESSION:  No CT findings for pulmonary embolism.  Slightly progressive area of pleural and parenchymal density in the  apical posterior segment of the left upper lobe. Recommend continued  observation with a repeat noncontrast chest CT in 4 months. PET-CT  or biopsy may be indicated if this continues to show progression.  8:50 PM Patient discussed with Dr. Julian Reil with [facility] who has agreed to admit the patient.    Pascal Lux [place], PA-C 05/24/13 [phone]

## 2013-05-23 NOTE — ED Notes (Signed)
Pt c/o HA and near syncope when standing x 2 days with increased SOB; pt on 5 L/min home O2

## 2013-05-23 NOTE — ED Notes (Signed)
Family at bedside. 

## 2013-05-23 NOTE — ED Provider Notes (Signed)
CSN: [id number]     Arrival date & time 05/23/13  1311 History   First MD Initiated Contact with Patient 05/23/13 1415     Chief Complaint  Patient presents with  . Shortness of Breath  . Loss of Consciousness  . Headache    HPI  [name] is a 62 y.o. female with a PMH of COPD, DJD, AVN, osteoporosis, substance abuse, neurofibromatosis, CHF, HTN, depression, constipation, low back pain, anxiety, lung cancer s/p radiation, SOB, traumatic pneumothorax who presents to the ED for evaluation of SOB, LOC, and headache.  History was provided by the patient.  Patient states that she has had progressively worsening shortness of breath for the past 2 days. Patient is on 5 L of home oxygen at baseline. She states that she has been coughing with green and yellow sputum. She denies any hemoptysis. She still currently smoking. She also complains of diffuse chest tightness which is worse with coughing and deep breathing.  She states she has had three episodes of "passing out."  Patient states that it occurred when she went from a sitting to a standing position. States that she had the sensation that she felt like she pass out and then woke up on the floor. She states that she's unsure how long she was unconscious.  She is unsure if she hit her head but she states she has a headache described as a pressure in the top of her head.  She denies any vision changes, neck pain/stiffness, focal weakness, loss of sensation, or numbness/tingling.  She states she feels "weak all over." No fevers, rhinorrhea, sore throat, abdominal pain, nausea, emesis, diarrhea, constipation, dysuria, or leg edema.     Past Medical History  Diagnosis Date  . DJD (degenerative joint disease)   . COPD (chronic obstructive pulmonary disease)   . Tobacco dependence 2000    low back  . Avascular necrosis   . Osteoporosis   . Substance abuse   . Neurofibromatosis   . Hyperplastic colonic polyp   . CHF (congestive heart failure)    . Abnormal weight loss   . Essential hypertension, benign   . Depressive disorder, not elsewhere classified   . Constipation   . Low back pain   . Anxiety   . Lung cancer 2012    LUL  . Shortness of breath   . Traumatic pneumothorax     history of  . History of radiation therapy 02/06/11 -02/17/11    LUL   Past Surgical History  Procedure Laterality Date  . Laminectomy  02/2003  . Back surgery      multiple  . Tubal ligation    . Vesicovaginal fistula closure w/ tah    . Cholecystectomy    . Tonsilectomy, adenoidectomy, bilateral myringotomy and tubes    . Hip surgery      right hip, metal plate   Family History  Problem Relation Age of Onset  . Lung cancer Father   . Emphysema Mother   . Neurofibromatosis Mother   . Liver disease     History  Substance Use Topics  . Smoking status: Current Some Day Smoker -- 0.50 packs/day for 35 years    Types: Cigarettes  . Smokeless tobacco: Never Used  . Alcohol Use: No   OB History   Grav Para Term Preterm Abortions TAB SAB Ect Mult Living                 Review of Systems  Constitutional: Negative  for fever, chills, activity change, appetite change and fatigue.  HENT: Negative for congestion, ear pain, mouth sores, rhinorrhea and sore throat.   Eyes: Negative for photophobia and visual disturbance.  Respiratory: Positive for cough, chest tightness and shortness of breath.   Cardiovascular: Positive for chest pain. Negative for leg swelling.  Gastrointestinal: Negative for nausea, vomiting, abdominal pain, diarrhea and constipation.  Genitourinary: Negative for dysuria.  Musculoskeletal: Positive for back pain (chronic). Negative for neck pain and neck stiffness.  Skin: Negative for color change and wound.  Neurological: Positive for syncope, weakness, light-headedness and headaches. Negative for dizziness.    Allergies  Penicillins  Home Medications   Current Outpatient Rx  Name  Route  Sig  Dispense  Refill  .  acetaminophen (TYLENOL) 500 MG tablet   Oral   Take 1,000 mg by mouth every 6 (six) hours as needed for pain.         Marland Kitchen ALPRAZolam (XANAX) 0.5 MG tablet   Oral   Take 0.5 mg by mouth every 6 (six) hours as needed for sleep or anxiety.         Marland Kitchen aspirin-acetaminophen-caffeine (EXCEDRIN MIGRAINE) 250-250-65 MG per tablet   Oral   Take 2 tablets by mouth every 6 (six) hours as needed for pain.         Marland Kitchen atorvastatin (LIPITOR) 10 MG tablet   Oral   Take 10 mg by mouth at bedtime.         Marland Kitchen azithromycin (ZITHROMAX) 250 MG tablet      Two tablets today and one daily for 4 days thereafter   6 tablet   0   . doxycycline (VIBRA-TABS) 100 MG tablet      take 2 today then 1 daily   8 tablet   0   . lisinopril (PRINIVIL,ZESTRIL) 40 MG tablet   Oral   Take 1 tablet (40 mg total) by mouth every evening.   30 tablet   5   . mirtazapine (REMERON) 15 MG tablet      Take one tablet by mouth at bedtime for rest   30 tablet   3   . oxyCODONE-acetaminophen (PERCOCET) 7.5-325 MG per tablet   Oral   Take 2 tablets by mouth every 6 (six) hours as needed for pain. For pain   240 tablet   0   . predniSONE (DELTASONE) 10 MG tablet      take 4 x 2 days, 3 x 2 days, 2 x 2 days, 1 x 2 days, then stop   20 tablet   0   . QUEtiapine (SEROQUEL) 200 MG tablet   Oral   Take 2 tablets (400 mg total) by mouth at bedtime.   60 tablet   3   . tiotropium (SPIRIVA HANDIHALER) 18 MCG inhalation capsule   Inhalation   Place 1 capsule (18 mcg total) into inhaler and inhale daily.   30 capsule   12    BP 129/82  Pulse 96  Temp(Src) 97.6 F (36.4 C) (Oral)  Resp 18  Ht 5\' 2"  (1.575 m)  Wt 125 lb (56.7 kg)  BMI 22.86 kg/m2  SpO2 99%  Filed Vitals:   05/25/13 0900 05/25/13 1430 05/25/13 1800 05/25/13 2020  BP: 98/61 109/68 124/72 140/77  Pulse: 97 98  104  Temp: 97.5 F (36.4 C) 97.2 F (36.2 C)  98.6 F (37 C)  TempSrc: Oral Oral  Oral  Resp: 20 20  20   Height:  Weight:      SpO2: 98% 95%  96%    Physical Exam  Nursing note and vitals reviewed. Constitutional: She is oriented to person, place, and time. She appears well-developed and well-nourished. No distress.  HENT:  Head: Normocephalic and atraumatic.  Right Ear: External ear normal.  Left Ear: External ear normal.  Nose: Nose normal.  Mouth/Throat: Oropharynx is clear and moist. No oropharyngeal exudate.  No tenderness to palpation to the scalp throughout. No palpable hematoma, lacerations, or step-offs to the scalp throughout  Eyes: Conjunctivae are normal. Pupils are equal, round, and reactive to light. Right eye exhibits no discharge. Left eye exhibits no discharge.  Neck: Normal range of motion. Neck supple.  No tenderness to palpation to the neck throughout  Cardiovascular: Normal rate, regular rhythm, normal heart sounds and intact distal pulses.  Exam reveals no gallop and no friction rub.   No murmur heard. Dorsalis pedis pulses present bilatearlly  Pulmonary/Chest: Effort normal and breath sounds normal. No respiratory distress. She has no wheezes. She has no rales. She exhibits no tenderness.  Diminished breath sounds throughout  Abdominal: Soft. She exhibits no distension. There is no tenderness.  Musculoskeletal: Normal range of motion. She exhibits no edema and no tenderness.  Strength 5/5 in the UE and LE. Ecchymosis to the right hand diffusely with no open wounds or lacerations. Diffuse tenderness to palpation to the right dorsal hand.  No right wrist, elbow, arm, or shoulder tenderness.    Neurological: She is alert and oriented to person, place, and time.  GCS 15. No focal neurological deficits. CN 2-12 intact. Sensation intact  Skin: Skin is warm and dry. She is not diaphoretic.  Neurofibromatosis throughout    ED Course  Procedures (including critical care time) Labs Review Labs Reviewed  CBC  BASIC METABOLIC PANEL  PRO B NATRIURETIC PEPTIDE   Imaging  Review Dg Chest 2 View  05/23/2013   CLINICAL DATA:  Shortness of breath.  EXAM: CHEST  2 VIEW  COMPARISON:  Chest radiograph 05/12/2013.  FINDINGS: Stable cardiac and mediastinal contours. Increasing irregular opacity within the left upper lung. Remainder of the lungs are clear without large consolidative pulmonary opacity. No definite pleural effusion or pneumothorax. Cholecystectomy clips. Regional skeleton is unremarkable.  IMPRESSION: 1. No definite acute cardiopulmonary process. 2. Slight increased prominence of irregular opacity within the left upper lobe which may represent scarring. Acute superimposed process is not excluded. Short-term followup radiograph is recommended to ensure stability or resolution.   Electronically Signed   By: Annia Belt M.D.   On: 05/23/2013 14:03    EKG Interpretation     Ventricular Rate:    PR Interval:    QRS Duration:   QT Interval:    QTC Calculation:   R Axis:     Text Interpretation:             Results for orders placed during the hospital encounter of 05/23/13  CBC      Result Value Range   WBC 5.8  4.0 - 10.5 K/uL   RBC 3.62 (*) 3.87 - 5.11 MIL/uL   Hemoglobin 11.7 (*) 12.0 - 15.0 g/dL   HCT 40.9 (*) 81.1 - 91.4 %   MCV 96.1  78.0 - 100.0 fL   MCH 32.3  26.0 - 34.0 pg   MCHC 33.6  30.0 - 36.0 g/dL   RDW 78.2  95.6 - 21.3 %   Platelets 215  150 - 400 K/uL  BASIC METABOLIC PANEL  Result Value Range   Sodium 145  135 - 145 mEq/L   Potassium 4.8  3.5 - 5.1 mEq/L   Chloride 109  96 - 112 mEq/L   CO2 28  19 - 32 mEq/L   Glucose, Bld 90  70 - 99 mg/dL   BUN 9  6 - 23 mg/dL   Creatinine, Ser 1.61  0.50 - 1.10 mg/dL   Calcium 9.0  8.4 - 09.6 mg/dL   GFR calc non Af Amer 89 (*) >90 mL/min   GFR calc Af Amer >90  >90 mL/min  PRO B NATRIURETIC PEPTIDE      Result Value Range   Pro B Natriuretic peptide (BNP) 635.7 (*) 0 - 125 pg/mL  POCT I-STAT TROPONIN I      Result Value Range   Troponin i, poc 0.00  0.00 - 0.08 ng/mL    Comment 3              DG Hand Complete Right (Final result)  Result time: 05/23/13 15:20:31    Final result by Rad Results In Interface (05/23/13 15:20:31)    Narrative:   CLINICAL DATA: Right hand pain.  EXAM: RIGHT HAND - COMPLETE 3+ VIEW  COMPARISON: None.  FINDINGS: There is no evidence of fracture or dislocation. There is no evidence of arthropathy or other focal bone abnormality. Soft tissues are unremarkable.  IMPRESSION: Normal right hand.   Electronically Signed By: Roque Lias M.D. On: 05/23/2013 15:20             DG Chest 2 View (Final result)  Result time: 05/23/13 14:03:07    Final result by Rad Results In Interface (05/23/13 14:03:07)    Narrative:   CLINICAL DATA: Shortness of breath.  EXAM: CHEST 2 VIEW  COMPARISON: Chest radiograph 05/12/2013.  FINDINGS: Stable cardiac and mediastinal contours. Increasing irregular opacity within the left upper lung. Remainder of the lungs are clear without large consolidative pulmonary opacity. No definite pleural effusion or pneumothorax. Cholecystectomy clips. Regional skeleton is unremarkable.  IMPRESSION: 1. No definite acute cardiopulmonary process. 2. Slight increased prominence of irregular opacity within the left upper lobe which may represent scarring. Acute superimposed process is not excluded. Short-term followup radiograph is recommended to ensure stability or resolution.   Electronically Signed By: Annia Belt M.D. On: 05/23/2013 14:03   CT Head Wo Contrast (Final result)  Result time: 05/23/13 15:51:48    Final result by Rad Results In Interface (05/23/13 15:51:48)    Narrative:   CLINICAL DATA: Decreased level of consciousness.  EXAM: CT HEAD WITHOUT CONTRAST  TECHNIQUE: Contiguous axial images were obtained from the base of the skull through the vertex without intravenous contrast.  COMPARISON: 07/28/2012.  FINDINGS: Stable area of skin thickening in the left parietal  area. There are also unchanged subcutaneous nodules. Stable cerebral atrophy, ventriculomegaly and periventricular white matter disease. No extra-axial fluid collections are identified. No CT findings for acute hemispheric infarction an or intracranial hemorrhage. No mass lesions. Stable small meningioma in the high right parietal area. The brainstem and cerebellum are grossly normal and stable.  No acute skull fracture. The paranasal sinuses and mastoid air cells are grossly clear. There is a small amount of mucoperiosteal thickening in the left half of the sphenoid sinus.  IMPRESSION: No acute intracranial findings or mass lesions.  Stable left-sided skin thickening and multiple scalp nodules.   Electronically Signed By: Loralie Champagne M.D. On: 05/23/2013 15:51     Date: 05/27/2013  Rate: 95  Rhythm: normal  sinus rhythm  QRS Axis: normal  Intervals: normal  ST/T Wave abnormalities: normal  Conduction Disutrbances:none  Narrative Interpretation:   Old EKG Reviewed: unchanged (05/13/13)    MDM   1. Syncope   2. Chronic airway obstruction, not elsewhere classified   3. Abscess of axilla, left   4. Essential hypertension, benign   5. Low back pain     [name] is a 62 y.o. female with a PMH of COPD, DJD, AVN, osteoporosis, substance abuse, neurofibromatosis, CHF, HTN, depression, constipation, low back pain, anxiety, lung cancer s/p radiation, SOB, traumatic pneumothorax who presents to the ED for evaluation of SOB, LOC, and headache.  Hand x-ray ordered due to ecchymosis to right hand.  CT head, chest x-ray, CBC, BMP, BNP, and troponin ordered to further evaluate.     4:00 PM = Signed out care to [employer] PA-C who will await results of imaging and labs.  Likely admit for further evaluation of syncope and SOB.       Luiz Iron PA-C   This patient was discussed with Dr. Candise Bowens, PA-C 05/27/13 1113

## 2013-05-23 NOTE — H&P (Signed)
[facility] History and Physical  NARE GASPARI ZOX:[id number] DOB: 14-Jul-1951 DOA: 05/23/2013  Referring physician: ED PCP: Bufford Spikes, DO  Chief Complaint: Syncope  HPI: [name] is a 62 y.o. female with h/o COPD on 5L O2 at home at baseline, she presents to the ED with c/o three episodes of syncope earlier today.  At least 2 out of the 3 episodes did occur after going from a seated to a standing position.  One occurred after getting up from a chair to go to the fridge, another occurred after getting up out of bed.  She notes that recently she has had to sit still for a few mins because "the room spins" when ever she goes from a lying to a seated position on her bed, this before going to a standing position.  She was wearing her O2 during all episodes.  She has had somewhat of a productive cough recently, no fevers, no chills.  Diffuse chest tightness worse with coughing and deep breathing.  In the ED CT scan of her head was negative, CTA chest showed no PE.  Trop negative, hospitalist asked to admit for syncope work up.  Review of Systems: 12 systems reviewed and otherwise negative.  Past Medical History  Diagnosis Date  . DJD (degenerative joint disease)   . COPD (chronic obstructive pulmonary disease)   . Tobacco dependence 2000    low back  . Avascular necrosis   . Osteoporosis   . Substance abuse   . Neurofibromatosis   . Hyperplastic colonic polyp   . CHF (congestive heart failure)   . Abnormal weight loss   . Essential hypertension, benign   . Depressive disorder, not elsewhere classified   . Constipation   . Low back pain   . Anxiety   . Lung cancer 2012    LUL  . Shortness of breath   . Traumatic pneumothorax     history of  . History of radiation therapy 02/06/11 -02/17/11    LUL   Past Surgical History  Procedure Laterality Date  . Laminectomy  02/2003  . Back surgery      multiple  . Tubal ligation    . Vesicovaginal fistula closure w/ tah     . Cholecystectomy    . Tonsilectomy, adenoidectomy, bilateral myringotomy and tubes    . Hip surgery      right hip, metal plate   Social History:  reports that she has been smoking Cigarettes.  She has a 17.5 pack-year smoking history. She has never used smokeless tobacco. She reports that she does not drink alcohol or use illicit drugs.   Allergies  Allergen Reactions  . Penicillins Rash    Family History  Problem Relation Age of Onset  . Lung cancer Father   . Emphysema Mother   . Neurofibromatosis Mother   . Liver disease     Prior to Admission medications   Medication Sig Start Date End Date Taking? Authorizing Provider  acetaminophen (TYLENOL) 500 MG tablet Take 1,000 mg by mouth every 6 (six) hours as needed for pain.   Yes Historical Provider, MD  ALPRAZolam Prudy Feeler) 0.5 MG tablet Take 0.5 mg by mouth every 6 (six) hours as needed for sleep or anxiety.   Yes Historical Provider, MD  aspirin-acetaminophen-caffeine (EXCEDRIN MIGRAINE) [phone] MG per tablet Take 2 tablets by mouth every 6 (six) hours as needed for pain.   Yes Historical Provider, MD  atorvastatin (LIPITOR) 10 MG tablet Take 10 mg by  mouth at bedtime.   Yes Historical Provider, MD  lisinopril (PRINIVIL,ZESTRIL) 40 MG tablet Take 1 tablet (40 mg total) by mouth every evening. 02/12/13  Yes Kimber Relic, MD  mirtazapine (REMERON) 15 MG tablet Take 15 mg by mouth at bedtime. Take one tablet by mouth at bedtime for rest 12/16/12  Yes Tiffany L Reed, DO  oxyCODONE-acetaminophen (PERCOCET) 7.5-325 MG per tablet Take 1 tablet by mouth every 6 (six) hours as needed for pain. For pain 05/09/13  Yes Tiffany L Reed, DO  QUEtiapine (SEROQUEL) 200 MG tablet Take 2 tablets (400 mg total) by mouth at bedtime. 03/03/13  Yes Kermit Balo, DO   Physical Exam: Filed Vitals:   05/23/13 2102  BP: 148/87  Pulse: 101  Temp:   Resp:     General:  NAD, resting comfortably in bed Eyes: PEERLA EOMI ENT: mucous membranes  moist Neck: supple w/o JVD Cardiovascular: RRR w/o MRG Respiratory: CTA B Abdomen: soft, nt, nd, bs+ Skin: neurofibromatosis Musculoskeletal: MAE, full ROM all 4 extremities Psychiatric: normal tone and affect Neurologic: AAOx3, grossly non-focal  Labs on Admission:  Basic Metabolic Panel:  Recent Labs Lab 05/23/13 1320  NA 145  K 4.8  CL 109  CO2 28  GLUCOSE 90  BUN 9  CREATININE 0.71  CALCIUM 9.0   Liver Function Tests: No results found for this basename: AST, ALT, ALKPHOS, BILITOT, PROT, ALBUMIN,  in the last 168 hours No results found for this basename: LIPASE, AMYLASE,  in the last 168 hours No results found for this basename: AMMONIA,  in the last 168 hours CBC:  Recent Labs Lab 05/23/13 1320  WBC 5.8  HGB 11.7*  HCT 34.8*  MCV 96.1  PLT 215   Cardiac Enzymes: No results found for this basename: CKTOTAL, CKMB, CKMBINDEX, TROPONINI,  in the last 168 hours  BNP (last 3 results)  Recent Labs  11/04/12 2309 05/23/13 1320  PROBNP 162.4* 635.7*   CBG: No results found for this basename: GLUCAP,  in the last 168 hours  Radiological Exams on Admission: Dg Chest 2 View  05/23/2013   CLINICAL DATA:  Shortness of breath.  EXAM: CHEST  2 VIEW  COMPARISON:  Chest radiograph 05/12/2013.  FINDINGS: Stable cardiac and mediastinal contours. Increasing irregular opacity within the left upper lung. Remainder of the lungs are clear without large consolidative pulmonary opacity. No definite pleural effusion or pneumothorax. Cholecystectomy clips. Regional skeleton is unremarkable.  IMPRESSION: 1. No definite acute cardiopulmonary process. 2. Slight increased prominence of irregular opacity within the left upper lobe which may represent scarring. Acute superimposed process is not excluded. Short-term followup radiograph is recommended to ensure stability or resolution.   Electronically Signed   By: Annia Belt M.D.   On: 05/23/2013 14:03   Ct Head Wo Contrast  05/23/2013    CLINICAL DATA:  Decreased level of consciousness.  EXAM: CT HEAD WITHOUT CONTRAST  TECHNIQUE: Contiguous axial images were obtained from the base of the skull through the vertex without intravenous contrast.  COMPARISON:  07/28/2012.  FINDINGS: Stable area of skin thickening in the left parietal area. There are also unchanged subcutaneous nodules. Stable cerebral atrophy, ventriculomegaly and periventricular white matter disease. No extra-axial fluid collections are identified. No CT findings for acute hemispheric infarction an or intracranial hemorrhage. No mass lesions. Stable small meningioma in the high right parietal area. The brainstem and cerebellum are grossly normal and stable.  No acute skull fracture. The paranasal sinuses and mastoid air cells are  grossly clear. There is a small amount of mucoperiosteal thickening in the left half of the sphenoid sinus.  IMPRESSION: No acute intracranial findings or mass lesions.  Stable left-sided skin thickening and multiple scalp nodules.   Electronically Signed   By: Loralie Champagne M.D.   On: 05/23/2013 15:51   Ct Angio Chest Pe W/cm &/or Wo Cm  05/23/2013   CLINICAL DATA:  Shortness of breath.  EXAM: CT ANGIOGRAPHY CHEST WITH CONTRAST  TECHNIQUE: Multidetector CT imaging of the chest was performed using the standard protocol during bolus administration of intravenous contrast. Multiplanar CT image reconstructions including MIPs were obtained to evaluate the vascular anatomy.  CONTRAST:  OMNIPAQUE IOHEXOL 350 MG/ML SOLN  COMPARISON:  11/18/2012.  FINDINGS: The chest wall is unremarkable and stable. No breast masses, supraclavicular or axillary adenopathy. Small scattered lymph nodes are noted. A rim calcified left thyroid gland cyst is again noted. The bony thorax is intact. Severe left convex thoracic scoliosis and osteoporosis. A spinal cord stimulator is noted.  The heart is normal in size and stable. Moderate pericardial an epicardial fat but no  pericardial effusion. No mediastinal or hilar mass or adenopathy. The aorta is normal in caliber. Mild stable atherosclerotic calcifications. The esophagus is grossly normal.  The pulmonary arterial tree is well opacified. No filling defects to suggest pulmonary embolism.  Examination of the lung parenchyma demonstrates stable advanced emphysematous changes and pulmonary scarring. There is a progressive area of pleural thickening and adjacent parenchymal airspace opacity in the apical posterior segment of the left upper lobe. This has a E bleeding year configuration on the sagittal images and I think it is most likely an area progressive scarring with retraction. Recommend continued followup with a repeat noncontrast chest CT in in 4 months. If this progresses PET-CT or biopsy may be indicated.  The upper abdomen is unremarkable.  Review of the MIP images confirms the above findings.  IMPRESSION: No CT findings for pulmonary embolism.  Slightly progressive area of pleural and parenchymal density in the apical posterior segment of the left upper lobe. Recommend continued observation with a repeat noncontrast chest CT in 4 months. PET-CT or biopsy may be indicated if this continues to show progression.  Advanced emphysematous changes.   Electronically Signed   By: Loralie Champagne M.D.   On: 05/23/2013 19:52   Dg Hand Complete Right  05/23/2013   CLINICAL DATA:  Right hand pain.  EXAM: RIGHT HAND - COMPLETE 3+ VIEW  COMPARISON:  None.  FINDINGS: There is no evidence of fracture or dislocation. There is no evidence of arthropathy or other focal bone abnormality. Soft tissues are unremarkable.  IMPRESSION: Normal right hand.   Electronically Signed   By: Roque Lias M.D.   On: 05/23/2013 15:20    EKG: Independently reviewed. NSR  Assessment/Plan Principal Problem:   Syncope Active Problems:   CHRONIC OBSTRUCTIVE PULMONARY DISEASE   1. Syncope - orthostatic vitals are negative despite the association  between standing up and syncope, admitting patient on tele monitor, 2d echo ordered.  Also wish to check ambulatory O2 sat as hypoxia could certainly cause this.  Given her chronically normal bicarb with normal kidney function, CO2 retention is lower on the differential.  CT head was negative for any sort of mass lesion.  Patient does have a known history of Lung CA Stage 1 in remission for the past 2 years s/p radiation treatment, and as a result will go ahead and order MRI brain in an  effort to more fully exclude CNS metastatic disease.  DDx also include CNS depressant use (patient on chronic narcotics and xanax at home). 2. COPD - sounds mostly stable, will go ahead and put patient on adult wheeze protocol while here    Code Status: DNR (must indicate code status--if unknown or must be presumed, indicate so) Family Communication: no family in room (indicate person spoken with, if applicable, with phone number if by telephone) Disposition Plan: Admit to obs (indicate anticipated LOS)  Time spent: 70 min  Lachlyn Vanderstelt M. [facility] Pager [phone]  If 7PM-7AM, please contact night-coverage [email or web address] Password [email or web address] 05/23/2013, 9:40 PM

## 2013-05-23 NOTE — ED Notes (Signed)
Admitting physician at bedside

## 2013-05-24 DIAGNOSIS — IMO0002 Reserved for concepts with insufficient information to code with codable children: Secondary | ICD-10-CM

## 2013-05-24 DIAGNOSIS — I369 Nonrheumatic tricuspid valve disorder, unspecified: Secondary | ICD-10-CM

## 2013-05-24 DIAGNOSIS — I10 Essential (primary) hypertension: Secondary | ICD-10-CM

## 2013-05-24 MED ORDER — MORPHINE SULFATE 2 MG/ML IJ SOLN
1.0000 mg | INTRAMUSCULAR | Status: DC | PRN
  Administered 2013-05-24: 1 mg via INTRAVENOUS
  Filled 2013-05-24: qty 1

## 2013-05-24 MED ORDER — OXYCODONE HCL 5 MG PO TABS
2.5000 mg | ORAL_TABLET | ORAL | Status: DC | PRN
  Administered 2013-05-24 – 2013-05-27 (×11): 2.5 mg via ORAL
  Filled 2013-05-24 (×11): qty 1

## 2013-05-24 MED ORDER — OXYCODONE-ACETAMINOPHEN 5-325 MG PO TABS
1.0000 | ORAL_TABLET | ORAL | Status: DC | PRN
  Administered 2013-05-24 – 2013-05-27 (×11): 1 via ORAL
  Filled 2013-05-24 (×11): qty 1

## 2013-05-24 NOTE — Progress Notes (Signed)
Pt c/o pain at 750am, PRN pain meds given as ordered, pt then c/o anxiety and PRN xanax given, pt resting comfortably in bed, will continue to monitor

## 2013-05-24 NOTE — ED Provider Notes (Signed)
Medical screening examination/treatment/procedure(s) were performed by non-physician practitioner and as supervising physician I was immediately available for consultation/collaboration.  Lyanne Co, MD 05/24/13 [phone]

## 2013-05-24 NOTE — Progress Notes (Signed)
[facility] PROGRESS NOTE  [name] RUE:[id number] DOB: 07/17/1951 DOA: 05/23/2013 PCP: Bufford Spikes, DO Brief HPI: [name] is a 62 y.o. female with h/o COPD on 5L O2 at home at baseline, she presents to the ED with c/o three episodes of syncope earlier today. At least 2 out of the 3 episodes did occur after going from a seated to a standing position. One occurred after getting up from a chair to go to the fridge, another occurred after getting up out of bed. She notes that recently she has had to sit still for a few mins because "the room spins" when ever she goes from a lying to a seated position on her bed, this before going to a standing position. She is admitted to hospitalist service for further evaluation.   Assessment/Plan: Syncope: possibly from orthostatic hypotension,.  - admitted to telemetry.  - serial cardiac markers, echo and EKG . Initial EKG show NSR - PT eval.    2. Lung cancer in remission:  - initial CT head negative for metastatic disease.  - MRI brain could not be done because of the stimulater.   3. COPD:  - NEBS AS needed.   4. DVT prophylaxis.   Code Status: DNR Family Communication: None at bedside Disposition Plan: remains inpatient   Consultants:  none  Procedures:  Echo pendng  Antibiotics:  none  HPI/Subjective: Reports being tired, sleeping with breakfast in front of her.   Objective: Filed Vitals:   05/24/13 0636  BP: 101/53  Pulse: 102  Temp: 98.2 F (36.8 C)  Resp: 22    Intake/Output Summary (Last 24 hours) at 05/24/13 0915 Last data filed at 05/23/13 2258  Gross per 24 hour  Intake    240 ml  Output      0 ml  Net    240 ml   Filed Weights   05/23/13 1318 05/23/13 2254  Weight: 56.7 kg (125 lb) 56.7 kg (125 lb)    Exam:  Cardiovascular: RRR w/o MRG  Respiratory: CTA B  Abdomen: soft, nt, nd, bs+  Skin: neurofibromatosis  Musculoskeletal: MAE, full ROM all 4 extremities     Data  Reviewed: Basic Metabolic Panel:  Recent Labs Lab 05/23/13 1320  NA 145  K 4.8  CL 109  CO2 28  GLUCOSE 90  BUN 9  CREATININE 0.71  CALCIUM 9.0   Liver Function Tests: No results found for this basename: AST, ALT, ALKPHOS, BILITOT, PROT, ALBUMIN,  in the last 168 hours No results found for this basename: LIPASE, AMYLASE,  in the last 168 hours No results found for this basename: AMMONIA,  in the last 168 hours CBC:  Recent Labs Lab 05/23/13 1320  WBC 5.8  HGB 11.7*  HCT 34.8*  MCV 96.1  PLT 215   Cardiac Enzymes: No results found for this basename: CKTOTAL, CKMB, CKMBINDEX, TROPONINI,  in the last 168 hours BNP (last 3 results)  Recent Labs  11/04/12 2309 05/23/13 1320  PROBNP 162.4* 635.7*   CBG: No results found for this basename: GLUCAP,  in the last 168 hours  No results found for this or any previous visit (from the past 240 hour(s)).   Studies: Dg Chest 2 View  05/23/2013   CLINICAL DATA:  Shortness of breath.  EXAM: CHEST  2 VIEW  COMPARISON:  Chest radiograph 05/12/2013.  FINDINGS: Stable cardiac and mediastinal contours. Increasing irregular opacity within the left upper lung. Remainder of the lungs are clear without large  consolidative pulmonary opacity. No definite pleural effusion or pneumothorax. Cholecystectomy clips. Regional skeleton is unremarkable.  IMPRESSION: 1. No definite acute cardiopulmonary process. 2. Slight increased prominence of irregular opacity within the left upper lobe which may represent scarring. Acute superimposed process is not excluded. Short-term followup radiograph is recommended to ensure stability or resolution.   Electronically Signed   By: Annia Belt M.D.   On: 05/23/2013 14:03   Ct Head Wo Contrast  05/23/2013   CLINICAL DATA:  Decreased level of consciousness.  EXAM: CT HEAD WITHOUT CONTRAST  TECHNIQUE: Contiguous axial images were obtained from the base of the skull through the vertex without intravenous contrast.   COMPARISON:  07/28/2012.  FINDINGS: Stable area of skin thickening in the left parietal area. There are also unchanged subcutaneous nodules. Stable cerebral atrophy, ventriculomegaly and periventricular white matter disease. No extra-axial fluid collections are identified. No CT findings for acute hemispheric infarction an or intracranial hemorrhage. No mass lesions. Stable small meningioma in the high right parietal area. The brainstem and cerebellum are grossly normal and stable.  No acute skull fracture. The paranasal sinuses and mastoid air cells are grossly clear. There is a small amount of mucoperiosteal thickening in the left half of the sphenoid sinus.  IMPRESSION: No acute intracranial findings or mass lesions.  Stable left-sided skin thickening and multiple scalp nodules.   Electronically Signed   By: Loralie Champagne M.D.   On: 05/23/2013 15:51   Ct Angio Chest Pe W/cm &/or Wo Cm  05/23/2013   CLINICAL DATA:  Shortness of breath.  EXAM: CT ANGIOGRAPHY CHEST WITH CONTRAST  TECHNIQUE: Multidetector CT imaging of the chest was performed using the standard protocol during bolus administration of intravenous contrast. Multiplanar CT image reconstructions including MIPs were obtained to evaluate the vascular anatomy.  CONTRAST:  OMNIPAQUE IOHEXOL 350 MG/ML SOLN  COMPARISON:  11/18/2012.  FINDINGS: The chest wall is unremarkable and stable. No breast masses, supraclavicular or axillary adenopathy. Small scattered lymph nodes are noted. A rim calcified left thyroid gland cyst is again noted. The bony thorax is intact. Severe left convex thoracic scoliosis and osteoporosis. A spinal cord stimulator is noted.  The heart is normal in size and stable. Moderate pericardial an epicardial fat but no pericardial effusion. No mediastinal or hilar mass or adenopathy. The aorta is normal in caliber. Mild stable atherosclerotic calcifications. The esophagus is grossly normal.  The pulmonary arterial tree is well  opacified. No filling defects to suggest pulmonary embolism.  Examination of the lung parenchyma demonstrates stable advanced emphysematous changes and pulmonary scarring. There is a progressive area of pleural thickening and adjacent parenchymal airspace opacity in the apical posterior segment of the left upper lobe. This has a E bleeding year configuration on the sagittal images and I think it is most likely an area progressive scarring with retraction. Recommend continued followup with a repeat noncontrast chest CT in in 4 months. If this progresses PET-CT or biopsy may be indicated.  The upper abdomen is unremarkable.  Review of the MIP images confirms the above findings.  IMPRESSION: No CT findings for pulmonary embolism.  Slightly progressive area of pleural and parenchymal density in the apical posterior segment of the left upper lobe. Recommend continued observation with a repeat noncontrast chest CT in 4 months. PET-CT or biopsy may be indicated if this continues to show progression.  Advanced emphysematous changes.   Electronically Signed   By: Loralie Champagne M.D.   On: 05/23/2013 19:52   Dg  Hand Complete Right  05/23/2013   CLINICAL DATA:  Right hand pain.  EXAM: RIGHT HAND - COMPLETE 3+ VIEW  COMPARISON:  None.  FINDINGS: There is no evidence of fracture or dislocation. There is no evidence of arthropathy or other focal bone abnormality. Soft tissues are unremarkable.  IMPRESSION: Normal right hand.   Electronically Signed   By: Roque Lias M.D.   On: 05/23/2013 15:20    Scheduled Meds: . antiseptic oral rinse  15 mL Mouth Rinse BID  . atorvastatin  10 mg Oral QHS  . heparin  5,000 Units Subcutaneous Q8H  . lisinopril  40 mg Oral QPM  . mirtazapine  15 mg Oral QHS  . QUEtiapine  400 mg Oral QHS  . sodium chloride  3 mL Intravenous Q12H   Continuous Infusions:   Principal Problem:   Syncope Active Problems:   CHRONIC OBSTRUCTIVE PULMONARY DISEASE    Time spent: 25 m  in    [facility]  [facility] Pager [phone]. If 7PM-7AM, please contact night-coverage at [email or web address], password [facility] 05/24/2013, 9:15 AM  LOS: 1 day

## 2013-05-24 NOTE — Progress Notes (Signed)
Pt admitted to rm 4E26 from ED, oriented to room, call bell placed within reach, admission assessment done, orders carried out.

## 2013-05-24 NOTE — Progress Notes (Signed)
Echocardiogram 2D Echocardiogram has been performed.  [name] 05/24/2013, 3:40 PM

## 2013-05-25 DIAGNOSIS — M545 Low back pain, unspecified: Secondary | ICD-10-CM

## 2013-05-25 MED ORDER — LEVALBUTEROL HCL 0.63 MG/3ML IN NEBU
0.6300 mg | INHALATION_SOLUTION | Freq: Four times a day (QID) | RESPIRATORY_TRACT | Status: DC | PRN
  Administered 2013-05-27: 0.63 mg via RESPIRATORY_TRACT
  Filled 2013-05-25: qty 3

## 2013-05-25 MED ORDER — ACETAMINOPHEN 500 MG PO TABS: 500.0000 mg | ORAL_TABLET | Freq: Four times a day (QID) | ORAL | Status: DC | PRN

## 2013-05-25 MED ORDER — TRAMADOL HCL 50 MG PO TABS: 50.0000 mg | ORAL_TABLET | Freq: Four times a day (QID) | ORAL | Status: DC | PRN

## 2013-05-25 NOTE — Progress Notes (Signed)
Pt. Requesting breathing treatment. Pt. Having some expiratory wheezing upon assessment.  On call for [facility], T. Claiborne Billings, NP, made aware. New orders received. RN will implement as ordered and continue to monitor pt. Davione Lenker, Cheryll Dessert

## 2013-05-25 NOTE — Progress Notes (Signed)
[facility] PROGRESS NOTE  SHIRLEYMAE HAUTH ION:[id number] DOB: 1951-02-26 DOA: 05/23/2013 PCP: Bufford Spikes, DO Brief HPI: [name] is a 62 y.o. female with h/o COPD on 5L O2 at home at baseline, she presents to the ED with c/o three episodes of syncope earlier today. At least 2 out of the 3 episodes did occur after going from a seated to a standing position. One occurred after getting up from a chair to go to the fridge, another occurred after getting up out of bed. She notes that recently she has had to sit still for a few mins because "the room spins" when ever she goes from a lying to a seated position on her bed, this before going to a standing position. She is admitted to hospitalist service for further evaluation.   Assessment/Plan: Syncope: possibly from orthostatic hypotension,.  - admitted to telemetry.  - serial cardiac markers, echo and EKG . Initial EKG show NSR - PT eval.  - echocardiogram done and pending.    2. Lung cancer in remission:  - initial CT head negative for metastatic disease.  - MRI brain could not be done because of the stimulater.   3. COPD:  - NEBS AS needed.   4. DVT prophylaxis.   5. Back pain; pain meds and pt.   Code Status: DNR Family Communication: None at bedside Disposition Plan: remains inpatient   Consultants:  none  Procedures:  Echo pendng  Antibiotics:  none  HPI/Subjective:  Reports back pain. Objective: Filed Vitals:   05/25/13 0900  BP: 98/61  Pulse: 97  Temp: 97.5 F (36.4 C)  Resp: 20    Intake/Output Summary (Last 24 hours) at 05/25/13 1235 Last data filed at 05/25/13 1109  Gross per 24 hour  Intake    483 ml  Output      0 ml  Net    483 ml   Filed Weights   05/23/13 1318 05/23/13 2254 05/25/13 0458  Weight: 56.7 kg (125 lb) 56.7 kg (125 lb) 56.5 kg (124 lb 9 oz)    Exam:  Cardiovascular: RRR w/o MRG  Respiratory: CTA B  Abdomen: soft, nt, nd, bs+  Skin: neurofibromatosis  Musculoskeletal: MAE, full ROM all 4 extremities     Data Reviewed: Basic Metabolic Panel:  Recent Labs Lab 05/23/13 1320  NA 145  K 4.8  CL 109  CO2 28  GLUCOSE 90  BUN 9  CREATININE 0.71  CALCIUM 9.0   Liver Function Tests: No results found for this basename: AST, ALT, ALKPHOS, BILITOT, PROT, ALBUMIN,  in the last 168 hours No results found for this basename: LIPASE, AMYLASE,  in the last 168 hours No results found for this basename: AMMONIA,  in the last 168 hours CBC:  Recent Labs Lab 05/23/13 1320  WBC 5.8  HGB 11.7*  HCT 34.8*  MCV 96.1  PLT 215   Cardiac Enzymes: No results found for this basename: CKTOTAL, CKMB, CKMBINDEX, TROPONINI,  in the last 168 hours BNP (last 3 results)  Recent Labs  11/04/12 2309 05/23/13 1320  PROBNP 162.4* 635.7*   CBG: No results found for this basename: GLUCAP,  in the last 168 hours  No results found for this or any previous visit (from the past 240 hour(s)).   Studies: Dg Chest 2 View  05/23/2013   CLINICAL DATA:  Shortness of breath.  EXAM: CHEST  2 VIEW  COMPARISON:  Chest radiograph 05/12/2013.  FINDINGS: Stable cardiac and mediastinal contours. Increasing irregular  opacity within the left upper lung. Remainder of the lungs are clear without large consolidative pulmonary opacity. No definite pleural effusion or pneumothorax. Cholecystectomy clips. Regional skeleton is unremarkable.  IMPRESSION: 1. No definite acute cardiopulmonary process. 2. Slight increased prominence of irregular opacity within the left upper lobe which may represent scarring. Acute superimposed process is not excluded. Short-term followup radiograph is recommended to ensure stability or resolution.   Electronically Signed   By: Annia Belt M.D.   On: 05/23/2013 14:03   Ct Head Wo Contrast  05/23/2013   CLINICAL DATA:  Decreased level of consciousness.  EXAM: CT HEAD WITHOUT CONTRAST  TECHNIQUE: Contiguous axial images were obtained from the base of  the skull through the vertex without intravenous contrast.  COMPARISON:  07/28/2012.  FINDINGS: Stable area of skin thickening in the left parietal area. There are also unchanged subcutaneous nodules. Stable cerebral atrophy, ventriculomegaly and periventricular white matter disease. No extra-axial fluid collections are identified. No CT findings for acute hemispheric infarction an or intracranial hemorrhage. No mass lesions. Stable small meningioma in the high right parietal area. The brainstem and cerebellum are grossly normal and stable.  No acute skull fracture. The paranasal sinuses and mastoid air cells are grossly clear. There is a small amount of mucoperiosteal thickening in the left half of the sphenoid sinus.  IMPRESSION: No acute intracranial findings or mass lesions.  Stable left-sided skin thickening and multiple scalp nodules.   Electronically Signed   By: Loralie Champagne M.D.   On: 05/23/2013 15:51   Ct Angio Chest Pe W/cm &/or Wo Cm  05/23/2013   CLINICAL DATA:  Shortness of breath.  EXAM: CT ANGIOGRAPHY CHEST WITH CONTRAST  TECHNIQUE: Multidetector CT imaging of the chest was performed using the standard protocol during bolus administration of intravenous contrast. Multiplanar CT image reconstructions including MIPs were obtained to evaluate the vascular anatomy.  CONTRAST:  OMNIPAQUE IOHEXOL 350 MG/ML SOLN  COMPARISON:  11/18/2012.  FINDINGS: The chest wall is unremarkable and stable. No breast masses, supraclavicular or axillary adenopathy. Small scattered lymph nodes are noted. A rim calcified left thyroid gland cyst is again noted. The bony thorax is intact. Severe left convex thoracic scoliosis and osteoporosis. A spinal cord stimulator is noted.  The heart is normal in size and stable. Moderate pericardial an epicardial fat but no pericardial effusion. No mediastinal or hilar mass or adenopathy. The aorta is normal in caliber. Mild stable atherosclerotic calcifications. The esophagus  is grossly normal.  The pulmonary arterial tree is well opacified. No filling defects to suggest pulmonary embolism.  Examination of the lung parenchyma demonstrates stable advanced emphysematous changes and pulmonary scarring. There is a progressive area of pleural thickening and adjacent parenchymal airspace opacity in the apical posterior segment of the left upper lobe. This has a E bleeding year configuration on the sagittal images and I think it is most likely an area progressive scarring with retraction. Recommend continued followup with a repeat noncontrast chest CT in in 4 months. If this progresses PET-CT or biopsy may be indicated.  The upper abdomen is unremarkable.  Review of the MIP images confirms the above findings.  IMPRESSION: No CT findings for pulmonary embolism.  Slightly progressive area of pleural and parenchymal density in the apical posterior segment of the left upper lobe. Recommend continued observation with a repeat noncontrast chest CT in 4 months. PET-CT or biopsy may be indicated if this continues to show progression.  Advanced emphysematous changes.   Electronically Signed  By: Loralie Champagne M.D.   On: 05/23/2013 19:52   Dg Hand Complete Right  05/23/2013   CLINICAL DATA:  Right hand pain.  EXAM: RIGHT HAND - COMPLETE 3+ VIEW  COMPARISON:  None.  FINDINGS: There is no evidence of fracture or dislocation. There is no evidence of arthropathy or other focal bone abnormality. Soft tissues are unremarkable.  IMPRESSION: Normal right hand.   Electronically Signed   By: Roque Lias M.D.   On: 05/23/2013 15:20    Scheduled Meds: . antiseptic oral rinse  15 mL Mouth Rinse BID  . atorvastatin  10 mg Oral QHS  . heparin  5,000 Units Subcutaneous Q8H  . lisinopril  40 mg Oral QPM  . mirtazapine  15 mg Oral QHS  . QUEtiapine  400 mg Oral QHS  . sodium chloride  3 mL Intravenous Q12H   Continuous Infusions:   Principal Problem:   Syncope Active Problems:   CHRONIC  OBSTRUCTIVE PULMONARY DISEASE    Time spent: 25 m in    [facility]  [facility] Pager [phone]. If 7PM-7AM, please contact night-coverage at [email or web address], password [facility] 05/25/2013, 12:35 PM  LOS: 2 days

## 2013-05-26 NOTE — Progress Notes (Signed)
Patient has [facility] telephonic care management currently managing her lung disease.  Notified [facility] Liaison of admission.  [facility] will not actively engage at this time.  Of note, [facility] Care Management services does not replace or interfere with any services that are arranged by inpatient case management or social work.  For additional questions or referrals please contact Anibal Henderson BSN RN [facility] Liaison at [phone].

## 2013-05-26 NOTE — Evaluation (Signed)
Physical Therapy Evaluation Patient Details Name: [name] MRN: [id number] DOB: 23-Apr-1951 Today's Date: 05/26/2013 Time: 4098-1191 PT Time Calculation (min): 22 min  PT Assessment / Plan / Recommendation History of Present Illness  [name] is a 62 y.o. female with h/o COPD on 5L O2 at home at baseline, she presents to the ED with c/o three episodes of syncope earlier today. At least 2 out of the 3 episodes did occur after going from a seated to a standing position. One occurred after getting up from a chair to go to the fridge, another occurred after getting up out of bed. She notes that recently she has had to sit still for a few mins because "the room spins" when ever she goes from a lying to a seated position on her bed, this before going to a standing position  Clinical Impression  Pt with below deficits and will benefit from acute therapy to maximize gait, function and strength to decrease burden of care at discharge. Pt reports she and spouse have had increasing difficulty trying to care for themselves and help each other. Pt states: "its the blind leading the blind". Pt able to maintain sats 92-93% on 5L throughout.      PT Assessment  Patient needs continued PT services    Follow Up Recommendations  Home health PT;Other (comment) (aide)    Does the patient have the potential to tolerate intense rehabilitation      Barriers to Discharge Decreased caregiver support      Equipment Recommendations  None recommended by PT    Recommendations for Other Services     Frequency Min 3X/week    Precautions / Restrictions Precautions Precautions: Fall Precaution Comments: O2   Pertinent Vitals/Pain 7/10 HA VSS     Mobility  Bed Mobility Bed Mobility: Supine to Sit Supine to Sit: 6: Modified independent (Device/Increase time);With rails;HOB flat Details for Bed Mobility Assistance: increased time to complete Transfers Transfers: Sit to Stand;Stand to Sit Sit to  Stand: 5: Supervision;From bed Stand to Sit: 5: Supervision;To chair/3-in-1;With armrests Details for Transfer Assistance: cueing for hand placement and safety Ambulation/Gait Ambulation/Gait Assistance: 5: Supervision Ambulation Distance (Feet): 100 Feet Assistive device: Rolling walker Gait Pattern: Step-through pattern;Trunk flexed;Decreased stride length Gait velocity: cueing for posture and position in RW with distance limited by fatigue Stairs: No    Exercises     PT Diagnosis: Difficulty walking;Generalized weakness  PT Problem List: Decreased activity tolerance;Decreased mobility;Decreased strength;Decreased knowledge of use of DME PT Treatment Interventions: Gait training;Therapeutic exercise;Therapeutic activities;DME instruction;Functional mobility training;Patient/family education     PT Goals(Current goals can be found in the care plan section) Acute Rehab PT Goals Patient Stated Goal: have some help at home PT Goal Formulation: With patient Time For Goal Achievement: 06/09/13 Potential to Achieve Goals: Good  Visit Information  Last PT Received On: 05/26/13 Assistance Needed: +1 History of Present Illness: [name] is a 62 y.o. female with h/o COPD on 5L O2 at home at baseline, she presents to the ED with c/o three episodes of syncope earlier today. At least 2 out of the 3 episodes did occur after going from a seated to a standing position. One occurred after getting up from a chair to go to the fridge, another occurred after getting up out of bed. She notes that recently she has had to sit still for a few mins because "the room spins" when ever she goes from a lying to a seated position on her  bed, this before going to a standing position       Prior Functioning  Home Living Family/patient expects to be discharged to:: Private residence Living Arrangements: Spouse/significant other Available Help at Discharge: Available PRN/intermittently Type of Home:  House Home Access: Ramped entrance Home Layout: One level Home Equipment: Wheelchair - [employer] - 4 wheels;Shower seat;Cane - quad Prior Function Level of Independence: Needs assistance ADL's / Homemaking Assistance Needed: spouse does all the housework and has been helping with bathing  Communication Communication: No difficulties    Cognition  Cognition Arousal/Alertness: Awake/alert Behavior During Therapy: WFL for tasks assessed/performed Overall Cognitive Status: Within Functional Limits for tasks assessed    Extremity/Trunk Assessment Upper Extremity Assessment Upper Extremity Assessment: Generalized weakness Lower Extremity Assessment Lower Extremity Assessment: Generalized weakness Cervical / Trunk Assessment Cervical / Trunk Assessment: Kyphotic   Balance    End of Session PT - End of Session Equipment Utilized During Treatment: Gait belt Activity Tolerance: Patient limited by fatigue Patient left: in chair;with call bell/phone within reach Nurse Communication: Mobility status  GP Functional Assessment Tool Used: clinical judgement Functional Limitation: Mobility: Walking and moving around Mobility: Walking and Moving Around Current Status ([id number]): At least 1 percent but less than 20 percent impaired, limited or restricted Mobility: Walking and Moving Around Goal Status [phone]): 0 percent impaired, limited or restricted   Delorse Lek 05/26/2013, 1:23 PM Delaney Meigs, PT [phone]

## 2013-05-26 NOTE — Progress Notes (Signed)
[facility] PROGRESS NOTE  [name] UJW:[id number] DOB: [birth year] DOA: 05/23/2013 PCP: Bufford Spikes, DO Brief HPI: [name] is a 62 y.o. female with h/o COPD on 5L O2 at home at baseline, she presents to the ED with c/o three episodes of syncope earlier today. At least 2 out of the 3 episodes did occur after going from a seated to a standing position. One occurred after getting up from a chair to go to the fridge, another occurred after getting up out of bed. She notes that recently she has had to sit still for a few mins because "the room spins" when ever she goes from a lying to a seated position on her bed, this before going to a standing position. She is admitted to hospitalist service for further evaluation.   Assessment/Plan: Syncope: possibly from orthostatic hypotension,.  - admitted to telemetry.  - serial cardiac markers negative , echo pending and EKG . Initial EKG show NSR - PT eval.  - echocardiogram done and pending.    2. Lung cancer in remission:  - initial CT head negative for metastatic disease.  - MRI brain could not be done because of the stimulater.   3. COPD:  - NEBS AS needed.   4. DVT prophylaxis.   5. Back pain; pain meds and pt.   Code Status: DNR Family Communication: None at bedside Disposition Plan: remains inpatient   Consultants:  none  Procedures:  Echo pendng  Antibiotics:  none  HPI/Subjective: her back pain is improved. She is more alert and able to participate in PT.  Objective: Filed Vitals:   05/26/13 1403  BP: 121/66  Pulse: 88  Temp: 97.9 F (36.6 C)  Resp: 20    Intake/Output Summary (Last 24 hours) at 05/26/13 1755 Last data filed at 05/26/13 1700  Gross per 24 hour  Intake    483 ml  Output   1100 ml  Net   -617 ml   Filed Weights   05/23/13 2254 05/25/13 0458 05/26/13 0544  Weight: 56.7 kg (125 lb) 56.5 kg (124 lb 9 oz) 56.3 kg (124 lb 1.9 oz)    Exam:  Cardiovascular: RRR w/o MRG   Respiratory: CTA B  Abdomen: soft, nt, nd, bs+  Skin: neurofibromatosis  Musculoskeletal: MAE, full ROM all 4 extremities     Data Reviewed: Basic Metabolic Panel:  Recent Labs Lab 05/23/13 1320  NA 145  K 4.8  CL 109  CO2 28  GLUCOSE 90  BUN 9  CREATININE 0.71  CALCIUM 9.0   Liver Function Tests: No results found for this basename: AST, ALT, ALKPHOS, BILITOT, PROT, ALBUMIN,  in the last 168 hours No results found for this basename: LIPASE, AMYLASE,  in the last 168 hours No results found for this basename: AMMONIA,  in the last 168 hours CBC:  Recent Labs Lab 05/23/13 1320  WBC 5.8  HGB 11.7*  HCT 34.8*  MCV 96.1  PLT 215   Cardiac Enzymes: No results found for this basename: CKTOTAL, CKMB, CKMBINDEX, TROPONINI,  in the last 168 hours BNP (last 3 results)  Recent Labs  11/04/12 2309 05/23/13 1320  PROBNP 162.4* 635.7*   CBG: No results found for this basename: GLUCAP,  in the last 168 hours  No results found for this or any previous visit (from the past 240 hour(s)).   Studies: No results found.  Scheduled Meds: . antiseptic oral rinse  15 mL Mouth Rinse BID  . atorvastatin  10  mg Oral QHS  . heparin  5,000 Units Subcutaneous Q8H  . lisinopril  40 mg Oral QPM  . mirtazapine  15 mg Oral QHS  . QUEtiapine  400 mg Oral QHS  . sodium chloride  3 mL Intravenous Q12H   Continuous Infusions:   Principal Problem:   Syncope Active Problems:   CHRONIC OBSTRUCTIVE PULMONARY DISEASE    Time spent: 25 m in    [facility]  [facility] Pager [phone]. If 7PM-7AM, please contact night-coverage at [email or web address], password [facility] 05/26/2013, 5:55 PM  LOS: 3 days

## 2013-05-27 MED ORDER — IPRATROPIUM-ALBUTEROL 0.5-2.5 (3) MG/3ML IN SOLN: 3.0000 mL | RESPIRATORY_TRACT | Status: DC | PRN

## 2013-05-27 MED ORDER — PREDNISONE 20 MG PO TABS: ORAL_TABLET | ORAL | Status: DC

## 2013-05-27 MED ORDER — PREDNISONE 50 MG PO TABS
60.0000 mg | ORAL_TABLET | Freq: Once | ORAL | Status: AC
  Administered 2013-05-27: 60 mg via ORAL
  Filled 2013-05-27: qty 1

## 2013-05-27 MED ORDER — TRAMADOL HCL 50 MG PO TABS: 50.0000 mg | ORAL_TABLET | Freq: Four times a day (QID) | ORAL | Status: DC | PRN
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
## 2013-05-27 NOTE — Progress Notes (Signed)
Physical Therapy Treatment Patient Details Name: [name] MRN: [id number] DOB: 1950/12/15 Today's Date: 05/27/2013 Time: 1324-4010 PT Time Calculation (min): 24 min  PT Assessment / Plan / Recommendation  History of Present Illness [name] is a 62 y.o. female with h/o COPD on 5L O2 at home at baseline, she presents to the ED with c/o three episodes of syncope earlier today. At least 2 out of the 3 episodes did occur after going from a seated to a standing position. One occurred after getting up from a chair to go to the fridge, another occurred after getting up out of bed. She notes that recently she has had to sit still for a few mins because "the room spins" when ever she goes from a lying to a seated position on her bed, this before going to a standing position   PT Comments   Pt with slight progression with gait from evaluationi with sats maintained 92-98% on 5L throughout although pt reports SOB and required 3 standing rest stops during gait. Pt without dizziness or presyncope during any activities today. With visual tracking no nystagmus in any direction however pt consistently losing target in all directions and unaware, asked pt to perform head turns with gaze fixation but pt stated it was too difficult and could barely shake head at all. No noted nystagmus or reproduction of presyncope with any change in position. Pt unable to tolerate further vestibular screening or activity.   Follow Up Recommendations  Home health PT;Other (comment)     Does the patient have the potential to tolerate intense rehabilitation     Barriers to Discharge        Equipment Recommendations       Recommendations for Other Services    Frequency     Progress towards PT Goals Progress towards PT goals: Progressing toward goals  Plan Current plan remains appropriate    Precautions / Restrictions Precautions Precautions: Fall Precaution Comments: O2   Pertinent Vitals/Pain 5/10 chronic back  pain   Mobility  Bed Mobility Bed Mobility: Sit to Supine Supine to Sit: 6: Modified independent (Device/Increase time);HOB elevated;With rails Details for Bed Mobility Assistance: increased time to complete Transfers Transfers: Stand Pivot Transfers Sit to Stand: 5: Supervision;From bed Stand to Sit: 5: Supervision;To chair/3-in-1;With armrests;To bed Stand Pivot Transfers: 5: Supervision Details for Transfer Assistance: cueing for hand placement and safety Ambulation/Gait Ambulation/Gait Assistance: 5: Supervision Ambulation Distance (Feet): 120 Feet Assistive device: Rolling walker Ambulation/Gait Assistance Details: cueing for posture and position in RW with distance limited by fatigue Gait Pattern: Step-through pattern;Trunk flexed;Decreased stride length Stairs: No    Exercises     PT Diagnosis:    PT Problem List:   PT Treatment Interventions:     PT Goals (current goals can now be found in the care plan section)    Visit Information  Last PT Received On: 05/27/13 Assistance Needed: +1 History of Present Illness: [name] is a 62 y.o. female with h/o COPD on 5L O2 at home at baseline, she presents to the ED with c/o three episodes of syncope earlier today. At least 2 out of the 3 episodes did occur after going from a seated to a standing position. One occurred after getting up from a chair to go to the fridge, another occurred after getting up out of bed. She notes that recently she has had to sit still for a few mins because "the room spins" when ever she goes from a lying  to a seated position on her bed, this before going to a standing position    Subjective Data      Cognition  Cognition Arousal/Alertness: Awake/alert Behavior During Therapy: Anxious Overall Cognitive Status: Within Functional Limits for tasks assessed    Balance     End of Session PT - End of Session Activity Tolerance: Patient limited by fatigue Patient left: in bed;with call  bell/phone within reach Nurse Communication: Mobility status   GP     Toney Sang [facility] 05/27/2013, 10:09 AM Delaney Meigs, PT [phone]

## 2013-05-28 ENCOUNTER — Telehealth: Payer: Self-pay | Admitting: Internal Medicine

## 2013-05-28 NOTE — Telephone Encounter (Signed)
She is going to have to pace herself. We can make her a f/u here sometime in next 2 weeks with goal of reducing ER visits. She may not be eligible, but would be a good one for the [facility] case managers to follow if possible.

## 2013-05-28 NOTE — Telephone Encounter (Signed)
I called pt to offer her appt with CDY tomorrow and or Friday. She reports she does not think she needs to be seen. She is feeling much better now since resting. She reports she has to be brought here for an appt by someone and her husband can't. Also she has to get her grand baby off the bus at 3 PM. She is fine with a call back tomorrow for CDY recs. She does not feel like she needs this addressed ASAP bc her O2 sats are in the 90's now. I advised will forward to CDY for review tomorrow then.

## 2013-05-28 NOTE — ED Provider Notes (Signed)
Medical screening examination/treatment/procedure(s) were conducted as a shared visit with non-physician practitioner(s) and myself.  I personally evaluated the patient during the encounter.  COPD on home O2 with worsening SOB and cough. Recurrent syncopal episodes some with position change, others without warning. NO chest pain.   CN 2-12 intact, no ataxia on finger to nose, no nystagmus, 5/5 strength throughout, no pronator drift, Romberg negative, normal gait. Respiratory status appears stable.  Syncope workup, r/o PE with cancer history.  EKG Interpretation     Ventricular Rate:  95 PR Interval:  118 QRS Duration: 70 QT Interval:  382 QTC Calculation: 480 R Axis:   66 Text Interpretation:  Normal sinus rhythm Normal ECG ED PHYSICIAN INTERPRETATION AVAILABLE IN [place] Lamont Dowdy, MD 05/28/13 [phone]

## 2013-05-28 NOTE — Telephone Encounter (Signed)
lmtcb x1 for [name]. I called pt home # as well. I spoke with pt and she was d/c'd from hospital yesterday. She had fell, having SOB-was told she had lung infection. Pt reports she uses 5 liters O2 24/7 now. Today pt was walking from the bedroom to the living room and checked her O2 sats-85%. Pt reports she sat down and took deep breathes and recovered to 93% on 5 liters O2. She was not told if she needed to follow up with [place] or not and has no pending appt. Please advise Dr. Maple Hudson thanks  Allergies  Allergen Reactions  . Penicillins Rash

## 2013-05-28 NOTE — Telephone Encounter (Signed)
[email or web address]

## 2013-05-30 ENCOUNTER — Ambulatory Visit (INDEPENDENT_AMBULATORY_CARE_PROVIDER_SITE_OTHER): Payer: Medicare Other | Admitting: Internal Medicine

## 2013-05-30 ENCOUNTER — Encounter: Payer: Self-pay | Admitting: Internal Medicine

## 2013-05-30 VITALS — BP 120/64 | HR 69 | Temp 97.4°F | Resp 16 | Wt 116.8 lb

## 2013-05-30 DIAGNOSIS — J439 Emphysema, unspecified: Secondary | ICD-10-CM

## 2013-05-30 DIAGNOSIS — C3490 Malignant neoplasm of unspecified part of unspecified bronchus or lung: Secondary | ICD-10-CM

## 2013-05-30 DIAGNOSIS — J441 Chronic obstructive pulmonary disease with (acute) exacerbation: Secondary | ICD-10-CM

## 2013-05-30 DIAGNOSIS — R634 Abnormal weight loss: Secondary | ICD-10-CM

## 2013-05-30 DIAGNOSIS — F17200 Nicotine dependence, unspecified, uncomplicated: Secondary | ICD-10-CM

## 2013-05-30 DIAGNOSIS — J438 Other emphysema: Secondary | ICD-10-CM

## 2013-05-30 MED ORDER — DOXYCYCLINE HYCLATE 100 MG PO TABS: ORAL_TABLET | ORAL | Status: DC

## 2013-05-30 MED ORDER — PREDNISONE 10 MG PO TABS: ORAL_TABLET | ORAL | Status: DC
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
## 2013-06-02 ENCOUNTER — Telehealth: Payer: Self-pay | Admitting: Internal Medicine

## 2013-06-02 NOTE — Telephone Encounter (Signed)
appt set for 06-10-13. Carron Curie, CMA

## 2013-06-02 NOTE — Telephone Encounter (Signed)
I spoke with the pt and she cannot come on 11-4. 11-11. Or 11-14. Per previous phone note CY wants  Her to f/u in 2 weeks. Please advise if we can work the pt in on another day. Carron Curie, CMA

## 2013-06-02 NOTE — Telephone Encounter (Signed)
Please have patient come in on Wednesday 06-18-13 at 11:15am slot(arrive at 11:00am) or Thursday 06-19-13 at 11:00am slot(arrive at 10:45am). Thanks

## 2013-06-02 NOTE — Telephone Encounter (Signed)
Pt given appt with CY on 06-18-13 at 11:15

## 2013-06-05 ENCOUNTER — Telehealth: Payer: Self-pay

## 2013-06-05 MED ORDER — OXYCODONE-ACETAMINOPHEN 7.5-325 MG PO TABS: 2.0000 | ORAL_TABLET | Freq: Four times a day (QID) | ORAL | Status: DC | PRN

## 2013-06-05 NOTE — Telephone Encounter (Signed)
Message left on triage VM:  Need refill on percocet, patient with 3 pills left but will be leaving to go out of town  Last Filled 05-09-2013, Next fill due date 06/09/13 (Monday) Last OV 05/30/13  Dr.Reed please advise if ok to fill a few days early

## 2013-06-05 NOTE — Telephone Encounter (Signed)
Left message on voicemail informing patient rx is ready for pick-up and refilling early will not be routine.

## 2013-06-05 NOTE — Telephone Encounter (Signed)
This is ok, but should not be done routinely

## 2013-06-10 ENCOUNTER — Ambulatory Visit: Payer: Medicare Other | Admitting: Internal Medicine

## 2013-06-18 ENCOUNTER — Ambulatory Visit: Payer: Medicare Other | Admitting: Internal Medicine

## 2013-06-21 ENCOUNTER — Other Ambulatory Visit: Payer: Self-pay | Admitting: Internal Medicine

## 2013-07-02 ENCOUNTER — Telehealth: Payer: Self-pay

## 2013-07-02 MED ORDER — OXYCODONE-ACETAMINOPHEN 7.5-325 MG PO TABS: 2.0000 | ORAL_TABLET | Freq: Four times a day (QID) | ORAL | Status: DC | PRN

## 2013-07-02 NOTE — Telephone Encounter (Signed)
Patient called triage line requesting refill on pain medication. Patient will be leaving to go out of town this evening  RX last filled 06/05/2013, patient aware rx will have note : DO NOT FILL PRIOR TO 07/06/2013

## 2013-07-14 ENCOUNTER — Ambulatory Visit: Payer: Medicare Other | Admitting: Internal Medicine

## 2013-07-19 ENCOUNTER — Emergency Department (HOSPITAL_COMMUNITY): Payer: Medicare Other

## 2013-07-19 ENCOUNTER — Inpatient Hospital Stay (HOSPITAL_COMMUNITY)
Admission: EM | Admit: 2013-07-19 | Discharge: 2013-07-22 | DRG: 191 | Disposition: A | Discharge status: Home Health | Payer: Medicare Other | Attending: Internal Medicine | Admitting: Internal Medicine

## 2013-07-19 ENCOUNTER — Encounter (HOSPITAL_COMMUNITY): Payer: Self-pay | Admitting: Emergency Medicine

## 2013-07-19 DIAGNOSIS — Z923 Personal history of irradiation: Secondary | ICD-10-CM

## 2013-07-19 DIAGNOSIS — F1120 Opioid dependence, uncomplicated: Secondary | ICD-10-CM

## 2013-07-19 DIAGNOSIS — G4700 Insomnia, unspecified: Secondary | ICD-10-CM

## 2013-07-19 DIAGNOSIS — C3490 Malignant neoplasm of unspecified part of unspecified bronchus or lung: Secondary | ICD-10-CM | POA: Diagnosis present

## 2013-07-19 DIAGNOSIS — F3289 Other specified depressive episodes: Secondary | ICD-10-CM

## 2013-07-19 DIAGNOSIS — Z85118 Personal history of other malignant neoplasm of bronchus and lung: Secondary | ICD-10-CM

## 2013-07-19 DIAGNOSIS — M545 Low back pain, unspecified: Secondary | ICD-10-CM

## 2013-07-19 DIAGNOSIS — F17200 Nicotine dependence, unspecified, uncomplicated: Secondary | ICD-10-CM | POA: Diagnosis present

## 2013-07-19 DIAGNOSIS — M1990 Unspecified osteoarthritis, unspecified site: Secondary | ICD-10-CM | POA: Diagnosis present

## 2013-07-19 DIAGNOSIS — I739 Peripheral vascular disease, unspecified: Secondary | ICD-10-CM

## 2013-07-19 DIAGNOSIS — J4489 Other specified chronic obstructive pulmonary disease: Secondary | ICD-10-CM

## 2013-07-19 DIAGNOSIS — J9620 Acute and chronic respiratory failure, unspecified whether with hypoxia or hypercapnia: Secondary | ICD-10-CM

## 2013-07-19 DIAGNOSIS — F1910 Other psychoactive substance abuse, uncomplicated: Secondary | ICD-10-CM

## 2013-07-19 DIAGNOSIS — J441 Chronic obstructive pulmonary disease with (acute) exacerbation: Secondary | ICD-10-CM | POA: Diagnosis present

## 2013-07-19 DIAGNOSIS — I509 Heart failure, unspecified: Secondary | ICD-10-CM | POA: Diagnosis present

## 2013-07-19 DIAGNOSIS — Z862 Personal history of diseases of the blood and blood-forming organs and certain disorders involving the immune mechanism: Secondary | ICD-10-CM

## 2013-07-19 DIAGNOSIS — D126 Benign neoplasm of colon, unspecified: Secondary | ICD-10-CM

## 2013-07-19 DIAGNOSIS — F329 Major depressive disorder, single episode, unspecified: Secondary | ICD-10-CM | POA: Diagnosis present

## 2013-07-19 DIAGNOSIS — J9610 Chronic respiratory failure, unspecified whether with hypoxia or hypercapnia: Secondary | ICD-10-CM | POA: Diagnosis present

## 2013-07-19 DIAGNOSIS — Z79899 Other long term (current) drug therapy: Secondary | ICD-10-CM

## 2013-07-19 DIAGNOSIS — I10 Essential (primary) hypertension: Secondary | ICD-10-CM | POA: Diagnosis present

## 2013-07-19 DIAGNOSIS — Q8500 Neurofibromatosis, unspecified: Secondary | ICD-10-CM | POA: Diagnosis present

## 2013-07-19 DIAGNOSIS — R0902 Hypoxemia: Secondary | ICD-10-CM | POA: Diagnosis present

## 2013-07-19 DIAGNOSIS — R0602 Shortness of breath: Secondary | ICD-10-CM

## 2013-07-19 DIAGNOSIS — F411 Generalized anxiety disorder: Secondary | ICD-10-CM

## 2013-07-19 DIAGNOSIS — L02412 Cutaneous abscess of left axilla: Secondary | ICD-10-CM

## 2013-07-19 DIAGNOSIS — E785 Hyperlipidemia, unspecified: Secondary | ICD-10-CM | POA: Diagnosis present

## 2013-07-19 DIAGNOSIS — J209 Acute bronchitis, unspecified: Principal | ICD-10-CM | POA: Diagnosis present

## 2013-07-19 DIAGNOSIS — J449 Chronic obstructive pulmonary disease, unspecified: Secondary | ICD-10-CM

## 2013-07-19 DIAGNOSIS — Z9981 Dependence on supplemental oxygen: Secondary | ICD-10-CM

## 2013-07-19 DIAGNOSIS — M810 Age-related osteoporosis without current pathological fracture: Secondary | ICD-10-CM | POA: Diagnosis present

## 2013-07-19 DIAGNOSIS — R55 Syncope and collapse: Secondary | ICD-10-CM

## 2013-07-19 DIAGNOSIS — E875 Hyperkalemia: Secondary | ICD-10-CM

## 2013-07-19 DIAGNOSIS — I5032 Chronic diastolic (congestive) heart failure: Secondary | ICD-10-CM | POA: Diagnosis present

## 2013-07-19 DIAGNOSIS — R51 Headache: Secondary | ICD-10-CM

## 2013-07-19 DIAGNOSIS — J440 Chronic obstructive pulmonary disease with acute lower respiratory infection: Principal | ICD-10-CM | POA: Diagnosis present

## 2013-07-19 LAB — CBC
HCT: 35.8 % — ABNORMAL LOW (ref 36.0–46.0)
MCV: 97 fL (ref 78.0–100.0)
RBC: 3.69 MIL/uL — ABNORMAL LOW (ref 3.87–5.11)
RDW: 14 % (ref 11.5–15.5)
WBC: 5 10*3/uL (ref 4.0–10.5)

## 2013-07-19 LAB — BASIC METABOLIC PANEL
BUN: 18 mg/dL (ref 6–23)
Calcium: 8.2 mg/dL — ABNORMAL LOW (ref 8.4–10.5)
GFR calc Af Amer: >90 mL/min (ref >90)
GFR calc non Af Amer: 88 mL/min — ABNORMAL LOW (ref >90)
Glucose, Bld: 199 mg/dL — ABNORMAL HIGH (ref 70–99)
Potassium: 3 mEq/L — ABNORMAL LOW (ref 3.5–5.1)
Sodium: 147 mEq/L — ABNORMAL HIGH (ref 135–145)

## 2013-07-19 LAB — POCT I-STAT TROPONIN I

## 2013-07-19 MED ORDER — LEVOFLOXACIN IN D5W 500 MG/100ML IV SOLN
500.0000 mg | Freq: Once | INTRAVENOUS | Status: AC
  Administered 2013-07-20: 500 mg via INTRAVENOUS
  Filled 2013-07-19: qty 100

## 2013-07-19 MED ORDER — MORPHINE SULFATE 4 MG/ML IJ SOLN
4.0000 mg | Freq: Once | INTRAMUSCULAR | Status: AC
  Administered 2013-07-19: 4 mg via INTRAVENOUS
  Filled 2013-07-19: qty 1

## 2013-07-19 MED ORDER — SODIUM CHLORIDE 0.9 % IV BOLUS (SEPSIS)
500.0000 mL | Freq: Once | INTRAVENOUS | Status: AC
  Administered 2013-07-19: 500 mL via INTRAVENOUS

## 2013-07-19 NOTE — ED Notes (Signed)
EMS Neb treatment complete, Patient switched to 5L via [place] per normal home usage. O2 Saturations currently maintaining @ 99%

## 2013-07-19 NOTE — ED Notes (Signed)
Patient here with complaint of productive cough over the past several days with subjective fevers. Tonight around 1900 patient began feeling extreme SOB and EMS was called. BPs initially were low for EMS. Meds given in Field: NS Bolus, 4 Chewable Aspirin, 125 Solu-Medrol, 10 Albuterol, and 0.5 Atrovent. VS recovered PTA with SBP @ 100.

## 2013-07-19 NOTE — ED Notes (Signed)
Old and new EKG given to Dr Wentz. 

## 2013-07-19 NOTE — ED Provider Notes (Signed)
CSN: [id number]     Arrival date & time 07/19/13  2148 History   First MD Initiated Contact with Patient 07/19/13 2251     Chief Complaint  Patient presents with  . Shortness of Breath   (Consider location/radiation/quality/duration/timing/severity/associated sxs/prior Treatment) HPI Comments: 62 yo wf with h/o COPD, Neurofibromatosis, Substance abuse, Lung CA, CHF, HTN, Depression presents with acute SOB and productive cough.  Pt has been exerting more than usual because she has been getting ready for Christmas.    Pt is on home O2 at 5 L.  Pt reports CP associated with cough and at baseline.    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  It was diagnosed 2 years ago.  She is not on blood thinners.     Patient is a 62 y.o. female presenting with shortness of breath. The history is provided by the patient and the EMS personnel.  Shortness of Breath Severity:  Moderate Onset quality:  Gradual Duration:  3 days Timing:  Intermittent Progression:  Worsening Chronicity:  Recurrent Context: activity   Context: not animal exposure, not emotional upset, not fumes, not known allergens, not occupational exposure, not pollens, not smoke exposure, not strong odors, not URI and not weather changes   Relieved by:  Inhaler (nebulizer) Worsened by:  Activity and coughing (productive cough - greenish yellow sputum) Associated symptoms: chest pain and cough   Associated symptoms: no diaphoresis, no fever, no headaches, no hemoptysis, no neck pain, no PND, no rash, no sore throat, no sputum production, no syncope, no swollen glands and no vomiting   Risk factors: hx of cancer     Past Medical History  Diagnosis Date  . DJD (degenerative joint disease)   . COPD (chronic obstructive pulmonary disease)   . Tobacco dependence 2000    low back  . Avascular necrosis   . Osteoporosis   . Substance abuse   . Neurofibromatosis   . Hyperplastic colonic polyp   . CHF (congestive heart  failure)   . Abnormal weight loss   . Essential hypertension, benign   . Depressive disorder, not elsewhere classified   . Constipation   . Low back pain   . Anxiety   . Lung cancer 2012    LUL  . Shortness of breath   . Traumatic pneumothorax     history of  . History of radiation therapy 02/06/11 -02/17/11    LUL   Past Surgical History  Procedure Laterality Date  . Laminectomy  02/2003  . Back surgery      multiple  . Tubal ligation    . Vesicovaginal fistula closure w/ tah    . Cholecystectomy    . Tonsilectomy, adenoidectomy, bilateral myringotomy and tubes    . Hip surgery      right hip, metal plate   Family History  Problem Relation Age of Onset  . Lung cancer Father   . Emphysema Mother   . Neurofibromatosis Mother   . Liver disease     History  Substance Use Topics  . Smoking status: Current Every Day Smoker -- 0.50 packs/day for 35 years    Types: Cigarettes  . Smokeless tobacco: Never Used  . Alcohol Use: No   OB History   Grav Para Term Preterm Abortions TAB SAB Ect Mult Living                 Review of Systems  Constitutional: Positive for activity change. Negative  for fever and diaphoresis.  HENT: Negative.  Negative for sore throat.   Eyes: Negative.   Respiratory: Positive for cough, chest tightness and shortness of breath. Negative for hemoptysis, sputum production and choking.   Cardiovascular: Positive for chest pain. Negative for palpitations, leg swelling, syncope and PND.  Gastrointestinal: Negative.  Negative for nausea, vomiting, diarrhea, constipation and blood in stool.  Endocrine: Negative.   Genitourinary: Negative.   Musculoskeletal: Negative for neck pain.  Skin: Negative for rash.  Allergic/Immunologic: Negative.   Neurological: Negative for seizures, facial asymmetry, speech difficulty, light-headedness, numbness and headaches.  All other systems reviewed and are negative.    Allergies  Penicillins  Home Medications    Current Outpatient Rx  Name  Route  Sig  Dispense  Refill  . acetaminophen (TYLENOL) 500 MG tablet   Oral   Take 1,000 mg by mouth every 6 (six) hours as needed for pain.         Marland Kitchen ALPRAZolam (XANAX) 0.5 MG tablet   Oral   Take 0.5 mg by mouth every 6 (six) hours as needed for sleep or anxiety.         Marland Kitchen aspirin-acetaminophen-caffeine (EXCEDRIN MIGRAINE) 250-250-65 MG per tablet   Oral   Take 2 tablets by mouth every 6 (six) hours as needed for pain.         Marland Kitchen atorvastatin (LIPITOR) 10 MG tablet   Oral   Take 10 mg by mouth at bedtime.         Marland Kitchen ipratropium-albuterol (DUONEB) 0.5-2.5 (3) MG/3ML SOLN   Nebulization   Take 3 mLs by nebulization every 4 (four) hours as needed.   360 mL   1   . lisinopril (PRINIVIL,ZESTRIL) 40 MG tablet   Oral   Take 1 tablet (40 mg total) by mouth every evening.   30 tablet   5   . mirtazapine (REMERON) 15 MG tablet   Oral   Take 15 mg by mouth at bedtime. Take one tablet by mouth at bedtime for rest         . QUEtiapine (SEROQUEL) 200 MG tablet   Oral   Take 400 mg by mouth at bedtime.          BP 116/66  Temp(Src) 97.6 F (36.4 C) (Oral)  Resp 29  SpO2 100% Physical Exam  Nursing note and vitals reviewed. Constitutional: She is oriented to person, place, and time. Vital signs are normal. She appears cachectic. She has a sickly appearance. No distress.  PE c/w neurofibromatosis skin findings  HENT:  Head: Normocephalic.  Right Ear: External ear normal.  Left Ear: External ear normal.  Nose: Nose normal.  Mouth/Throat: Oropharynx is clear and moist. No oropharyngeal exudate.  Eyes: Conjunctivae and EOM are normal. Pupils are equal, round, and reactive to light.  Neck: Normal range of motion. Neck supple.  Pulmonary/Chest: No respiratory distress. She has wheezes. She has no rales. She exhibits tenderness.  Abdominal: Soft. Bowel sounds are normal. She exhibits no distension and no mass. There is no tenderness.  There is no rebound and no guarding.  Musculoskeletal: Normal range of motion. She exhibits no edema and no tenderness.  Neurological: She is alert and oriented to person, place, and time. She has normal reflexes.  Skin: She is not diaphoretic.    ED Course  Procedures (including critical care time) Labs Review Labs Reviewed  CBC - Abnormal; Notable for the following:    RBC 3.69 (*)    Hemoglobin 11.9 (*)  HCT 35.8 (*)    All other components within normal limits  BASIC METABOLIC PANEL - Abnormal; Notable for the following:    Sodium 147 (*)    Potassium 3.0 (*)    Glucose, Bld 199 (*)    Calcium 8.2 (*)    GFR calc non Af Amer 88 (*)    All other components within normal limits  PRO B NATRIURETIC PEPTIDE  POCT I-STAT TROPONIN I   Imaging Review Dg Chest 2 View (if Patient Has Fever And/or Copd)  07/19/2013   CLINICAL DATA:  Shortness of breath, chest pain  EXAM: CHEST  2 VIEW  COMPARISON:  Prior radiograph and CT from 05/23/2013  FINDINGS: Cardiac and mediastinal silhouettes are stable in size and contour, and remain within normal limit.  Lungs are mildly hypoinflated. Findings consistent with COPD are present. No focal infiltrate or pulmonary edema. No pleural effusion. Minimal linear opacity is seen in the region of previously seen irregular opacity within the left upper lobe, likely atelectasis or scarring. Irregular biapical pleural thickening noted. No pneumothorax.  Osseous structures are unchanged.  IMPRESSION: COPD.  No acute cardiopulmonary process.   Electronically Signed   By: Rise Mu M.D.   On: 07/19/2013 23:54    EKG Interpretation    Date/Time:  Saturday July 19 2013 22:13:06 EST Ventricular Rate:  121 PR Interval:  127 QRS Duration: 79 QT Interval:  360 QTC Calculation: 511 R Axis:   84 Text Interpretation:  Sinus tachycardia Ventricular premature complex Aberrant conduction of SV complex(es) Borderline right axis deviation Abnormal R-wave  progression, early transition Prolonged QT interval Since last tracing QT has lengthened and rate faster Confirmed by WENTZ  MD, ELLIOTT ([id number]) on 07/19/2013 10:37:06 PM           Results for orders placed during the hospital encounter of 07/19/13  CBC      Result Value Range   WBC 5.0  4.0 - 10.5 K/uL   RBC 3.69 (*) 3.87 - 5.11 MIL/uL   Hemoglobin 11.9 (*) 12.0 - 15.0 g/dL   HCT 29.5 (*) 28.4 - 13.2 %   MCV 97.0  78.0 - 100.0 fL   MCH 32.2  26.0 - 34.0 pg   MCHC 33.2  30.0 - 36.0 g/dL   RDW 44.0  10.2 - 72.5 %   Platelets 220  150 - 400 K/uL  PRO B NATRIURETIC PEPTIDE      Result Value Range   Pro B Natriuretic peptide (BNP) 94.7  0 - 125 pg/mL  BASIC METABOLIC PANEL      Result Value Range   Sodium 147 (*) 135 - 145 mEq/L   Potassium 3.0 (*) 3.5 - 5.1 mEq/L   Chloride 108  96 - 112 mEq/L   CO2 26  19 - 32 mEq/L   Glucose, Bld 199 (*) 70 - 99 mg/dL   BUN 18  6 - 23 mg/dL   Creatinine, Ser 3.66  0.50 - 1.10 mg/dL   Calcium 8.2 (*) 8.4 - 10.5 mg/dL   GFR calc non Af Amer 88 (*) >90 mL/min   GFR calc Af Amer >90  >90 mL/min  POCT I-STAT TROPONIN I      Result Value Range   Troponin i, poc 0.01  0.00 - 0.08 ng/mL   Comment 3              MDM   1. COPD (chronic obstructive pulmonary disease) with chronic bronchitis   2. SOB (shortness of  breath)    62 year old female with past medical history significant for COPD, neurofibromatosis, CHF, history of lung cancer, substance abuse, hypertension, depression presents with shortness of breath and productive cough over the past 3 days. Patient was brought to emergency department by EMS who provided full dose aspirin therapy and inhaler. Patient is on at home O2 at 5 L. Plan to treat as COPD exacerbation with RT therapy, antibiotics, and Solu-Medrol. Chest x-ray with chronic findings. Plan to consult medicine for admission for further inpatient management.  12:31 AM Consult hospitalist plan for admission. VSS.  PT in nad.     Darlys Gales, MD 07/20/13 [phone]

## 2013-07-20 ENCOUNTER — Encounter (HOSPITAL_COMMUNITY): Payer: Self-pay

## 2013-07-20 DIAGNOSIS — J449 Chronic obstructive pulmonary disease, unspecified: Secondary | ICD-10-CM

## 2013-07-20 DIAGNOSIS — I10 Essential (primary) hypertension: Secondary | ICD-10-CM

## 2013-07-20 DIAGNOSIS — E785 Hyperlipidemia, unspecified: Secondary | ICD-10-CM

## 2013-07-20 DIAGNOSIS — F411 Generalized anxiety disorder: Secondary | ICD-10-CM

## 2013-07-20 DIAGNOSIS — J209 Acute bronchitis, unspecified: Secondary | ICD-10-CM | POA: Diagnosis present

## 2013-07-20 DIAGNOSIS — M545 Low back pain: Secondary | ICD-10-CM

## 2013-07-20 DIAGNOSIS — R0902 Hypoxemia: Secondary | ICD-10-CM

## 2013-07-20 DIAGNOSIS — I509 Heart failure, unspecified: Secondary | ICD-10-CM

## 2013-07-20 DIAGNOSIS — Q8500 Neurofibromatosis, unspecified: Secondary | ICD-10-CM

## 2013-07-20 DIAGNOSIS — J441 Chronic obstructive pulmonary disease with (acute) exacerbation: Secondary | ICD-10-CM | POA: Diagnosis present

## 2013-07-20 DIAGNOSIS — F17200 Nicotine dependence, unspecified, uncomplicated: Secondary | ICD-10-CM

## 2013-07-20 LAB — BASIC METABOLIC PANEL
CO2: 22 mEq/L (ref 19–32)
Calcium: 8.1 mg/dL — ABNORMAL LOW (ref 8.4–10.5)
Chloride: 107 mEq/L (ref 96–112)
GFR calc Af Amer: >90 mL/min (ref >90)
GFR calc non Af Amer: >90 mL/min (ref >90)
Glucose, Bld: 173 mg/dL — ABNORMAL HIGH (ref 70–99)
Potassium: 3.5 mEq/L (ref 3.5–5.1)
Sodium: 142 mEq/L (ref 135–145)

## 2013-07-20 LAB — CBC
HCT: 32.9 % — ABNORMAL LOW (ref 36.0–46.0)
Hemoglobin: 10.8 g/dL — ABNORMAL LOW (ref 12.0–15.0)
MCHC: 32.8 g/dL (ref 30.0–36.0)
Platelets: 226 10*3/uL (ref 150–400)
RBC: 3.4 MIL/uL — ABNORMAL LOW (ref 3.87–5.11)
WBC: 6.6 10*3/uL (ref 4.0–10.5)

## 2013-07-20 MED ORDER — LEVOFLOXACIN IN D5W 500 MG/100ML IV SOLN
500.0000 mg | Freq: Every day | INTRAVENOUS | Status: DC
  Administered 2013-07-20 – 2013-07-21 (×2): 500 mg via INTRAVENOUS
  Filled 2013-07-20 (×4): qty 100

## 2013-07-20 MED ORDER — ACETAMINOPHEN 325 MG PO TABS
650.0000 mg | ORAL_TABLET | Freq: Four times a day (QID) | ORAL | Status: DC | PRN
Start: 2013-07-20 — End: 2013-07-22
  Administered 2013-07-20: 650 mg via ORAL
  Filled 2013-07-20: qty 2

## 2013-07-20 MED ORDER — ZOLPIDEM TARTRATE 5 MG PO TABS: 5.0000 mg | ORAL_TABLET | Freq: Every evening | ORAL | Status: DC | PRN

## 2013-07-20 MED ORDER — OXYCODONE HCL 5 MG PO TABS
5.0000 mg | ORAL_TABLET | ORAL | Status: DC | PRN
  Administered 2013-07-20: 5 mg via ORAL
  Filled 2013-07-20: qty 1

## 2013-07-20 MED ORDER — ALUM & MAG HYDROXIDE-SIMETH 200-200-20 MG/5ML PO SUSP
30.0000 mL | Freq: Four times a day (QID) | ORAL | Status: DC | PRN
  Administered 2013-07-21: 30 mL via ORAL
  Filled 2013-07-20: qty 30

## 2013-07-20 MED ORDER — ALPRAZOLAM 0.25 MG PO TABS
0.5000 mg | ORAL_TABLET | Freq: Four times a day (QID) | ORAL | Status: DC | PRN
  Administered 2013-07-20 – 2013-07-22 (×9): 0.5 mg via ORAL
  Filled 2013-07-20 (×9): qty 2

## 2013-07-20 MED ORDER — MIRTAZAPINE 15 MG PO TABS
15.0000 mg | ORAL_TABLET | Freq: Every day | ORAL | Status: DC
  Filled 2013-07-20: qty 1

## 2013-07-20 MED ORDER — SODIUM CHLORIDE 0.9 % IV SOLN
INTRAVENOUS | Status: DC
  Administered 2013-07-20 – 2013-07-21 (×2): via INTRAVENOUS

## 2013-07-20 MED ORDER — IPRATROPIUM BROMIDE 0.02 % IN SOLN
0.5000 mg | RESPIRATORY_TRACT | Status: DC
  Administered 2013-07-20 (×4): 0.5 mg via RESPIRATORY_TRACT
  Filled 2013-07-20 (×4): qty 2.5

## 2013-07-20 MED ORDER — OXYCODONE-ACETAMINOPHEN 5-325 MG PO TABS
2.0000 | ORAL_TABLET | Freq: Four times a day (QID) | ORAL | Status: DC | PRN
  Administered 2013-07-20 – 2013-07-22 (×6): 2 via ORAL
  Filled 2013-07-20 (×6): qty 2

## 2013-07-20 MED ORDER — ALBUTEROL SULFATE (5 MG/ML) 0.5% IN NEBU
2.5000 mg | INHALATION_SOLUTION | Freq: Four times a day (QID) | RESPIRATORY_TRACT | Status: DC
  Administered 2013-07-21 – 2013-07-22 (×6): 2.5 mg via RESPIRATORY_TRACT
  Filled 2013-07-20 (×5): qty 0.5

## 2013-07-20 MED ORDER — HYDROMORPHONE HCL PF 1 MG/ML IJ SOLN: 0.5000 mg | INTRAMUSCULAR | Status: DC | PRN

## 2013-07-20 MED ORDER — ENOXAPARIN SODIUM 40 MG/0.4ML ~~LOC~~ SOLN
40.0000 mg | Freq: Every day | SUBCUTANEOUS | Status: DC
  Administered 2013-07-20 – 2013-07-22 (×3): 40 mg via SUBCUTANEOUS
  Filled 2013-07-20 (×4): qty 0.4

## 2013-07-20 MED ORDER — NICOTINE 21 MG/24HR TD PT24
21.0000 mg | MEDICATED_PATCH | Freq: Every day | TRANSDERMAL | Status: DC
  Filled 2013-07-20 (×2): qty 1

## 2013-07-20 MED ORDER — ALBUTEROL SULFATE (5 MG/ML) 0.5% IN NEBU
2.5000 mg | INHALATION_SOLUTION | RESPIRATORY_TRACT | Status: DC
  Administered 2013-07-20 (×4): 2.5 mg via RESPIRATORY_TRACT
  Filled 2013-07-20 (×5): qty 0.5

## 2013-07-20 MED ORDER — ALBUTEROL SULFATE (5 MG/ML) 0.5% IN NEBU
2.5000 mg | INHALATION_SOLUTION | RESPIRATORY_TRACT | Status: DC | PRN
  Administered 2013-07-22: 2.5 mg via RESPIRATORY_TRACT
  Filled 2013-07-20 (×2): qty 0.5

## 2013-07-20 MED ORDER — LISINOPRIL 40 MG PO TABS
40.0000 mg | ORAL_TABLET | Freq: Every evening | ORAL | Status: DC
  Administered 2013-07-20 – 2013-07-21 (×2): 40 mg via ORAL
  Filled 2013-07-20 (×3): qty 1

## 2013-07-20 MED ORDER — METHYLPREDNISOLONE SODIUM SUCC 125 MG IJ SOLR
80.0000 mg | Freq: Two times a day (BID) | INTRAMUSCULAR | Status: DC
  Administered 2013-07-20: 80 mg via INTRAVENOUS
  Filled 2013-07-20 (×2): qty 1.28

## 2013-07-20 MED ORDER — ACETAMINOPHEN 650 MG RE SUPP: 650.0000 mg | Freq: Four times a day (QID) | RECTAL | Status: DC | PRN

## 2013-07-20 MED ORDER — ONDANSETRON HCL 4 MG PO TABS: 4.0000 mg | ORAL_TABLET | Freq: Four times a day (QID) | ORAL | Status: DC | PRN

## 2013-07-20 MED ORDER — ATORVASTATIN CALCIUM 10 MG PO TABS
10.0000 mg | ORAL_TABLET | Freq: Every day | ORAL | Status: DC
  Administered 2013-07-20 – 2013-07-21 (×2): 10 mg via ORAL
  Filled 2013-07-20 (×5): qty 1

## 2013-07-20 MED ORDER — ONDANSETRON HCL 4 MG/2ML IJ SOLN: 4.0000 mg | Freq: Four times a day (QID) | INTRAMUSCULAR | Status: DC | PRN

## 2013-07-20 MED ORDER — MIRTAZAPINE 15 MG PO TABS
15.0000 mg | ORAL_TABLET | Freq: Every day | ORAL | Status: DC
  Administered 2013-07-20 – 2013-07-21 (×3): 15 mg via ORAL
  Filled 2013-07-20 (×5): qty 1

## 2013-07-20 MED ORDER — IPRATROPIUM BROMIDE 0.02 % IN SOLN
0.5000 mg | Freq: Four times a day (QID) | RESPIRATORY_TRACT | Status: DC
  Administered 2013-07-21 – 2013-07-22 (×6): 0.5 mg via RESPIRATORY_TRACT
  Filled 2013-07-20 (×7): qty 2.5

## 2013-07-20 MED ORDER — QUETIAPINE FUMARATE 400 MG PO TABS
400.0000 mg | ORAL_TABLET | Freq: Every day | ORAL | Status: DC
  Administered 2013-07-20 – 2013-07-21 (×3): 400 mg via ORAL
  Filled 2013-07-20 (×5): qty 1

## 2013-07-20 MED ORDER — PREDNISONE 50 MG PO TABS
50.0000 mg | ORAL_TABLET | Freq: Every day | ORAL | Status: DC
  Administered 2013-07-21 – 2013-07-22 (×2): 50 mg via ORAL
  Filled 2013-07-20 (×5): qty 1

## 2013-07-20 NOTE — Progress Notes (Signed)
Respiratory assessment complete.  Patient with sob, productive cough, and BBS diminished, equal, with some crackles in bases.  CXR indicates COPD, no edema, no consolidation.  Patient received neb in EMS, with no relief of sob per patient.  Patient is currently on  4L [place], which she wears at home, sats 99%.  Albuterol and arovent nebs QID and Q2 prn ordered.  Patient does not want a neb at this time.  Patient asking for pain meds and reports some chest pain.  RN notified.

## 2013-07-20 NOTE — H&P (Signed)
[facility] History and Physical  [name] ZOX:[id number] DOB: 08-07-1951 DOA: 07/19/2013  Referring physician:  EDP PCP: Bufford Spikes, DO  Specialists:   Chief Complaint:  Sob, Fevers and Cough  HPI: [name] is a 62 y.o. female with a history of COPD who presents to the ED with complaints of worsening SOB, fevers and Cough productive of greenish sputum x 3 days.   She is on 5 liters [place] O2 at home,  In the ED a Chest X-ray was performed and found to be negative for pneumonia or acute CHF.  She was placed on IV Levaquin for acute bronchitis and administered IV solumedrol and Duonebs and referred for admission.      Review of Systems: The patient denies anorexia, headaches, weight loss, vision loss, diplopia, dizziness, decreased hearing, rhinitis, hoarseness, chest pain, syncope, dyspnea on exertion, peripheral edema, balance deficits, cough, hemoptysis, abdominal pain, nausea, vomiting, diarrhea, constipation, hematemesis, melena, hematochezia, severe indigestion/heartburn, dysuria, hematuria, incontinence, muscle weakness,  transient blindness, difficulty walking, depression, unusual weight change, abnormal bleeding, enlarged lymph nodes, angioedema, and breast masses.    Past Medical History  Diagnosis Date  . DJD (degenerative joint disease)   . COPD (chronic obstructive pulmonary disease)   . Tobacco dependence 2000    low back  . Avascular necrosis   . Osteoporosis   . Substance abuse   . Neurofibromatosis   . Hyperplastic colonic polyp   . CHF (congestive heart failure)   . Abnormal weight loss   . Essential hypertension, benign   . Depressive disorder, not elsewhere classified   . Constipation   . Low back pain   . Anxiety   . Lung cancer 2012    LUL  . Shortness of breath   . Traumatic pneumothorax     history of  . History of radiation therapy 02/06/11 -02/17/11    LUL    Past Surgical History  Procedure Laterality Date  . Laminectomy  02/2003   . Back surgery      multiple  . Tubal ligation    . Vesicovaginal fistula closure w/ tah    . Cholecystectomy    . Tonsilectomy, adenoidectomy, bilateral myringotomy and tubes    . Hip surgery      right hip, metal plate    Prior to Admission medications   Medication Sig Start Date End Date Taking? Authorizing Provider  acetaminophen (TYLENOL) 500 MG tablet Take 1,000 mg by mouth every 6 (six) hours as needed for pain.   Yes Historical Provider, MD  ALPRAZolam Prudy Feeler) 0.5 MG tablet Take 0.5 mg by mouth every 6 (six) hours as needed for sleep or anxiety.   Yes Historical Provider, MD  aspirin-acetaminophen-caffeine (EXCEDRIN MIGRAINE) [phone] MG per tablet Take 2 tablets by mouth every 6 (six) hours as needed for pain.   Yes Historical Provider, MD  atorvastatin (LIPITOR) 10 MG tablet Take 10 mg by mouth at bedtime.   Yes Historical Provider, MD  ipratropium-albuterol (DUONEB) 0.5-2.5 (3) MG/3ML SOLN Take 3 mLs by nebulization every 4 (four) hours as needed. 05/27/13  Yes Kathlen Mody, MD  lisinopril (PRINIVIL,ZESTRIL) 40 MG tablet Take 1 tablet (40 mg total) by mouth every evening. 02/12/13  Yes Kimber Relic, MD  mirtazapine (REMERON) 15 MG tablet Take 15 mg by mouth at bedtime. Take one tablet by mouth at bedtime for rest 12/16/12  Yes Tiffany L Reed, DO  QUEtiapine (SEROQUEL) 200 MG tablet Take 400 mg by mouth at bedtime.  Yes Historical Provider, MD    Allergies  Allergen Reactions  . Penicillins Rash    Social History:  reports that she has been smoking Cigarettes.  She has a 17.5 pack-year smoking history. She has never used smokeless tobacco. She reports that she does not drink alcohol or use illicit drugs.     Family History  Problem Relation Age of Onset  . Lung cancer Father   . Emphysema Mother   . Neurofibromatosis Mother   . Liver disease       Physical Exam:  GEN:  Pleasant Elderly appearing  62 y.o.Caucasian female  examined  and in no acute distress;  cooperative with exam Filed Vitals:   07/19/13 2153 07/19/13 2221 07/19/13 2239  BP:  116/66   Temp:  97.6 F (36.4 C)   TempSrc:  Oral   Resp:  29   SpO2: 100% 100% 100%   Blood pressure 116/66, temperature 97.6 F (36.4 C), temperature source Oral, resp. rate 29, SpO2 100.00%. PSYCH: She is alert and oriented x4; does not appear anxious does not appear depressed; affect is normal HEENT: Normocephalic and Atraumatic, Mucous membranes pink; PERRLA; EOM intact; Fundi:  Benign;  No scleral icterus, Nares: Patent, Oropharynx: Clear, Edentulous, Neck:  FROM, no cervical lymphadenopathy nor thyromegaly or carotid bruit; no JVD; Breasts:: Not examined CHEST WALL: No tenderness CHEST: Normal respiration, Diffuse Expiratory Wheezes HEART: Regular rate and rhythm; no murmurs rubs or gallops BACK: No kyphosis or scoliosis; no CVA tenderness ABDOMEN: Positive Bowel Sounds, soft non-tender; no masses, no organomegaly. Rectal Exam: Not done EXTREMITIES: No cyanosis, clubbing or edema; no ulcerations. Genitalia: not examined PULSES: 2+ and symmetric SKIN: Normal hydration no rash or ulceration CNS: Cranial nerves 2-12 grossly intact no focal neurologic deficit    Labs on Admission:  Basic Metabolic Panel:  Recent Labs Lab 07/19/13 2239  NA 147*  K 3.0*  CL 108  CO2 26  GLUCOSE 199*  BUN 18  CREATININE 0.75  CALCIUM 8.2*   Liver Function Tests: No results found for this basename: AST, ALT, ALKPHOS, BILITOT, PROT, ALBUMIN,  in the last 168 hours No results found for this basename: LIPASE, AMYLASE,  in the last 168 hours No results found for this basename: AMMONIA,  in the last 168 hours CBC:  Recent Labs Lab 07/19/13 2239  WBC 5.0  HGB 11.9*  HCT 35.8*  MCV 97.0  PLT 220   Cardiac Enzymes: No results found for this basename: CKTOTAL, CKMB, CKMBINDEX, TROPONINI,  in the last 168 hours  BNP (last 3 results)  Recent Labs  11/04/12 2309 05/23/13 1320 07/19/13 2239   PROBNP 162.4* 635.7* 94.7   CBG: No results found for this basename: GLUCAP,  in the last 168 hours  Radiological Exams on Admission: Dg Chest 2 View (if Patient Has Fever And/or Copd)  07/19/2013   CLINICAL DATA:  Shortness of breath, chest pain  EXAM: CHEST  2 VIEW  COMPARISON:  Prior radiograph and CT from 05/23/2013  FINDINGS: Cardiac and mediastinal silhouettes are stable in size and contour, and remain within normal limit.  Lungs are mildly hypoinflated. Findings consistent with COPD are present. No focal infiltrate or pulmonary edema. No pleural effusion. Minimal linear opacity is seen in the region of previously seen irregular opacity within the left upper lobe, likely atelectasis or scarring. Irregular biapical pleural thickening noted. No pneumothorax.  Osseous structures are unchanged.  IMPRESSION: COPD.  No acute cardiopulmonary process.   Electronically Signed   By: Sharlet Salina  Phill Myron M.D.   On: 07/19/2013 23:54        Assessment/Plan Principal Problem:   COPD exacerbation Active Problems:   Hypoxemia   Acute bronchitis   TOBACCO DEPENDENCE   HYPERTENSION, BENIGN SYSTEMIC   Adenocarcinoma LUL, hx of   CHF (congestive heart failure)   Hyperlipidemia LDL goal < 100   NEUROFIBROMATOSIS      1.   COPD Exacerbation-   Placed on DUONebs and IV Steroid Taper, and [place] O2, Monitor O2 Sats.     2.   Acute Bronchitis-  IV Levaquin   3.   Tobacco Dependence- Nicotine Patch daily while inpatient.   Encourages to continue to cut down and quit smoking.    4.   HTN-  Continue Lisinopril Rx.  Monitor BPs.    5.   CHF- Stable.    6.   Hyperlipidemia- continue Atorvastatin Rx.    7.   Adenocarcinoma LUL, Hx of -  Had XRT Rx, Remote Hx.    8.  DVT prophylaxis with Lovenox.         Code Status:    FULL CODE Family Communication:    No Family Present Disposition Plan:     Inpatient  Time spent:  76 Minutes  Ron Parker [facility] Pager  [phone]  If 7PM-7AM, please contact night-coverage [email or web address] Password [email or web address] 07/20/2013, 1:21 AM

## 2013-07-20 NOTE — Progress Notes (Signed)
[facility] PROGRESS NOTE  [name] AOZ:[id number] DOB: 01/27/1951 DOA: 07/19/2013 PCP: Bufford Spikes, DO  Assessment/Plan: 1. COPD exacerbation: sats are in the 90's on nasal canula.  She is on 5L home 02. She is conversing comfortably with no respiratory distress. Will switch to oral steroids. Continue with nebs, continue levaquin.  2. Tobacco dependence: cessation advised. Nicotine patch in place. 3. HTN: blood pressures controled. No changes 4. CHF: BNP normal. CXR clear. No evidence of decompensation 5. Back pain:  States that she takes percocet 7.5 mg q 6 hrs at home. Will restart home dose. 6. Anxiety: continue prn alprazolam, continue Seroquel and mirtazapine.  7. HLD: continue statin 8. History of adenocarcinoma of the lung: smoking cessation advised. 9.   Code Status: FULL Family Communication: spoke with patient Disposition Plan:  Remain inpatient. Switch to oral meds. Anticipate dc in am.   Consultants:  none  Procedures:  none  Antibiotics:  levaquin 12/13>>  HPI/Subjective: [name] is a 62 y.o. female with a history of COPD who presents to the ED with complaints of worsening SOB, fevers and Cough productive of greenish sputum x 3 days. She is on 5 liters [place] O2 at home, In the ED a Chest X-ray was performed and found to be negative for pneumonia or acute CHF. She was placed on IV Levaquin for acute bronchitis and administered IV solumedrol and Duonebs and referred for admission  Breathing is not labored this morning. She states that she feels exacerbation was brought on by stress.  No new complaints.  Objective: Filed Vitals:   07/20/13 0617  BP: 92/51  Pulse: 106  Temp:   Resp:     Intake/Output Summary (Last 24 hours) at 07/20/13 0953 Last data filed at 07/20/13 0600  Gross per 24 hour  Intake 246.25 ml  Output      0 ml  Net 246.25 ml   Filed Weights   07/20/13 0235  Weight: 55.43 kg (122 lb 3.2 oz)    Exam:   General:   Alert, uncomfortable due to back pain, no distress  Cardiovascular: rrr no mrg  Respiratory: coarse breath sounds, scattered wheezes, good air movement  Abdomen: bs+, soft, non tender  Musculoskeletal: no edema  Data Reviewed: Basic Metabolic Panel:  Recent Labs Lab 07/19/13 2239 07/20/13 0435  NA 147* 142  K 3.0* 3.5  CL 108 107  CO2 26 22  GLUCOSE 199* 173*  BUN 18 14  CREATININE 0.75 0.58  CALCIUM 8.2* 8.1*   Liver Function Tests: No results found for this basename: AST, ALT, ALKPHOS, BILITOT, PROT, ALBUMIN,  in the last 168 hours No results found for this basename: LIPASE, AMYLASE,  in the last 168 hours No results found for this basename: AMMONIA,  in the last 168 hours CBC:  Recent Labs Lab 07/19/13 2239 07/20/13 0435  WBC 5.0 6.6  HGB 11.9* 10.8*  HCT 35.8* 32.9*  MCV 97.0 96.8  PLT 220 226   Cardiac Enzymes: No results found for this basename: CKTOTAL, CKMB, CKMBINDEX, TROPONINI,  in the last 168 hours BNP (last 3 results)  Recent Labs  11/04/12 2309 05/23/13 1320 07/19/13 2239  PROBNP 162.4* 635.7* 94.7   CBG: No results found for this basename: GLUCAP,  in the last 168 hours  No results found for this or any previous visit (from the past 240 hour(s)).   Studies: Dg Chest 2 View (if Patient Has Fever And/or Copd)  07/19/2013   CLINICAL DATA:  Shortness  of breath, chest pain  EXAM: CHEST  2 VIEW  COMPARISON:  Prior radiograph and CT from 05/23/2013  FINDINGS: Cardiac and mediastinal silhouettes are stable in size and contour, and remain within normal limit.  Lungs are mildly hypoinflated. Findings consistent with COPD are present. No focal infiltrate or pulmonary edema. No pleural effusion. Minimal linear opacity is seen in the region of previously seen irregular opacity within the left upper lobe, likely atelectasis or scarring. Irregular biapical pleural thickening noted. No pneumothorax.  Osseous structures are unchanged.  IMPRESSION: COPD.  No  acute cardiopulmonary process.   Electronically Signed   By: Rise Mu M.D.   On: 07/19/2013 23:54    Scheduled Meds: . [START ON 07/21/2013] albuterol  2.5 mg Nebulization QID  . ipratropium  0.5 mg Nebulization Q4H   And  . albuterol  2.5 mg Nebulization Q4H  . atorvastatin  10 mg Oral QHS  . enoxaparin (LOVENOX) injection  40 mg Subcutaneous Daily  . ipratropium  0.5 mg Nebulization QID  . levofloxacin (LEVAQUIN) IV  500 mg Intravenous QHS  . lisinopril  40 mg Oral QPM  . methylPREDNISolone (SOLU-MEDROL) injection  80 mg Intravenous Q12H  . mirtazapine  15 mg Oral QHS  . nicotine  21 mg Transdermal Daily  . QUEtiapine  400 mg Oral QHS   Continuous Infusions: . sodium chloride 75 mL/hr at 07/20/13 0243    Principal Problem:   COPD exacerbation Active Problems:   NEUROFIBROMATOSIS   TOBACCO DEPENDENCE   HYPERTENSION, BENIGN SYSTEMIC   Adenocarcinoma LUL, hx of   Hypoxemia   CHF (congestive heart failure)   Hyperlipidemia LDL goal < 100   Acute bronchitis    Time spent: 35 minutes   Analicia Skibinski  [facility] Pager [phone]  If 7PM-7AM, please contact night-coverage at [email or web address], password [facility] 07/20/2013, 9:53 AM  LOS: 1 day

## 2013-07-20 NOTE — ED Notes (Signed)
ED Tech asked to

## 2013-07-21 DIAGNOSIS — I10 Essential (primary) hypertension: Secondary | ICD-10-CM

## 2013-07-21 MED ORDER — NICOTINE 21 MG/24HR TD PT24
21.0000 mg | MEDICATED_PATCH | Freq: Every day | TRANSDERMAL | Status: DC
  Administered 2013-07-21: 21 mg via TRANSDERMAL
  Filled 2013-07-21 (×2): qty 1

## 2013-07-21 NOTE — Care Management Note (Signed)
    Page 1 of 1   07/22/2013     3:24:03 PM   CARE MANAGEMENT NOTE 07/22/2013  Patient:  RIA, REDCAY   Account Number:  [id number]  Date Initiated:  07/21/2013  Documentation initiated by:  Akshita Italiano  Subjective/Objective Assessment:   PT ADM ON 12/13 WITH COPD EXACERBATION.  PTA, PT LIVES AT HOME WITH SPOUSE AND IS ON CHRONIC HOME O2.     Action/Plan:   WILL FOLLOW FOR DISCHARGE NEEDS AS PT PROGRESSES.   Anticipated DC Date:  07/22/2013   Anticipated DC Plan:  HOME W HOME HEALTH SERVICES      DC Planning Services  CM consult      Choice offered to / List presented to:             Status of service:  Completed, signed off Medicare Important Message given?   (If response is "NO", the following Medicare IM given date fields will be blank) Date Medicare IM given:   Date Additional Medicare IM given:    Discharge Disposition:  HOME/SELF CARE  Per UR Regulation:  Reviewed for med. necessity/level of care/duration of stay  If discussed at Long Length of Stay Meetings, dates discussed:    Comments:  07/22/13 Rosalita Chessman [phone] PT FOR DC HOME TODAY.  NO HOME NEEDS, PER PT/MD.

## 2013-07-21 NOTE — Progress Notes (Signed)
[facility] PROGRESS NOTE  [name] ZCH:[id number] DOB: July 16, 1951 DOA: 07/19/2013 PCP: Bufford Spikes, DO  Assessment/Plan: 62 y/o with chronic respiratory failure on home O2, tobacco use, COPD, HTN, CHF presented with SOB and admitetd with COPD exacerbation   1. COPD exacerbation, chronic respiratory failure on home O2;  -improving, but still symptomatic, cont steroids, bronchodilators, oxygen, atx;    2. HTN stable; cont home regimen  3. Chronic CHF ? diastolic HF; echo (2013): LVEF 60%, LVH; clinically euvolemic; cont home regime   4. Tobacco dependence: cessation advised. Nicotine patch in place.  5. Back pain: States that she takes percocet 7.5 mg q 6 hrs at home. Will restart home dose  6. History of adenocarcinoma of the lung: smoking cessation advised.   Code Status: full Family Communication:  Dw/ patient  (indicate person spoken with, relationship, and if by phone, the number) Disposition Plan: home in 24-*48 hours    Consultants:  None   Procedures:  None   Antibiotics: levaquin 12/13>>   (indicate start date, and stop date if known)  HPI/Subjective: alert  Objective: Filed Vitals:   07/21/13 0454  BP: 109/60  Pulse: 94  Temp: 98.1 F (36.7 C)  Resp: 20    Intake/Output Summary (Last 24 hours) at 07/21/13 0855 Last data filed at 07/21/13 0600  Gross per 24 hour  Intake   2040 ml  Output   1275 ml  Net    765 ml   Filed Weights   07/20/13 0235  Weight: 55.43 kg (122 lb 3.2 oz)    Exam:   General:  alert  Cardiovascular: s1,s2 rrr  Respiratory: poor ventilation improving   Abdomen: soft, nt, nd   Musculoskeletal: no LE edema    Data Reviewed: Basic Metabolic Panel:  Recent Labs Lab 07/19/13 2239 07/20/13 0435  NA 147* 142  K 3.0* 3.5  CL 108 107  CO2 26 22  GLUCOSE 199* 173*  BUN 18 14  CREATININE 0.75 0.58  CALCIUM 8.2* 8.1*   Liver Function Tests: No results found for this basename: AST, ALT,  ALKPHOS, BILITOT, PROT, ALBUMIN,  in the last 168 hours No results found for this basename: LIPASE, AMYLASE,  in the last 168 hours No results found for this basename: AMMONIA,  in the last 168 hours CBC:  Recent Labs Lab 07/19/13 2239 07/20/13 0435  WBC 5.0 6.6  HGB 11.9* 10.8*  HCT 35.8* 32.9*  MCV 97.0 96.8  PLT 220 226   Cardiac Enzymes: No results found for this basename: CKTOTAL, CKMB, CKMBINDEX, TROPONINI,  in the last 168 hours BNP (last 3 results)  Recent Labs  11/04/12 2309 05/23/13 1320 07/19/13 2239  PROBNP 162.4* 635.7* 94.7   CBG: No results found for this basename: GLUCAP,  in the last 168 hours  No results found for this or any previous visit (from the past 240 hour(s)).   Studies: Dg Chest 2 View (if Patient Has Fever And/or Copd)  07/19/2013   CLINICAL DATA:  Shortness of breath, chest pain  EXAM: CHEST  2 VIEW  COMPARISON:  Prior radiograph and CT from 05/23/2013  FINDINGS: Cardiac and mediastinal silhouettes are stable in size and contour, and remain within normal limit.  Lungs are mildly hypoinflated. Findings consistent with COPD are present. No focal infiltrate or pulmonary edema. No pleural effusion. Minimal linear opacity is seen in the region of previously seen irregular opacity within the left upper lobe, likely atelectasis or scarring. Irregular biapical pleural thickening noted. No  pneumothorax.  Osseous structures are unchanged.  IMPRESSION: COPD.  No acute cardiopulmonary process.   Electronically Signed   By: Rise Mu M.D.   On: 07/19/2013 23:54    Scheduled Meds: . albuterol  2.5 mg Nebulization QID  . atorvastatin  10 mg Oral QHS  . enoxaparin (LOVENOX) injection  40 mg Subcutaneous Daily  . ipratropium  0.5 mg Nebulization QID  . levofloxacin (LEVAQUIN) IV  500 mg Intravenous QHS  . lisinopril  40 mg Oral QPM  . mirtazapine  15 mg Oral QHS  . nicotine  21 mg Transdermal Daily  . predniSONE  50 mg Oral Q breakfast  .  QUEtiapine  400 mg Oral QHS   Continuous Infusions: . sodium chloride 75 mL/hr at 07/21/13 0603    Principal Problem:   COPD exacerbation Active Problems:   NEUROFIBROMATOSIS   TOBACCO DEPENDENCE   HYPERTENSION, BENIGN SYSTEMIC   Adenocarcinoma LUL, hx of   Hypoxemia   CHF (congestive heart failure)   Hyperlipidemia LDL goal < 100   Acute bronchitis    Time spent: > 365 minutes     Esperanza Sheets  [facility] Pager [phone]. If 7PM-7AM, please contact night-coverage at [email or web address], password [facility] 07/21/2013, 8:55 AM  LOS: 2 days

## 2013-07-22 DIAGNOSIS — J9620 Acute and chronic respiratory failure, unspecified whether with hypoxia or hypercapnia: Secondary | ICD-10-CM

## 2013-07-22 DIAGNOSIS — Z9981 Dependence on supplemental oxygen: Secondary | ICD-10-CM

## 2013-07-22 MED ORDER — ALBUTEROL SULFATE (5 MG/ML) 0.5% IN NEBU: 2.5000 mg | INHALATION_SOLUTION | RESPIRATORY_TRACT | Status: AC | PRN

## 2013-07-22 MED ORDER — ACETAMINOPHEN 325 MG PO TABS: 650.0000 mg | ORAL_TABLET | Freq: Four times a day (QID) | ORAL | Status: DC | PRN

## 2013-07-22 MED ORDER — ATORVASTATIN CALCIUM 10 MG PO TABS: 10.0000 mg | ORAL_TABLET | Freq: Every day | ORAL | Status: DC

## 2013-07-22 MED ORDER — ALPRAZOLAM 0.5 MG PO TABS: 0.5000 mg | ORAL_TABLET | Freq: Four times a day (QID) | ORAL | Status: DC | PRN

## 2013-07-22 MED ORDER — OXYCODONE-ACETAMINOPHEN 5-325 MG PO TABS: 2.0000 | ORAL_TABLET | Freq: Four times a day (QID) | ORAL | Status: DC | PRN

## 2013-07-22 MED ORDER — FLUTICASONE-SALMETEROL 250-50 MCG/DOSE IN AEPB: 1.0000 | INHALATION_SPRAY | Freq: Two times a day (BID) | RESPIRATORY_TRACT | Status: AC

## 2013-07-22 MED ORDER — MIRTAZAPINE 15 MG PO TABS: 15.0000 mg | ORAL_TABLET | Freq: Every day | ORAL | Status: DC

## 2013-07-22 MED ORDER — LEVOFLOXACIN 500 MG PO TABS: 500.0000 mg | ORAL_TABLET | Freq: Every day | ORAL | Status: DC

## 2013-07-22 MED ORDER — LISINOPRIL 40 MG PO TABS: 40.0000 mg | ORAL_TABLET | Freq: Every evening | ORAL | Status: AC

## 2013-07-22 MED ORDER — PREDNISONE 10 MG PO TABS: 10.0000 mg | ORAL_TABLET | Freq: Every day | ORAL | Status: DC

## 2013-07-22 MED ORDER — IPRATROPIUM-ALBUTEROL 0.5-2.5 (3) MG/3ML IN SOLN: 3.0000 mL | RESPIRATORY_TRACT | Status: DC | PRN

## 2013-07-22 NOTE — Discharge Summary (Signed)
Physician Discharge Summary  [name] ZOX:[id number] DOB: 05-Mar-1951 DOA: 07/19/2013  PCP: Bufford Spikes, DO  Admit date: 07/19/2013 Discharge date: 07/22/2013  Time spent: >35 minutes  Recommendations for Outpatient Follow-up:  F/u with PCP in 1-2 weeks  Discharge Diagnoses:  Principal Problem:   COPD exacerbation Active Problems:   NEUROFIBROMATOSIS   TOBACCO DEPENDENCE   HYPERTENSION, BENIGN SYSTEMIC   Adenocarcinoma LUL, hx of   Hypoxemia   CHF (congestive heart failure)   Hyperlipidemia LDL goal < 100   Acute bronchitis   Discharge Condition: stable   Diet recommendation: heart healthy   Filed Weights   07/20/13 0235 07/22/13 0500  Weight: 55.43 kg (122 lb 3.2 oz) 58.9 kg (129 lb 13.6 oz)    History of present illness:  62 y/o with chronic respiratory failure on home O2, tobacco use, COPD, HTN, CHF presented with SOB and admitetd with COPD exacerbation   Hospital Course:  1. COPD exacerbation, chronic respiratory failure on home O2;  -improved on IV atx, steroids, bronchodilators, oxygen'; taper steroids upon d/c   2. HTN stable; cont home regimen  3. Chronic CHF ? diastolic HF; echo (2013): LVEF 60%, LVH; clinically euvolemic; cont home regimen  4. Tobacco dependence: cessation advised.  5. Back pain: States that she takes percocet 7.5 mg q 6 hrs at home. restart home dose  6. History of adenocarcinoma of the lung: smoking cessation advised.     Procedures:  None  (i.e. Studies not automatically included, echos, thoracentesis, etc; not x-rays)  Consultations:  None   Discharge Exam: Filed Vitals:   07/22/13 0742  BP:   Pulse: 100  Temp:   Resp: 18    General: alert Cardiovascular: s1,s2 rrr Respiratory: CTA BL  Discharge Instructions  Discharge Orders   Future Appointments Provider Department Dept Phone   12/01/2013 10:00 AM [address] [facility] [phone]   Liquids only 4 hours prior to your exam. Any  medications can be taken as usual. Please arrive 15 min prior to your scheduled exam time.   12/04/2013 1:00 PM Lurline Hare, MD [facility] [phone]   Future Orders Complete By Expires   Diet - low sodium heart healthy  As directed    Discharge instructions  As directed    Comments:     Please follow up with primary care doctor in 1-2 weeks   Increase activity slowly  As directed        Medication List    STOP taking these medications       acetaminophen 500 MG tablet  Commonly known as:  TYLENOL      TAKE these medications       albuterol (5 MG/ML) 0.5% nebulizer solution  Commonly known as:  PROVENTIL  Take 0.5 mLs (2.5 mg total) by nebulization every 4 (four) hours as needed for wheezing or shortness of breath.     ALPRAZolam 0.5 MG tablet  Commonly known as:  XANAX  Take 1 tablet (0.5 mg total) by mouth every 6 (six) hours as needed for sleep or anxiety.     aspirin-acetaminophen-caffeine 250-250-65 MG per tablet  Commonly known as:  EXCEDRIN MIGRAINE  Take 2 tablets by mouth every 6 (six) hours as needed for pain.     atorvastatin 10 MG tablet  Commonly known as:  LIPITOR  Take 1 tablet (10 mg total) by mouth at bedtime.     Fluticasone-Salmeterol 250-50 MCG/DOSE Aepb  Commonly known as:  ADVAIR DISKUS  Inhale 1 puff into the lungs 2 (two) times daily.     ipratropium-albuterol 0.5-2.5 (3) MG/3ML Soln  Commonly known as:  DUONEB  Take 3 mLs by nebulization every 4 (four) hours as needed.     levofloxacin 500 MG tablet  Commonly known as:  LEVAQUIN  Take 1 tablet (500 mg total) by mouth daily.     lisinopril 40 MG tablet  Commonly known as:  PRINIVIL,ZESTRIL  Take 1 tablet (40 mg total) by mouth every evening.     mirtazapine 15 MG tablet  Commonly known as:  REMERON  Take 1 tablet (15 mg total) by mouth at bedtime. Take one tablet by mouth at bedtime for rest     oxyCODONE-acetaminophen 5-325 MG per tablet   Commonly known as:  PERCOCET/ROXICET  Take 2 tablets by mouth every 6 (six) hours as needed for severe pain.     predniSONE 10 MG tablet  Commonly known as:  DELTASONE  Take 1 tablet (10 mg total) by mouth daily with breakfast.     QUEtiapine 200 MG tablet  Commonly known as:  SEROQUEL  Take 400 mg by mouth at bedtime.       Allergies  Allergen Reactions  . Penicillins Rash       Follow-up Information   Follow up with REED, TIFFANY, DO. Schedule an appointment as soon as possible for a visit in 1 week.   Specialty:  Geriatric Medicine   Contact information:   [address] [place] [address] [phone]        The results of significant diagnostics from this hospitalization (including imaging, microbiology, ancillary and laboratory) are listed below for reference.    Significant Diagnostic Studies: Dg Chest 2 View (if Patient Has Fever And/or Copd)  07/19/2013   CLINICAL DATA:  Shortness of breath, chest pain  EXAM: CHEST  2 VIEW  COMPARISON:  Prior radiograph and CT from 05/23/2013  FINDINGS: Cardiac and mediastinal silhouettes are stable in size and contour, and remain within normal limit.  Lungs are mildly hypoinflated. Findings consistent with COPD are present. No focal infiltrate or pulmonary edema. No pleural effusion. Minimal linear opacity is seen in the region of previously seen irregular opacity within the left upper lobe, likely atelectasis or scarring. Irregular biapical pleural thickening noted. No pneumothorax.  Osseous structures are unchanged.  IMPRESSION: COPD.  No acute cardiopulmonary process.   Electronically Signed   By: Rise Mu M.D.   On: 07/19/2013 23:54    Microbiology: No results found for this or any previous visit (from the past 240 hour(s)).   Labs: Basic Metabolic Panel:  Recent Labs Lab 07/19/13 2239 07/20/13 0435  NA 147* 142  K 3.0* 3.5  CL 108 107  CO2 26 22  GLUCOSE 199* 173*  BUN 18 14  CREATININE 0.75 0.58   CALCIUM 8.2* 8.1*   Liver Function Tests: No results found for this basename: AST, ALT, ALKPHOS, BILITOT, PROT, ALBUMIN,  in the last 168 hours No results found for this basename: LIPASE, AMYLASE,  in the last 168 hours No results found for this basename: AMMONIA,  in the last 168 hours CBC:  Recent Labs Lab 07/19/13 2239 07/20/13 0435  WBC 5.0 6.6  HGB 11.9* 10.8*  HCT 35.8* 32.9*  MCV 97.0 96.8  PLT 220 226   Cardiac Enzymes: No results found for this basename: CKTOTAL, CKMB, CKMBINDEX, TROPONINI,  in the last 168 hours BNP: BNP (last 3 results)  Recent  Labs  11/04/12 2309 05/23/13 1320 07/19/13 2239  PROBNP 162.4* 635.7* 94.7   CBG: No results found for this basename: GLUCAP,  in the last 168 hours     Signed:  Jonette Mate N  [facility] 07/22/2013, 10:24 AM

## 2013-07-23 ENCOUNTER — Telehealth: Payer: Self-pay | Admitting: Internal Medicine

## 2013-07-23 NOTE — Telephone Encounter (Signed)
i spoke with pt. She reports she is going to f/u with her PCP and if she recs she follows up with [place] then she will call for an appt. Nothing further needed

## 2013-07-24 ENCOUNTER — Other Ambulatory Visit: Payer: Self-pay | Admitting: Nurse Practitioner

## 2013-07-28 ENCOUNTER — Other Ambulatory Visit: Payer: Self-pay | Admitting: *Deleted

## 2013-07-30 ENCOUNTER — Other Ambulatory Visit: Payer: Self-pay | Admitting: Internal Medicine

## 2013-08-03 ENCOUNTER — Other Ambulatory Visit: Payer: Self-pay | Admitting: Internal Medicine

## 2013-08-04 ENCOUNTER — Other Ambulatory Visit: Payer: Self-pay | Admitting: *Deleted

## 2013-08-04 MED ORDER — OXYCODONE-ACETAMINOPHEN 5-325 MG PO TABS: 2.0000 | ORAL_TABLET | Freq: Four times a day (QID) | ORAL | Status: DC | PRN

## 2013-08-04 NOTE — Telephone Encounter (Signed)
Patient was seen in ER and Hydrocodone dosage was changed at that time and Rx given. Patient states she did not get this filled and will bring to [place] to get a Rx from [place]. Patient has an appointment on 08/14/2013 to see Dr. Renato Gails patient can discuss medication changes at that time.

## 2013-08-14 ENCOUNTER — Ambulatory Visit (INDEPENDENT_AMBULATORY_CARE_PROVIDER_SITE_OTHER): Payer: Medicare Other | Admitting: Internal Medicine

## 2013-08-14 ENCOUNTER — Encounter: Payer: Self-pay | Admitting: Internal Medicine

## 2013-08-14 VITALS — BP 128/76 | HR 84 | Temp 96.9°F | Resp 20 | Ht 63.0 in | Wt 123.0 lb

## 2013-08-14 DIAGNOSIS — H9209 Otalgia, unspecified ear: Secondary | ICD-10-CM

## 2013-08-14 DIAGNOSIS — C3490 Malignant neoplasm of unspecified part of unspecified bronchus or lung: Secondary | ICD-10-CM

## 2013-08-14 DIAGNOSIS — J438 Other emphysema: Secondary | ICD-10-CM

## 2013-08-14 DIAGNOSIS — H9201 Otalgia, right ear: Secondary | ICD-10-CM

## 2013-08-14 DIAGNOSIS — J439 Emphysema, unspecified: Secondary | ICD-10-CM

## 2013-08-14 DIAGNOSIS — J441 Chronic obstructive pulmonary disease with (acute) exacerbation: Secondary | ICD-10-CM

## 2013-08-14 DIAGNOSIS — F17200 Nicotine dependence, unspecified, uncomplicated: Secondary | ICD-10-CM

## 2013-08-14 DIAGNOSIS — F411 Generalized anxiety disorder: Secondary | ICD-10-CM

## 2013-08-14 DIAGNOSIS — E785 Hyperlipidemia, unspecified: Secondary | ICD-10-CM

## 2013-08-14 MED ORDER — OXYCODONE-ACETAMINOPHEN 5-325 MG PO TABS: 2.0000 | ORAL_TABLET | Freq: Four times a day (QID) | ORAL | Status: DC | PRN

## 2013-08-14 NOTE — Patient Instructions (Signed)
Continue trying to stop smoking--use progressively lower nicotine content electronic cigarettes.

## 2013-08-14 NOTE — Progress Notes (Signed)
Patient ID: [name], female   DOB: [birth year], 63 y.o.   MRN: [id number]   Location:  [facility]  Code Status: DNR  Allergies  Allergen Reactions  . Penicillins Rash    Chief Complaint  Patient presents with  . Hospitalization Follow-up    rt ear pain, medication changed in the hospital    HPI: Patient is a 63 y.o. white female seen in the office today for hospital f/u.  She had another COPD exacerbation.  She typically does not use her inhalers as directed at home due to cost so this is not surprising.  I have tried to get her samples, but when she runs out she still cannot pay for additional meds.  This hospitalization she was treated with abx, steroid taper and bronchodilators.      Was given 7.5mg  dose of percocet and says she has pain and has been using three of those pills instead.    Right ear is aching.  It also rings.  Has her chronic headaches which are unchanged.  I have not been able to get her to stop continuously using her excedrin migraine.  Review of Systems:  Review of Systems  Constitutional: Positive for malaise/fatigue. Negative for fever, chills and weight loss.  HENT: Positive for ear pain and tinnitus. Negative for congestion, ear discharge and hearing loss.   Eyes: Negative for blurred vision.  Respiratory: Positive for shortness of breath.   Cardiovascular: Negative for chest pain.  Gastrointestinal: Negative for heartburn, abdominal pain, blood in stool and melena.  Genitourinary: Negative for dysuria.  Musculoskeletal: Negative for falls and myalgias.  Skin: Negative for rash.       neurofibromas  Neurological: Positive for headaches. Negative for dizziness, loss of consciousness and weakness.  Endo/Heme/Allergies: Bruises/bleeds easily.  Psychiatric/Behavioral: Negative for depression. The patient is nervous/anxious.      Past Medical History  Diagnosis Date  . DJD (degenerative joint disease)   .  COPD (chronic obstructive pulmonary disease)   . Tobacco dependence 2000    low back  . Avascular necrosis   . Osteoporosis   . Substance abuse   . Neurofibromatosis   . Hyperplastic colonic polyp   . CHF (congestive heart failure)   . Abnormal weight loss   . Essential hypertension, benign   . Depressive disorder, not elsewhere classified   . Constipation   . Low back pain   . Anxiety   . Lung cancer 2012    LUL  . Shortness of breath   . Traumatic pneumothorax     history of  . History of radiation therapy 02/06/11 -02/17/11    LUL    Past Surgical History  Procedure Laterality Date  . Laminectomy  02/2003  . Back surgery      multiple  . Tubal ligation    . Vesicovaginal fistula closure w/ tah    . Cholecystectomy    . Tonsilectomy, adenoidectomy, bilateral myringotomy and tubes    . Hip surgery      right hip, metal plate    Social History:   reports that she has been smoking Cigarettes.  She has a 17.5 pack-year smoking history. She has never used smokeless tobacco. She reports that she does not drink alcohol or use illicit drugs.  Family History  Problem Relation Age of Onset  . Lung cancer Father   . Emphysema Mother   . Neurofibromatosis Mother   . Liver disease  Medications: Patient's Medications  New Prescriptions   No medications on file  Previous Medications   ALBUTEROL (PROVENTIL) (5 MG/ML) 0.5% NEBULIZER SOLUTION    Take 0.5 mLs (2.5 mg total) by nebulization every 4 (four) hours as needed for wheezing or shortness of breath.   ALPRAZOLAM (XANAX) 0.5 MG TABLET    TAKE 1 TABLET BY MOUTH EVERY 6 HOURS AS NEEDED FOR ANXIETY   ASPIRIN-ACETAMINOPHEN-CAFFEINE (EXCEDRIN MIGRAINE) 250-250-65 MG PER TABLET    Take 2 tablets by mouth every 6 (six) hours as needed for pain.   ATORVASTATIN (LIPITOR) 10 MG TABLET    Take 1 tablet (10 mg total) by mouth at bedtime.   FLUTICASONE-SALMETEROL (ADVAIR DISKUS) 250-50 MCG/DOSE AEPB    Inhale 1 puff into the lungs 2  (two) times daily.   IPRATROPIUM-ALBUTEROL (DUONEB) 0.5-2.5 (3) MG/3ML SOLN    Take 3 mLs by nebulization every 4 (four) hours as needed.   LEVOFLOXACIN (LEVAQUIN) 500 MG TABLET    Take 1 tablet (500 mg total) by mouth daily.   LISINOPRIL (PRINIVIL,ZESTRIL) 40 MG TABLET    Take 1 tablet (40 mg total) by mouth every evening.   MIRTAZAPINE (REMERON) 15 MG TABLET    Take 1 tablet (15 mg total) by mouth at bedtime. Take one tablet by mouth at bedtime for rest   MIRTAZAPINE (REMERON) 15 MG TABLET    TAKE 1 TABLET BY MOUTH AT BEDTIME FOR REST   OXYCODONE-ACETAMINOPHEN (PERCOCET/ROXICET) 5-325 MG PER TABLET    Take 2 tablets by mouth every 6 (six) hours as needed for severe pain.   PREDNISONE (DELTASONE) 10 MG TABLET    Take 1 tablet (10 mg total) by mouth daily with breakfast.   QUETIAPINE (SEROQUEL) 200 MG TABLET    Take 400 mg by mouth at bedtime.   QUETIAPINE (SEROQUEL) 200 MG TABLET    TAKE 2 TABLETS BY MOUTH EVERY NIGHT AT BEDTIME  Modified Medications   No medications on file  Discontinued Medications   No medications on file     Physical Exam: Filed Vitals:   08/14/13 0838  BP: 128/76  Pulse: 84  Temp: 96.9 F (36.1 C)  TempSrc: Oral  Resp: 20  Height: 5\' 3"  (1.6 m)  Weight: 123 lb (55.792 kg)  SpO2: 96%  Physical Exam  Constitutional: She is oriented to person, place, and time. No distress.  HENT:  Head: Normocephalic and atraumatic.  Right ear w/o erythema of TM or retraction, some calcification of TM and mild erythema of canal, no cervical adenopathy or periauricular adenopathy to explain pain  Cardiovascular: Normal rate, regular rhythm, normal heart sounds and intact distal pulses.   Pulmonary/Chest: Effort normal.  O2 dependent 5L sitting in room  Abdominal: Soft. Bowel sounds are normal. She exhibits no distension. There is no tenderness.  Musculoskeletal: Normal range of motion.  Neurological: She is alert and oriented to person, place, and time.  Skin: Skin is warm.    Neurofibromas cover body     Labs reviewed: Basic Metabolic Panel:  Recent Labs  01/28/13 0154  05/23/13 1320 07/19/13 2239 07/20/13 0435  NA  --   < > 145 147* 142  K  --   < > 4.8 3.0* 3.5  CL  --   < > 109 108 107  CO2  --   < > 28 26 22   GLUCOSE  --   < > 90 199* 173*  BUN  --   < > 9 18 14   CREATININE 0.80  < >  0.71 0.75 0.58  CALCIUM  --   < > 9.0 8.2* 8.1*  TSH 0.280*  --   --   --   --   < > = values in this interval not displayed. CBC:  Recent Labs  12/26/12 1123 01/27/13 1953  02/10/13 1532 05/23/13 1320 07/19/13 2239 07/20/13 0435  WBC 6.6 6.2  < > 11.2* 5.8 5.0 6.6  NEUTROABS 5.5 4.1  --  6.0  --   --   --   HGB 12.4 12.0  < > 12.1 11.7* 11.9* 10.8*  HCT 36.8 36.3  < > 36.4 34.8* 35.8* 32.9*  MCV 93 91.4  < > 84 96.1 97.0 96.8  PLT  --  239  < >  --  215 220 226  < > = values in this interval not displayed. Lipid Panel:  Recent Labs  12/26/12 1123  HDL 43  LDLCALC 131*  TRIG 199*  CHOLHDL 5.0*   Lab Results  Component Value Date   HGBA1C  Value: 4.8 (NOTE)   The ADA recommends the following therapeutic goals for glycemic   control related to Hgb A1C measurement:   Goal of Therapy:   < 7.0% Hgb A1C   Action Suggested:  > 8.0% Hgb A1C   Ref:  Diabetes Care, 22, Suppl. 1, 1999 11/21/2007    Assessment/Plan 1. COPD exacerbation -resolved -sputum has returned to normal -is now using electronic cigarette to help her quit smoking regular cigarettes -does have her advair and albuterol  2. COPD (chronic obstructive pulmonary disease) with emphysema -end stages with frequent exacerbations but cannot afford expensive meds -cont advair and albuterol  3. Lung cancer, left -has been treated with XRT and was said to be stable  4. Tobacco use disorder -is trying electronic cigarette--explained she needs to taper the nicotine doses  5. Anxiety state, unspecified -improved today -cont anxiolytics due to dyspnea  6.  Right ear pain -no clear  etiology on exam--no evidence of otitis media -pain meds resumed as she previously took  Labs/tests ordered:  Cmp, lipid panel before next visit Next appt:  3 mos and prn

## 2013-08-15 ENCOUNTER — Encounter: Payer: Self-pay | Admitting: Internal Medicine

## 2013-08-19 ENCOUNTER — Other Ambulatory Visit: Payer: Self-pay | Admitting: Internal Medicine

## 2013-08-21 ENCOUNTER — Ambulatory Visit: Payer: Self-pay | Admitting: Internal Medicine

## 2013-09-01 ENCOUNTER — Encounter (HOSPITAL_COMMUNITY): Payer: Self-pay | Admitting: Emergency Medicine

## 2013-09-01 ENCOUNTER — Inpatient Hospital Stay (HOSPITAL_COMMUNITY)
Admission: EM | Admit: 2013-09-01 | Discharge: 2013-09-03 | DRG: 190 | Disposition: A | Discharge status: Home / Self Care | Payer: Medicare Other | Attending: Internal Medicine | Admitting: Internal Medicine

## 2013-09-01 ENCOUNTER — Emergency Department (HOSPITAL_COMMUNITY): Payer: Medicare Other

## 2013-09-01 DIAGNOSIS — J4489 Other specified chronic obstructive pulmonary disease: Secondary | ICD-10-CM

## 2013-09-01 DIAGNOSIS — R0781 Pleurodynia: Secondary | ICD-10-CM

## 2013-09-01 DIAGNOSIS — Z9981 Dependence on supplemental oxygen: Secondary | ICD-10-CM

## 2013-09-01 DIAGNOSIS — I10 Essential (primary) hypertension: Secondary | ICD-10-CM

## 2013-09-01 DIAGNOSIS — J209 Acute bronchitis, unspecified: Secondary | ICD-10-CM

## 2013-09-01 DIAGNOSIS — L02412 Cutaneous abscess of left axilla: Secondary | ICD-10-CM

## 2013-09-01 DIAGNOSIS — I509 Heart failure, unspecified: Secondary | ICD-10-CM

## 2013-09-01 DIAGNOSIS — F1120 Opioid dependence, uncomplicated: Secondary | ICD-10-CM

## 2013-09-01 DIAGNOSIS — I739 Peripheral vascular disease, unspecified: Secondary | ICD-10-CM

## 2013-09-01 DIAGNOSIS — C3490 Malignant neoplasm of unspecified part of unspecified bronchus or lung: Secondary | ICD-10-CM

## 2013-09-01 DIAGNOSIS — Z923 Personal history of irradiation: Secondary | ICD-10-CM

## 2013-09-01 DIAGNOSIS — D126 Benign neoplasm of colon, unspecified: Secondary | ICD-10-CM

## 2013-09-01 DIAGNOSIS — Z862 Personal history of diseases of the blood and blood-forming organs and certain disorders involving the immune mechanism: Secondary | ICD-10-CM

## 2013-09-01 DIAGNOSIS — J9620 Acute and chronic respiratory failure, unspecified whether with hypoxia or hypercapnia: Secondary | ICD-10-CM

## 2013-09-01 DIAGNOSIS — IMO0002 Reserved for concepts with insufficient information to code with codable children: Secondary | ICD-10-CM

## 2013-09-01 DIAGNOSIS — J441 Chronic obstructive pulmonary disease with (acute) exacerbation: Principal | ICD-10-CM

## 2013-09-01 DIAGNOSIS — F329 Major depressive disorder, single episode, unspecified: Secondary | ICD-10-CM

## 2013-09-01 DIAGNOSIS — F411 Generalized anxiety disorder: Secondary | ICD-10-CM

## 2013-09-01 DIAGNOSIS — F3289 Other specified depressive episodes: Secondary | ICD-10-CM

## 2013-09-01 DIAGNOSIS — Z720 Tobacco use: Secondary | ICD-10-CM

## 2013-09-01 DIAGNOSIS — E875 Hyperkalemia: Secondary | ICD-10-CM

## 2013-09-01 DIAGNOSIS — R51 Headache: Secondary | ICD-10-CM

## 2013-09-01 DIAGNOSIS — Z8639 Personal history of other endocrine, nutritional and metabolic disease: Secondary | ICD-10-CM

## 2013-09-01 DIAGNOSIS — R519 Headache, unspecified: Secondary | ICD-10-CM | POA: Diagnosis present

## 2013-09-01 DIAGNOSIS — J449 Chronic obstructive pulmonary disease, unspecified: Secondary | ICD-10-CM

## 2013-09-01 DIAGNOSIS — I5031 Acute diastolic (congestive) heart failure: Secondary | ICD-10-CM | POA: Diagnosis present

## 2013-09-01 DIAGNOSIS — R0902 Hypoxemia: Secondary | ICD-10-CM

## 2013-09-01 DIAGNOSIS — Q8500 Neurofibromatosis, unspecified: Secondary | ICD-10-CM

## 2013-09-01 DIAGNOSIS — M545 Low back pain, unspecified: Secondary | ICD-10-CM

## 2013-09-01 DIAGNOSIS — F1910 Other psychoactive substance abuse, uncomplicated: Secondary | ICD-10-CM

## 2013-09-01 DIAGNOSIS — F17200 Nicotine dependence, unspecified, uncomplicated: Secondary | ICD-10-CM

## 2013-09-01 DIAGNOSIS — E785 Hyperlipidemia, unspecified: Secondary | ICD-10-CM

## 2013-09-01 DIAGNOSIS — R55 Syncope and collapse: Secondary | ICD-10-CM

## 2013-09-01 DIAGNOSIS — Z85118 Personal history of other malignant neoplasm of bronchus and lung: Secondary | ICD-10-CM

## 2013-09-01 DIAGNOSIS — G4700 Insomnia, unspecified: Secondary | ICD-10-CM

## 2013-09-01 HISTORY — DX: Other chronic pain: R51.9

## 2013-09-01 HISTORY — DX: Other chronic pain: G89.29

## 2013-09-01 HISTORY — DX: Headache: R51

## 2013-09-01 HISTORY — DX: Dependence on supplemental oxygen: Z99.81

## 2013-09-01 LAB — BASIC METABOLIC PANEL
BUN: 8 mg/dL (ref 6–23)
CALCIUM: 8.6 mg/dL (ref 8.4–10.5)
CO2: 21 meq/L (ref 19–32)
Chloride: 108 mEq/L (ref 96–112)
Creatinine, Ser: 0.64 mg/dL (ref 0.50–1.10)
GFR calc Af Amer: >90 mL/min (ref >90)
Glucose, Bld: 103 mg/dL — ABNORMAL HIGH (ref 70–99)
Potassium: 4.5 mEq/L (ref 3.7–5.3)
Sodium: 145 mEq/L (ref 137–147)

## 2013-09-01 LAB — CBC WITH DIFFERENTIAL/PLATELET
Basophils Absolute: 0 10*3/uL (ref 0.0–0.1)
Basophils Relative: 0 % (ref 0–1)
EOS ABS: 0 10*3/uL (ref 0.0–0.7)
Eosinophils Relative: 1 % (ref 0–5)
HCT: 33.1 % — ABNORMAL LOW (ref 36.0–46.0)
HEMOGLOBIN: 11.3 g/dL — AB (ref 12.0–15.0)
LYMPHS ABS: 0.9 10*3/uL (ref 0.7–4.0)
Lymphocytes Relative: 11 % — ABNORMAL LOW (ref 12–46)
MCH: 33 pg (ref 26.0–34.0)
MCHC: 34.1 g/dL (ref 30.0–36.0)
MCV: 96.8 fL (ref 78.0–100.0)
MONOS PCT: 7 % (ref 3–12)
Monocytes Absolute: 0.5 10*3/uL (ref 0.1–1.0)
NEUTROS PCT: 82 % — AB (ref 43–77)
Neutro Abs: 6.8 10*3/uL (ref 1.7–7.7)
PLATELETS: 182 10*3/uL (ref 150–400)
RBC: 3.42 MIL/uL — ABNORMAL LOW (ref 3.87–5.11)
RDW: 13.8 % (ref 11.5–15.5)
WBC: 8.4 10*3/uL (ref 4.0–10.5)

## 2013-09-01 LAB — URINALYSIS W MICROSCOPIC + REFLEX CULTURE
GLUCOSE, UA: NEGATIVE mg/dL
Hgb urine dipstick: NEGATIVE
Ketones, ur: NEGATIVE mg/dL
LEUKOCYTES UA: NEGATIVE
NITRITE: NEGATIVE
PH: 5 (ref 5.0–8.0)
Protein, ur: 30 mg/dL — AB
SPECIFIC GRAVITY, URINE: 1.033 — AB (ref 1.005–1.030)
Urobilinogen, UA: 0.2 mg/dL (ref 0.0–1.0)

## 2013-09-01 LAB — TROPONIN I: Troponin I: <0.3 ng/mL (ref <0.30)

## 2013-09-01 LAB — CG4 I-STAT (LACTIC ACID): Lactic Acid, Venous: 1.87 mmol/L (ref 0.5–2.2)

## 2013-09-01 LAB — PRO B NATRIURETIC PEPTIDE: PRO B NATRI PEPTIDE: 398.9 pg/mL — AB (ref 0–125)

## 2013-09-01 MED ORDER — HYDROCODONE-ACETAMINOPHEN 5-325 MG PO TABS: 1.0000 | ORAL_TABLET | ORAL | Status: DC | PRN

## 2013-09-01 MED ORDER — MOMETASONE FURO-FORMOTEROL FUM 100-5 MCG/ACT IN AERO
2.0000 | INHALATION_SPRAY | Freq: Two times a day (BID) | RESPIRATORY_TRACT | Status: DC
  Administered 2013-09-01 – 2013-09-03 (×4): 2 via RESPIRATORY_TRACT
  Filled 2013-09-01: qty 8.8

## 2013-09-01 MED ORDER — ONDANSETRON HCL 4 MG PO TABS: 4.0000 mg | ORAL_TABLET | Freq: Four times a day (QID) | ORAL | Status: DC | PRN

## 2013-09-01 MED ORDER — ACETAMINOPHEN 650 MG RE SUPP: 650.0000 mg | Freq: Four times a day (QID) | RECTAL | Status: DC | PRN

## 2013-09-01 MED ORDER — ALBUTEROL SULFATE (5 MG/ML) 0.5% IN NEBU
2.5000 mg | INHALATION_SOLUTION | RESPIRATORY_TRACT | Status: DC | PRN
  Filled 2013-09-01: qty 0.5

## 2013-09-01 MED ORDER — MORPHINE SULFATE 2 MG/ML IJ SOLN: 1.0000 mg | INTRAMUSCULAR | Status: DC | PRN

## 2013-09-01 MED ORDER — ONDANSETRON HCL 4 MG/2ML IJ SOLN: 4.0000 mg | Freq: Four times a day (QID) | INTRAMUSCULAR | Status: DC | PRN

## 2013-09-01 MED ORDER — LORAZEPAM 1 MG PO TABS
0.5000 mg | ORAL_TABLET | Freq: Once | ORAL | Status: AC
  Administered 2013-09-01: 0.5 mg via ORAL
  Filled 2013-09-01: qty 1

## 2013-09-01 MED ORDER — GUAIFENESIN-DM 100-10 MG/5ML PO SYRP
5.0000 mL | ORAL_SOLUTION | ORAL | Status: DC | PRN
Start: 2013-09-01 — End: 2013-09-03
  Filled 2013-09-01: qty 5

## 2013-09-01 MED ORDER — ALBUTEROL (5 MG/ML) CONTINUOUS INHALATION SOLN
10.0000 mg/h | INHALATION_SOLUTION | Freq: Once | RESPIRATORY_TRACT | Status: AC
  Administered 2013-09-01: 10 mg/h via RESPIRATORY_TRACT
  Filled 2013-09-01: qty 20

## 2013-09-01 MED ORDER — IPRATROPIUM BROMIDE 0.02 % IN SOLN
1.0000 mg | Freq: Once | RESPIRATORY_TRACT | Status: AC
  Administered 2013-09-01: 1 mg via RESPIRATORY_TRACT
  Filled 2013-09-01: qty 5

## 2013-09-01 MED ORDER — GUAIFENESIN ER 600 MG PO TB12
1200.0000 mg | ORAL_TABLET | Freq: Two times a day (BID) | ORAL | Status: DC
  Administered 2013-09-01 – 2013-09-03 (×4): 1200 mg via ORAL
  Filled 2013-09-01 (×5): qty 2

## 2013-09-01 MED ORDER — OXYCODONE-ACETAMINOPHEN 5-325 MG PO TABS
1.0000 | ORAL_TABLET | Freq: Once | ORAL | Status: AC
  Administered 2013-09-01: 1 via ORAL
  Filled 2013-09-01: qty 1

## 2013-09-01 MED ORDER — MIRTAZAPINE 15 MG PO TABS
15.0000 mg | ORAL_TABLET | Freq: Every day | ORAL | Status: DC
  Administered 2013-09-01 – 2013-09-02 (×2): 15 mg via ORAL
  Filled 2013-09-01 (×3): qty 1

## 2013-09-01 MED ORDER — LISINOPRIL 40 MG PO TABS
40.0000 mg | ORAL_TABLET | Freq: Every evening | ORAL | Status: DC
  Administered 2013-09-01: 40 mg via ORAL
  Filled 2013-09-01 (×2): qty 1

## 2013-09-01 MED ORDER — HEPARIN SODIUM (PORCINE) 5000 UNIT/ML IJ SOLN
5000.0000 [IU] | Freq: Three times a day (TID) | INTRAMUSCULAR | Status: DC
  Administered 2013-09-01 – 2013-09-03 (×4): 5000 [IU] via SUBCUTANEOUS
  Filled 2013-09-01 (×8): qty 1

## 2013-09-01 MED ORDER — METHYLPREDNISOLONE SODIUM SUCC 125 MG IJ SOLR
125.0000 mg | Freq: Once | INTRAMUSCULAR | Status: AC
Start: 2013-09-01 — End: 2013-09-01
  Administered 2013-09-01: 125 mg via INTRAVENOUS
  Filled 2013-09-01: qty 2

## 2013-09-01 MED ORDER — METHYLPREDNISOLONE SODIUM SUCC 40 MG IJ SOLR
40.0000 mg | Freq: Two times a day (BID) | INTRAMUSCULAR | Status: DC
  Administered 2013-09-01 – 2013-09-03 (×4): 40 mg via INTRAVENOUS
  Filled 2013-09-01 (×6): qty 1

## 2013-09-01 MED ORDER — OXYCODONE-ACETAMINOPHEN 5-325 MG PO TABS
2.0000 | ORAL_TABLET | Freq: Four times a day (QID) | ORAL | Status: DC | PRN
  Administered 2013-09-01 – 2013-09-03 (×7): 2 via ORAL
  Filled 2013-09-01 (×7): qty 2

## 2013-09-01 MED ORDER — ALUM & MAG HYDROXIDE-SIMETH 200-200-20 MG/5ML PO SUSP: 30.0000 mL | Freq: Four times a day (QID) | ORAL | Status: DC | PRN

## 2013-09-01 MED ORDER — ACETAMINOPHEN 325 MG PO TABS: 650.0000 mg | ORAL_TABLET | Freq: Four times a day (QID) | ORAL | Status: DC | PRN

## 2013-09-01 MED ORDER — LEVOFLOXACIN IN D5W 500 MG/100ML IV SOLN
500.0000 mg | INTRAVENOUS | Status: DC
  Administered 2013-09-01 – 2013-09-02 (×2): 500 mg via INTRAVENOUS
  Filled 2013-09-01 (×3): qty 100

## 2013-09-01 MED ORDER — FUROSEMIDE 10 MG/ML IJ SOLN
40.0000 mg | Freq: Once | INTRAMUSCULAR | Status: AC
  Administered 2013-09-01: 40 mg via INTRAVENOUS
  Filled 2013-09-01: qty 4

## 2013-09-01 MED ORDER — ATORVASTATIN CALCIUM 10 MG PO TABS
10.0000 mg | ORAL_TABLET | Freq: Every day | ORAL | Status: DC
  Administered 2013-09-01 – 2013-09-02 (×2): 10 mg via ORAL
  Filled 2013-09-01 (×3): qty 1

## 2013-09-01 MED ORDER — ALPRAZOLAM 0.5 MG PO TABS
0.5000 mg | ORAL_TABLET | Freq: Four times a day (QID) | ORAL | Status: DC | PRN
  Administered 2013-09-01 – 2013-09-03 (×7): 0.5 mg via ORAL
  Filled 2013-09-01 (×5): qty 1
  Filled 2013-09-01: qty 2
  Filled 2013-09-01: qty 1

## 2013-09-01 MED ORDER — ALBUTEROL SULFATE (2.5 MG/3ML) 0.083% IN NEBU: 2.5000 mg | INHALATION_SOLUTION | RESPIRATORY_TRACT | Status: DC | PRN

## 2013-09-01 MED ORDER — QUETIAPINE FUMARATE 400 MG PO TABS
400.0000 mg | ORAL_TABLET | Freq: Every day | ORAL | Status: DC
  Administered 2013-09-01 – 2013-09-02 (×2): 400 mg via ORAL
  Filled 2013-09-01 (×3): qty 1

## 2013-09-01 NOTE — H&P (Signed)
[facility]                                                                                    Patient Demographics  [name], is a 63 y.o. female  MRN: [id number]   DOB - 10-23-1950  Admit Date - 09/01/2013  Outpatient Primary MD for the patient is REED, TIFFANY, DO  With History of -  Past Medical History  Diagnosis Date  . DJD (degenerative joint disease)   . COPD (chronic obstructive pulmonary disease)   . Tobacco dependence 2000    low back  . Avascular necrosis   . Osteoporosis   . Substance abuse   . Neurofibromatosis   . Hyperplastic colonic polyp   . CHF (congestive heart failure)   . Abnormal weight loss   . Essential hypertension, benign   . Depressive disorder, not elsewhere classified   . Constipation   . Low back pain   . Anxiety   . Lung cancer 2012    LUL  . Shortness of breath   . Traumatic pneumothorax     history of  . History of radiation therapy 02/06/11 -02/17/11    LUL  . Chronic pain   . On home O2     5L N/C chronic  . Chronic headache       Past Surgical History  Procedure Laterality Date  . Laminectomy  02/2003  . Back surgery      multiple  . Tubal ligation    . Vesicovaginal fistula closure w/ tah    . Cholecystectomy    . Tonsilectomy, adenoidectomy, bilateral myringotomy and tubes    . Hip surgery      right hip, metal plate   in for   Chief Complaint  Patient presents with  . Shortness of Breath  . Chest Pain    HPI  [name]  is a 63 y.o. 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 She presents today to the ED with complaints of SOB and wheezing and CP and HA. Per patient: SOB and wheezing began at 5:30 this morning and woke her from sleep. She tried 2 breathing treatments at home; with no relief. Pt has a productive cough that is producing yellow-green sputum,  which is normal for her, but the amount has increased. Pt smokes 10 cigarettes a day. Pt states CP feels like pounding, is a 7 out of 10, and is located on her left side and radiates to her axilla (in same area as recent abscess that was I&D). HA begins at crown and goes to neck; patient rates it a 7 out of 10 on pain scale. Pt was given 1 hour long Albuterol Neb and IV Solumedrol and was going to be d/c to home but became hypoxic walking to bedside commode.    Review of Systems    In addition to the HPI above, A full 10 point Review of Systems was done, except as stated above, all other Review of Systems were negative.  Social History  History  Substance Use Topics  . Smoking status: Current Every Day Smoker -- 0.50 packs/day for 35 years    Types: Cigarettes  . Smokeless tobacco: Never Used  . Alcohol Use: No   Family History Family History  Problem Relation Age of Onset  . Lung cancer Father   . Emphysema Mother   . Neurofibromatosis Mother   . Liver disease     Prior to Admission medications   Medication Sig Start Date End Date Taking? Authorizing Provider  albuterol (PROVENTIL) (5 MG/ML) 0.5% nebulizer solution Take 0.5 mLs (2.5 mg total) by nebulization every 4 (four) hours as needed for wheezing or shortness of breath. 07/22/13  Yes Kinnie Feil, MD  ALPRAZolam Duanne Moron) 0.5 MG tablet Take 0.5 mg by mouth every 6 (six) hours as needed for anxiety.   Yes Historical Provider, MD  aspirin-acetaminophen-caffeine (EXCEDRIN MIGRAINE) [id number] MG per tablet Take 2 tablets by mouth every 6 (six) hours as needed for pain.   Yes Historical Provider, MD  atorvastatin (LIPITOR) 10 MG tablet Take 1 tablet (10 mg total) by mouth at bedtime. 07/22/13  Yes Kinnie Feil, MD  Fluticasone-Salmeterol (ADVAIR DISKUS) 250-50 MCG/DOSE AEPB Inhale 1 puff into the lungs 2 (two) times daily. 07/22/13  Yes Kinnie Feil, MD  lisinopril (PRINIVIL,ZESTRIL) 40 MG tablet Take 1 tablet (40 mg total) by  mouth every evening. 07/22/13  Yes Kinnie Feil, MD  mirtazapine (REMERON) 15 MG tablet Take 1 tablet (15 mg total) by mouth at bedtime. Take one tablet by mouth at bedtime for rest 07/22/13  Yes Kinnie Feil, MD  oxyCODONE-acetaminophen (PERCOCET/ROXICET) 5-325 MG per tablet Take 2 tablets by mouth every 6 (six) hours as needed for severe pain. 08/14/13  Yes Tiffany L Reed, DO  QUEtiapine (SEROQUEL) 200 MG tablet Take 400 mg by mouth at bedtime.   Yes Historical Provider, MD    Allergies  Allergen Reactions  . Penicillins Rash   Physical Exam Vitals  Blood pressure 121/54, pulse 116, temperature 98.2 F (36.8 C), temperature source Oral, resp. rate 24, SpO2 91.00%.  General:  Upon exam; Pt appears very anxious and is shaking and fidgeting.   Psych:   Awake Alert, Oriented X 3. Neuro:   No F.N deficits, Strength 5/5 all 4 extremities, Sensation intact all 4 extremities. ENT:  Ears and Eyes appear Normal, Conjunctivae clear, PERRLA.  Respiratory:  Expiratory wheeze and decreased breath sounds throughout  Cardiac:  S1, S2 RR, tachycardic, No Gallops, Rubs or Murmurs, painful to palpation along left mid axilla Abdomen: Abdomen Soft, Non tender, No organomegaly appriciated Skin:  No Cyanosis, pt has diffuse Neurofibromas on truck and upper extremities and face.  Extremities:  No edema and warm and dry.   Data Review CBC  Recent Labs Lab 09/01/13 1118  WBC 8.4  HGB 11.3*  HCT 33.1*  PLT 182  MCV 96.8  MCH 33.0  MCHC 34.1  RDW 13.8  LYMPHSABS 0.9  MONOABS 0.5  EOSABS 0.0  BASOSABS 0.0   ------------------------------------------------------------------------------------------------------------------  Chemistries   Recent Labs Lab 09/01/13 1118  NA 145  K 4.5  CL 108  CO2 21  GLUCOSE 103*  BUN 8  CREATININE 0.64  CALCIUM 8.6   Cardiac Enzymes  Recent Labs Lab 09/01/13 1118  TROPONINI <0.30   Urinalysis    Component Value Date/Time   COLORURINE  YELLOW 09/01/2013 1326   APPEARANCEUR CLOUDY* 09/01/2013 1326   LABSPEC 1.033* 09/01/2013 1326   PHURINE 5.0 09/01/2013 1326  GLUCOSEU NEGATIVE 09/01/2013 1326   HGBUR NEGATIVE 09/01/2013 1326   BILIRUBINUR SMALL* 09/01/2013 1326   KETONESUR NEGATIVE 09/01/2013 1326   PROTEINUR 30* 09/01/2013 1326   UROBILINOGEN 0.2 09/01/2013 1326   NITRITE NEGATIVE 09/01/2013 1326   LEUKOCYTESUR NEGATIVE 09/01/2013 1326   Imaging results:   Dg Chest Port 1 View  09/01/2013   CLINICAL DATA:  SHORTNESS OF BREATH CHEST PAIN  EXAM: PORTABLE CHEST - 1 VIEW  COMPARISON:  DG CHEST 2 VIEW dated 05/23/2013; DG CHEST 2 VIEW dated 07/19/2013  FINDINGS: There is bilateral diffuse chronic interstitial thickening. There is no focal parenchymal opacity, pleural effusion, or pneumothorax. The heart and mediastinal contours are unremarkable. The osseous structures are unremarkable.  IMPRESSION: Bilateral diffuse chronic interstitial thickening. Mild superimposed atypical infection or edema cannot be excluded.   Electronically Signed   By: Kathreen Devoid   On: 09/01/2013 12:04   My personal review of EKG: Rhythm Sinus Tachycardia, Rate  104 /min, QTc 458, no Acute ST changes Assessment & Plan  Principal Problem:   COPD with acute exacerbation Active Problems:   ANXIETY   TOBACCO DEPENDENCE   HYPERTENSION, BENIGN SYSTEMIC   HEADACHE, CHRONIC   Chest pain, atypical  Principal Problem:  COPD with acute exacerbation:  -Pt has hx of COPD with multiple exacerbations  -Flu PCR pending  -Will start empiric Levaquin 500 mg Qday and IV Solumedrol for wheezing  -Will give Lasix 40 mg x 1 and reassess in am with BMP and CBC -Will continue home regimen of 5L of O2  -Will treat symptoms with Albuterol Nebs, Anti-Tussives, and Mucinex -Will continue to monitor O2 sats with routine vitals   Active Problems:   Pluertic Chest Pain:  -Likely secondary to recent abscess that per patient hx was I&D from L axilla in last few months  -EKG  does not demonstrates any acute changes  -First Troponin was negative we will defer cardiac testing.  Anxiety:  -Continue home regimen of Xanax 0.5 mg prn Q6  Tobacco Dependence:  -Counseled patient on the necessity to quit.  HTN:  -Currently stable; 121/54  -Will continue Lisinopril 40 mg Qday   Headache, Chronic:  -Continue Percocet home regimen   DVT Prophylaxis: Heparin AM Labs Ordered, also please review Full Orders Family Communication:   No Family at the bedside  Code Status:  Full  Likely DC to:  Home Condition: Stable but anxious   Time spent in minutes : 60    Nicklas, Eleanor A PA-S on 09/01/2013 at 3:29 PM Lucile Shutters, PA-S Between 7am to 7pm - Pager - [phone] After 7pm go to [email or web address] - password [email or web address] And look for the night coverage person covering me after hours  [place]  [phone]   Addendum  Patient seen and examined, chart and data base reviewed.  I agree with the above assessment and plan.  For full details please see Mrs. Lang Snow and Lucile Shutters, PA-S note.  I reviewed and addended the above note as appropriate.   Birdie Hopes, MD [facility] Pager: [phone] 09/02/2013, 9:04 AM

## 2013-09-01 NOTE — ED Notes (Signed)
Admitting at bedside 

## 2013-09-01 NOTE — ED Notes (Signed)
Pt c/o sob and is requesting graham crackers.  Sats of 88.  Pt notified to stop speaking on phone and calm down (pt angry that we are not allowing grandbabies to come to ED to visit). Once pt calmed down, Sats improved to 96%.

## 2013-09-01 NOTE — ED Provider Notes (Signed)
CSN: [id number]     Arrival date & time 09/01/13  1055 History   First MD Initiated Contact with Patient 09/01/13 1104     Chief Complaint  Patient presents with  . Shortness of Breath  . Chest Pain    HPI Pt was seen at 1115.  Per pt, c/o gradual onset and worsening of persistent cough, wheezing and SOB for the past 2 to 3 days.  Describes her symptoms as "my COPD is acting up."  Has been using her home O2 5L, home MDI and nebs without relief. States she continues to smoke cigarettes, "but I unplug my oxygen first."  Denies CP/palpitations, no back pain, no abd pain, no N/V/D, no fevers, no rash.     Past Medical History  Diagnosis Date  . DJD (degenerative joint disease)   . COPD (chronic obstructive pulmonary disease)   . Tobacco dependence 2000    low back  . Avascular necrosis   . Osteoporosis   . Substance abuse   . Neurofibromatosis   . Hyperplastic colonic polyp   . CHF (congestive heart failure)   . Abnormal weight loss   . Essential hypertension, benign   . Depressive disorder, not elsewhere classified   . Constipation   . Low back pain   . Anxiety   . Lung cancer 2012    LUL  . Shortness of breath   . Traumatic pneumothorax     history of  . History of radiation therapy 02/06/11 -02/17/11    LUL  . Chronic pain   . On home O2     5L N/C chronic  . Chronic headache    Past Surgical History  Procedure Laterality Date  . Laminectomy  02/2003  . Back surgery      multiple  . Tubal ligation    . Vesicovaginal fistula closure w/ tah    . Cholecystectomy    . Tonsilectomy, adenoidectomy, bilateral myringotomy and tubes    . Hip surgery      right hip, metal plate   Family History  Problem Relation Age of Onset  . Lung cancer Father   . Emphysema Mother   . Neurofibromatosis Mother   . Liver disease     History  Substance Use Topics  . Smoking status: Current Every Day Smoker -- 0.50 packs/day for 35 years    Types: Cigarettes  . Smokeless tobacco:  Never Used  . Alcohol Use: No    Review of Systems ROS: Statement: All systems negative except as marked or noted in the HPI; Constitutional: Negative for fever and chills. ; ; Eyes: Negative for eye pain, redness and discharge. ; ; ENMT: Negative for ear pain, hoarseness, nasal congestion, sinus pressure and sore throat. ; ; Cardiovascular: Negative for chest pain, palpitations, diaphoresis and peripheral edema. ; ; Respiratory: +cough, wheezing, SOB. Negative for stridor. ; ; Gastrointestinal: Negative for nausea, vomiting, diarrhea, abdominal pain, blood in stool, hematemesis, jaundice and rectal bleeding. . ; ; Genitourinary: Negative for dysuria, flank pain and hematuria. ; ; Musculoskeletal: Negative for back pain and neck pain. Negative for swelling and trauma.; ; Skin: Negative for pruritus, rash, abrasions, blisters, bruising and skin lesion.; ; Neuro: Negative for headache, lightheadedness and neck stiffness. Negative for weakness, altered level of consciousness , altered mental status, extremity weakness, paresthesias, involuntary movement, seizure and syncope.     Allergies  Penicillins  Home Medications   Current Outpatient Rx  Name  Route  Sig  Dispense  Refill  .  albuterol (PROVENTIL) (5 MG/ML) 0.5% nebulizer solution   Nebulization   Take 0.5 mLs (2.5 mg total) by nebulization every 4 (four) hours as needed for wheezing or shortness of breath.   20 mL   12   . ALPRAZolam (XANAX) 0.5 MG tablet   Oral   Take 0.5 mg by mouth every 6 (six) hours as needed for anxiety.         Marland Kitchen aspirin-acetaminophen-caffeine (EXCEDRIN MIGRAINE) 250-250-65 MG per tablet   Oral   Take 2 tablets by mouth every 6 (six) hours as needed for pain.         Marland Kitchen atorvastatin (LIPITOR) 10 MG tablet   Oral   Take 1 tablet (10 mg total) by mouth at bedtime.   30 tablet   0   . Fluticasone-Salmeterol (ADVAIR DISKUS) 250-50 MCG/DOSE AEPB   Inhalation   Inhale 1 puff into the lungs 2 (two) times  daily.   60 each   1   . lisinopril (PRINIVIL,ZESTRIL) 40 MG tablet   Oral   Take 1 tablet (40 mg total) by mouth every evening.   30 tablet   5   . mirtazapine (REMERON) 15 MG tablet   Oral   Take 1 tablet (15 mg total) by mouth at bedtime. Take one tablet by mouth at bedtime for rest   30 tablet   0   . oxyCODONE-acetaminophen (PERCOCET/ROXICET) 5-325 MG per tablet   Oral   Take 2 tablets by mouth every 6 (six) hours as needed for severe pain.   240 tablet   0   . QUEtiapine (SEROQUEL) 200 MG tablet   Oral   Take 400 mg by mouth at bedtime.          BP 133/83  Pulse 101  Temp(Src) 98.2 F (36.8 C) (Oral)  Resp 26  SpO2 95% Physical Exam 1120: Physical examination:  Nursing notes reviewed; Vital signs and O2 SAT reviewed;  Constitutional: Well developed, Well nourished, Uncomfortable appearing.; Head:  Normocephalic, atraumatic; Eyes: EOMI, PERRL, No scleral icterus; ENMT: Mouth and pharynx normal, Mucous membranes dry; Neck: Supple, Full range of motion, No lymphadenopathy; Cardiovascular: Tachycardic rate and rhythm, No gallop; Respiratory: Breath sounds diminished & equal bilaterally, scattered faint wheezes. Sitting upright, +access mm use, +tachpneic. Speaking short sentences.; Chest: Nontender, Movement normal; Abdomen: Soft, Nontender, Nondistended, Normal bowel sounds; Genitourinary: No CVA tenderness; Extremities: Pulses normal, No tenderness, No edema, No calf edema or asymmetry.; Neuro: AA&Ox3, Major CN grossly intact.  Speech clear. No gross focal motor or sensory deficits in extremities.; Skin: Color normal, Warm, Dry.   ED Course  Procedures    EKG Interpretation    Date/Time:  Monday September 01 2013 11:00:40 EST Ventricular Rate:  104 PR Interval:  123 QRS Duration: 81 QT Interval:  348 QTC Calculation: 458 R Axis:   77 Text Interpretation:  Sinus tachycardia Atrial premature complex Abnormal R-wave progression, early transition When compared with  ECG of 07/19/2013 No significant change was found Confirmed by [facility]  MD, Nunzio Cory [phone]) on 09/01/2013 11:36:36 AM            MDM  MDM Reviewed: previous chart, nursing note and vitals Reviewed previous: labs and ECG Interpretation: labs, ECG and x-ray Total time providing critical care: 30-74 minutes. This excludes time spent performing separately reportable procedures and services. Consults: admitting MD     CRITICAL CARE Performed by: Alfonzo Feller Total critical care time: 45 Critical care time was exclusive of separately billable procedures and  treating other patients. Critical care was necessary to treat or prevent imminent or life-threatening deterioration. Critical care was time spent personally by me on the following activities: development of treatment plan with patient and/or surrogate as well as nursing, discussions with consultants, evaluation of patient's response to treatment, examination of patient, obtaining history from patient or surrogate, ordering and performing treatments and interventions, ordering and review of laboratory studies, ordering and review of radiographic studies, pulse oximetry and re-evaluation of patient's condition.  Results for orders placed during the hospital encounter of 09/01/13  URINALYSIS W MICROSCOPIC + REFLEX CULTURE      Result Value Range   Color, Urine YELLOW  YELLOW   APPearance CLOUDY (*) CLEAR   Specific Gravity, Urine 1.033 (*) 1.005 - 1.030   pH 5.0  5.0 - 8.0   Glucose, UA NEGATIVE  NEGATIVE mg/dL   Hgb urine dipstick NEGATIVE  NEGATIVE   Bilirubin Urine SMALL (*) NEGATIVE   Ketones, ur NEGATIVE  NEGATIVE mg/dL   Protein, ur 30 (*) NEGATIVE mg/dL   Urobilinogen, UA 0.2  0.0 - 1.0 mg/dL   Nitrite NEGATIVE  NEGATIVE   Leukocytes, UA NEGATIVE  NEGATIVE   WBC, UA 0-2  <3 WBC/hpf   Bacteria, UA FEW (*) RARE   Squamous Epithelial / LPF FEW (*) RARE   Casts HYALINE CASTS (*) NEGATIVE   Crystals CA OXALATE CRYSTALS  (*) NEGATIVE   Urine-Other AMORPHOUS URATES/PHOSPHATES    BASIC METABOLIC PANEL      Result Value Range   Sodium 145  137 - 147 mEq/L   Potassium 4.5  3.7 - 5.3 mEq/L   Chloride 108  96 - 112 mEq/L   CO2 21  19 - 32 mEq/L   Glucose, Bld 103 (*) 70 - 99 mg/dL   BUN 8  6 - 23 mg/dL   Creatinine, Ser 0.64  0.50 - 1.10 mg/dL   Calcium 8.6  8.4 - 10.5 mg/dL   GFR calc non Af Amer >90  >90 mL/min   GFR calc Af Amer >90  >90 mL/min  CBC WITH DIFFERENTIAL      Result Value Range   WBC 8.4  4.0 - 10.5 K/uL   RBC 3.42 (*) 3.87 - 5.11 MIL/uL   Hemoglobin 11.3 (*) 12.0 - 15.0 g/dL   HCT 33.1 (*) 36.0 - 46.0 %   MCV 96.8  78.0 - 100.0 fL   MCH 33.0  26.0 - 34.0 pg   MCHC 34.1  30.0 - 36.0 g/dL   RDW 13.8  11.5 - 15.5 %   Platelets 182  150 - 400 K/uL   Neutrophils Relative % 82 (*) 43 - 77 %   Neutro Abs 6.8  1.7 - 7.7 K/uL   Lymphocytes Relative 11 (*) 12 - 46 %   Lymphs Abs 0.9  0.7 - 4.0 K/uL   Monocytes Relative 7  3 - 12 %   Monocytes Absolute 0.5  0.1 - 1.0 K/uL   Eosinophils Relative 1  0 - 5 %   Eosinophils Absolute 0.0  0.0 - 0.7 K/uL   Basophils Relative 0  0 - 1 %   Basophils Absolute 0.0  0.0 - 0.1 K/uL  TROPONIN I      Result Value Range   Troponin I <0.30  <0.30 ng/mL  PRO B NATRIURETIC PEPTIDE      Result Value Range   Pro B Natriuretic peptide (BNP) 398.9 (*) 0 - 125 pg/mL  CG4 I-STAT (LACTIC ACID)  Result Value Range   Lactic Acid, Venous 1.87  0.5 - 2.2 mmol/L   Dg Chest Port 1 View 09/01/2013   CLINICAL DATA:  SHORTNESS OF BREATH CHEST PAIN  EXAM: PORTABLE CHEST - 1 VIEW  COMPARISON:  DG CHEST 2 VIEW dated 05/23/2013; DG CHEST 2 VIEW dated 07/19/2013  FINDINGS: There is bilateral diffuse chronic interstitial thickening. There is no focal parenchymal opacity, pleural effusion, or pneumothorax. The heart and mediastinal contours are unremarkable. The osseous structures are unremarkable.  IMPRESSION: Bilateral diffuse chronic interstitial thickening. Mild  superimposed atypical infection or edema cannot be excluded.   Electronically Signed   By: Kathreen Devoid   On: 09/01/2013 12:04    1125:  Pt tachypneic, insp/exp wheezing bilat on arrival. Will dose IV solumedrol and start hour long neb while workup progresses.  Pt also requesting "something for my nerves and my pains." Pt states she missed her usual morning dose of xanax and percocet and is requesting both now. Will dose both PO per pt request.   8676:  Hour long neb completed. Lungs continue with faint insp/exp wheezing, tachpneic, sitting upright. At rest, Sats 95% on pt's usual O2 N/C 5L. Pt ambulated to bedside commode with Sats dropping to 88% despite wearing her usual O2 N/C 5L, and pt c/o increasing SOB and RR. Dx and testing d/w pt.  Questions answered.  Verb understanding, agreeable to admit.  1340:  T/C to [facility] Poli, case discussed, including:  HPI, pertinent PM/SHx, VS/PE, dx testing, ED course and treatment:  Agreeable to admit, requests to write temporary orders, obtain inpatient tele bed to team 10.   Alfonzo Feller, DO 09/03/13 1523

## 2013-09-01 NOTE — ED Notes (Signed)
Pt woke up with sob, chest pain and headache.  HX of COPD.  She gave herself 2 breathing tx (0630 and 1000) with a cigarette in between tx.  PT denies relief from breathing tx.  She called her daughter who called 911.  Sats of 94 on 5L (pt is always on 5 L).

## 2013-09-01 NOTE — ED Notes (Signed)
Pt requesting a meal; meal tray ordered per Dr.McManus

## 2013-09-01 NOTE — ED Notes (Signed)
NOTIFIED DR, [facility] OF PATIENTS LAB RESULTS OF CG4+ LACTIC ACID ,09/01/2013.

## 2013-09-01 NOTE — ED Notes (Signed)
Respiratory paged

## 2013-09-02 DIAGNOSIS — I509 Heart failure, unspecified: Secondary | ICD-10-CM

## 2013-09-02 LAB — BASIC METABOLIC PANEL
BUN: 16 mg/dL (ref 6–23)
CO2: 25 meq/L (ref 19–32)
Calcium: 8.2 mg/dL — ABNORMAL LOW (ref 8.4–10.5)
Chloride: 102 mEq/L (ref 96–112)
Creatinine, Ser: 0.77 mg/dL (ref 0.50–1.10)
GFR calc Af Amer: >90 mL/min (ref >90)
GFR calc non Af Amer: 87 mL/min — ABNORMAL LOW (ref >90)
GLUCOSE: 127 mg/dL — AB (ref 70–99)
POTASSIUM: 4.2 meq/L (ref 3.7–5.3)
SODIUM: 141 meq/L (ref 137–147)

## 2013-09-02 LAB — CBC
HCT: 30.8 % — ABNORMAL LOW (ref 36.0–46.0)
HEMOGLOBIN: 10.5 g/dL — AB (ref 12.0–15.0)
MCH: 32.4 pg (ref 26.0–34.0)
MCHC: 34.1 g/dL (ref 30.0–36.0)
MCV: 95.1 fL (ref 78.0–100.0)
Platelets: 191 10*3/uL (ref 150–400)
RBC: 3.24 MIL/uL — AB (ref 3.87–5.11)
RDW: 13.7 % (ref 11.5–15.5)
WBC: 12.8 10*3/uL — ABNORMAL HIGH (ref 4.0–10.5)

## 2013-09-02 LAB — INFLUENZA PANEL BY PCR (TYPE A & B)
H1N1FLUPCR: NOT DETECTED
Influenza A By PCR: NEGATIVE
Influenza B By PCR: NEGATIVE

## 2013-09-02 LAB — PRO B NATRIURETIC PEPTIDE: Pro B Natriuretic peptide (BNP): 1453 pg/mL — ABNORMAL HIGH (ref 0–125)

## 2013-09-02 MED ORDER — IPRATROPIUM-ALBUTEROL 0.5-2.5 (3) MG/3ML IN SOLN
3.0000 mL | Freq: Four times a day (QID) | RESPIRATORY_TRACT | Status: DC
  Administered 2013-09-03 (×2): 3 mL via RESPIRATORY_TRACT
  Filled 2013-09-02 (×2): qty 3

## 2013-09-02 MED ORDER — ALBUTEROL SULFATE (2.5 MG/3ML) 0.083% IN NEBU: 2.5000 mg | INHALATION_SOLUTION | RESPIRATORY_TRACT | Status: DC | PRN

## 2013-09-02 MED ORDER — FUROSEMIDE 10 MG/ML IJ SOLN
20.0000 mg | Freq: Once | INTRAMUSCULAR | Status: AC
  Administered 2013-09-02: 20 mg via INTRAVENOUS
  Filled 2013-09-02: qty 2

## 2013-09-02 MED ORDER — ALBUTEROL SULFATE (2.5 MG/3ML) 0.083% IN NEBU
2.5000 mg | INHALATION_SOLUTION | RESPIRATORY_TRACT | Status: DC
  Administered 2013-09-02 (×2): 2.5 mg via RESPIRATORY_TRACT
  Filled 2013-09-02 (×2): qty 3

## 2013-09-02 NOTE — Progress Notes (Signed)
UR Completed Taela Charbonneau Graves-Bigelow, RN,BSN [phone]  

## 2013-09-02 NOTE — Progress Notes (Signed)
[facility] PROGRESS NOTE  [place] XNA:[id number] DOB: 09/12/1950 DOA: 09/01/2013 PCP: Hollace Kinnier, DO  Assessment/Plan: -Acute on chronic Respiratory Failure;   Component of diastolic Heart Failure exacerbation. Patient with bilateral crackles on lung exam. Chest x ray with Mild superimposed  atypical infection or edema cannot be excluded. BNP at 1453. Will give another dose of IV lasix. Continue with treatment for COPD exacerbation.   -COPD with acute exacerbation:  -Pt has hx of COPD with multiple exacerbations  -Influenza panel negative.  -Continue with Levaquin 500 mg Qday and IV Solumedrol.  -Will continue home regimen of 5L of O2  - Albuterol Nebs, Anti-Tussives, and Mucinex    Pluertic Chest Pain:  -Likely secondary to recent abscess that per patient hx was I&D from L axilla in last few months  -EKG does not demonstrates any acute changes   Anxiety:  -Continue home regimen of Xanax 0.5 mg prn Q6  Tobacco Dependence:  -Counseled patient on the necessity to quit.  HTN:  -Currently stable; 121/54  -hold lisinopril SBP soft (112).   Headache, Chronic:  -Continue Percocet home regimen    Code Status: Partial CODE, no intubation.  Family Communication: Care discussed with patient.  Disposition Plan: remain inpatient.    Consultants:  none  Procedures:  none  Antibiotics:  Levaquin   HPI/Subjective: Still with SOB. Not at baseline.  She does not want to be intubated. No CPR, ok medications.   Objective: Filed Vitals:   09/02/13 0601  BP: 113/65  Pulse: 90  Temp: 98 F (36.7 C)  Resp: 19   No intake or output data in the 24 hours ending 09/02/13 0947 Filed Weights   09/02/13 0903  Weight: 55.792 kg (123 lb)    Exam:   General:  No distress.   Cardiovascular: S 1, S 2 RRR  Respiratory: Bilateral fine crackles, no significant wheezes.   Abdomen: BS present, soft, NT  Musculoskeletal: no edema.   Data Reviewed: Basic  Metabolic Panel:  Recent Labs Lab 09/01/13 1118 09/02/13 0510  NA 145 141  K 4.5 4.2  CL 108 102  CO2 21 25  GLUCOSE 103* 127*  BUN 8 16  CREATININE 0.64 0.77  CALCIUM 8.6 8.2*   Liver Function Tests: No results found for this basename: AST, ALT, ALKPHOS, BILITOT, PROT, ALBUMIN,  in the last 168 hours No results found for this basename: LIPASE, AMYLASE,  in the last 168 hours No results found for this basename: AMMONIA,  in the last 168 hours CBC:  Recent Labs Lab 09/01/13 1118 09/02/13 0510  WBC 8.4 12.8*  NEUTROABS 6.8  --   HGB 11.3* 10.5*  HCT 33.1* 30.8*  MCV 96.8 95.1  PLT 182 191   Cardiac Enzymes:  Recent Labs Lab 09/01/13 1118  TROPONINI <0.30   BNP (last 3 results)  Recent Labs  05/23/13 1320 07/19/13 2239 09/01/13 1118  PROBNP 635.7* 94.7 398.9*   CBG: No results found for this basename: GLUCAP,  in the last 168 hours  No results found for this or any previous visit (from the past 240 hour(s)).   Studies: Dg Chest Port 1 View  09/01/2013   CLINICAL DATA:  SHORTNESS OF BREATH CHEST PAIN  EXAM: PORTABLE CHEST - 1 VIEW  COMPARISON:  DG CHEST 2 VIEW dated 05/23/2013; DG CHEST 2 VIEW dated 07/19/2013  FINDINGS: There is bilateral diffuse chronic interstitial thickening. There is no focal parenchymal opacity, pleural effusion, or pneumothorax. The heart and mediastinal contours are  unremarkable. The osseous structures are unremarkable.  IMPRESSION: Bilateral diffuse chronic interstitial thickening. Mild superimposed atypical infection or edema cannot be excluded.   Electronically Signed   By: Kathreen Devoid   On: 09/01/2013 12:04    Scheduled Meds: . atorvastatin  10 mg Oral QHS  . furosemide  20 mg Intravenous Once  . guaiFENesin  1,200 mg Oral BID  . heparin  5,000 Units Subcutaneous Q8H  . levofloxacin (LEVAQUIN) IV  500 mg Intravenous Q24H  . methylPREDNISolone (SOLU-MEDROL) injection  40 mg Intravenous Q12H  . mirtazapine  15 mg Oral QHS  .  mometasone-formoterol  2 puff Inhalation BID  . QUEtiapine  400 mg Oral QHS   Continuous Infusions:   Principal Problem:   COPD with acute exacerbation Active Problems:   ANXIETY   TOBACCO DEPENDENCE   HYPERTENSION, BENIGN SYSTEMIC   HEADACHE, CHRONIC   Pleuritic chest pain    Time spent: 25 minutes.     [name]  [facility] Pager [phone]. If 7PM-7AM, please contact night-coverage at [email or web address], password [email or web address] 09/02/2013, 9:47 AM  LOS: 1 day

## 2013-09-02 NOTE — Care Management Note (Unsigned)
    Page 1 of 1   09/02/2013     12:42:31 PM   CARE MANAGEMENT NOTE 09/02/2013  Patient:  [name], [name]   Account Number:  [id number]  Date Initiated:  09/02/2013  Documentation initiated by:  GRAVES-BIGELOW,Karrington Mccravy  Subjective/Objective Assessment:   Pt admitted for COPD exacerbation. Pt is on 02 5L at home.     Action/Plan:   CM will continue to monitor for disposition needs.   Anticipated DC Date:  09/04/2013   Anticipated DC Plan:  [place]  CM consult      Choice offered to / List presented to:             Status of service:  In process, will continue to follow Medicare Important Message given?   (If response is "NO", the following Medicare IM given date fields will be blank) Date Medicare IM given:   Date Additional Medicare IM given:    Discharge Disposition:    Per UR Regulation:  Reviewed for med. necessity/level of care/duration of stay  If discussed at [place] of Stay Meetings, dates discussed:    Comments:

## 2013-09-03 DIAGNOSIS — C3490 Malignant neoplasm of unspecified part of unspecified bronchus or lung: Secondary | ICD-10-CM

## 2013-09-03 LAB — BASIC METABOLIC PANEL
BUN: 27 mg/dL — AB (ref 6–23)
CHLORIDE: 105 meq/L (ref 96–112)
CO2: 24 meq/L (ref 19–32)
CREATININE: 0.95 mg/dL (ref 0.50–1.10)
Calcium: 8.6 mg/dL (ref 8.4–10.5)
GFR calc Af Amer: 72 mL/min — ABNORMAL LOW (ref >90)
GFR calc non Af Amer: 62 mL/min — ABNORMAL LOW (ref >90)
Glucose, Bld: 115 mg/dL — ABNORMAL HIGH (ref 70–99)
Potassium: 5.1 mEq/L (ref 3.7–5.3)
Sodium: 145 mEq/L (ref 137–147)

## 2013-09-03 LAB — CBC
HCT: 33.6 % — ABNORMAL LOW (ref 36.0–46.0)
Hemoglobin: 11.2 g/dL — ABNORMAL LOW (ref 12.0–15.0)
MCH: 32.5 pg (ref 26.0–34.0)
MCHC: 33.3 g/dL (ref 30.0–36.0)
MCV: 97.4 fL (ref 78.0–100.0)
Platelets: 191 10*3/uL (ref 150–400)
RBC: 3.45 MIL/uL — ABNORMAL LOW (ref 3.87–5.11)
RDW: 14 % (ref 11.5–15.5)
WBC: 16.8 10*3/uL — ABNORMAL HIGH (ref 4.0–10.5)

## 2013-09-03 MED ORDER — GUAIFENESIN ER 600 MG PO TB12: 1200.0000 mg | ORAL_TABLET | Freq: Two times a day (BID) | ORAL | Status: AC

## 2013-09-03 MED ORDER — LEVOFLOXACIN 750 MG PO TABS: 750.0000 mg | ORAL_TABLET | Freq: Every day | ORAL | Status: DC

## 2013-09-03 MED ORDER — PREDNISONE 10 MG PO TABS: 10.0000 mg | ORAL_TABLET | Freq: Every day | ORAL | Status: DC

## 2013-09-03 NOTE — Discharge Summary (Signed)
Physician Discharge Summary  [name] IRC:[id number] DOB: April 01, 1951 DOA: 09/01/2013  PCP: Hollace Kinnier, DO  Admit date: 09/01/2013 Discharge date: 09/03/2013  Time spent: 45 minutes  Recommendations for Outpatient Follow-up:  -Will be discharged home today. -Advised to follow up with her PCP in 1 week. -Will temporarily increase her baseline oxygen from 5 to 6 liters as she becomes hypoxic while ambulating on 5 L.   Discharge Diagnoses:  Principal Problem:   COPD with acute exacerbation Active Problems:   ANXIETY   TOBACCO DEPENDENCE   HYPERTENSION, BENIGN SYSTEMIC   HEADACHE, CHRONIC   Acute-on-chronic respiratory failure   Pleuritic chest pain   Discharge Condition: Stable and improved  Filed Weights   09/02/13 0903  Weight: 55.792 kg (123 lb)    History of present illness:  [name] is a 63 y.o. 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 She presents today to the ED with complaints of SOB and wheezing and CP and HA. Per patient: SOB and wheezing began at 5:30 this morning and woke her from sleep. She tried 2 breathing treatments at home; with no relief. Pt has a productive cough that is producing yellow-green sputum, which is normal for her, but the amount has increased. Pt smokes 10 cigarettes a day. Pt states CP feels like pounding, is a 7 out of 10, and is located on her left side and radiates to her axilla (in same area as recent abscess that was I&D). HA begins at crown and goes to neck; patient rates it a 7 out of 10 on pain scale. Pt was given 1 hour long Albuterol Neb and IV Solumedrol and was going to be d/c to home but became hypoxic walking to bedside commode. Hospitalist admission was requested.   Hospital Course:   Acute on Chronic Hypoxemic Respiratory Failure -2/2 COPD with acute exacerbation and  possibly a mild component of acute diastolic CHF. -See below for details.  COPD with Acute Exacerbation -Clinically improved. -Suspect she needs to have an increased oxygen flow at home. (Increase to 6L). -Will DC on 5 days of empiric levaquin and a prednisone taper.  Acute Diastolic CHF -Currently volume status appears stable.  Tobacco Abuse -Continues to smoke. -Counseled extensively on cessation.  Procedures:  None   Consultations:  None  Discharge Instructions  Discharge Orders   Future Appointments Provider Department Dept Phone   11/11/2013 8:15 AM [facility] [place] [phone]   11/13/2013 11:00 AM Gayland Curry, [place] [phone]   12/01/2013 10:00 AM [facility] [phone]   Liquids only 4 hours prior to your exam. Any medications can be taken as usual. Please arrive 15 min prior to your scheduled exam time.   12/04/2013 1:00 PM Thea Silversmith, MD [facility] [phone]   Future Orders Complete By Expires   Discontinue IV  As directed    Increase activity slowly  As directed        Medication List         albuterol (5 MG/ML) 0.5% nebulizer solution  Commonly known as:  PROVENTIL  Take 0.5 mLs (2.5 mg total) by nebulization every 4 (four) hours as needed for wheezing or shortness of breath.     ALPRAZolam 0.5 MG tablet  Commonly known as:  Duanne Moron  Take 0.5 mg by mouth every 6 (six) hours as needed for anxiety.     aspirin-acetaminophen-caffeine 017-494-49 MG per tablet  Commonly known as:  EXCEDRIN MIGRAINE  Take 2 tablets by mouth every 6 (six) hours as needed for pain.     atorvastatin 10 MG tablet  Commonly known as:  LIPITOR  Take 1 tablet (10 mg total) by mouth at bedtime.     Fluticasone-Salmeterol 250-50 MCG/DOSE Aepb  Commonly known as:  ADVAIR DISKUS  Inhale 1 puff into the lungs 2 (two) times daily.     guaiFENesin 600 MG 12 hr tablet   Commonly known as:  MUCINEX  Take 2 tablets (1,200 mg total) by mouth 2 (two) times daily.     levofloxacin 750 MG tablet  Commonly known as:  LEVAQUIN  Take 1 tablet (750 mg total) by mouth daily. For 5 days     lisinopril 40 MG tablet  Commonly known as:  PRINIVIL,ZESTRIL  Take 1 tablet (40 mg total) by mouth every evening.     mirtazapine 15 MG tablet  Commonly known as:  REMERON  Take 1 tablet (15 mg total) by mouth at bedtime. Take one tablet by mouth at bedtime for rest     oxyCODONE-acetaminophen 5-325 MG per tablet  Commonly known as:  PERCOCET/ROXICET  Take 2 tablets by mouth every 6 (six) hours as needed for severe pain.     predniSONE 10 MG tablet  Commonly known as:  DELTASONE  Take 1 tablet (10 mg total) by mouth daily with breakfast. Take 6 tablets today and decrease by 1 tablet daily until none are left.     QUEtiapine 200 MG tablet  Commonly known as:  SEROQUEL  Take 400 mg by mouth at bedtime.       Allergies  Allergen Reactions  . Penicillins Rash       Follow-up Information   Follow up with REED, TIFFANY, DO. Schedule an appointment as soon as possible for a visit in 1 week.   Specialty:  Geriatric Medicine   Contact information:   [address] [place] [address] [phone]        The results of significant diagnostics from this hospitalization (including imaging, microbiology, ancillary and laboratory) are listed below for reference.    Significant Diagnostic Studies: Dg Chest Port 1 View  09/01/2013   CLINICAL DATA:  SHORTNESS OF BREATH CHEST PAIN  EXAM: PORTABLE CHEST - 1 VIEW  COMPARISON:  DG CHEST 2 VIEW dated 05/23/2013; DG CHEST 2 VIEW dated 07/19/2013  FINDINGS: There is bilateral diffuse chronic interstitial thickening. There is no focal parenchymal opacity, pleural effusion, or pneumothorax. The heart and mediastinal contours are unremarkable. The osseous structures are unremarkable.  IMPRESSION: Bilateral diffuse chronic  interstitial thickening. Mild superimposed atypical infection or edema cannot be excluded.   Electronically Signed   By: Kathreen Devoid   On: 09/01/2013 12:04    Microbiology: No results found for this or any previous visit (from the past 240 hour(s)).   Labs: Basic Metabolic Panel:  Recent Labs Lab 09/01/13 1118 09/02/13 0510 09/03/13 0423  NA 145 141 145  K 4.5 4.2 5.1  CL 108 102 105  CO2 21 25 24   GLUCOSE 103* 127* 115*  BUN 8 16 27*  CREATININE 0.64 0.77 0.95  CALCIUM 8.6 8.2* 8.6   Liver Function Tests: No results found for this basename: AST, ALT, ALKPHOS, BILITOT, PROT, ALBUMIN,  in the last 168 hours No results found for  this basename: LIPASE, AMYLASE,  in the last 168 hours No results found for this basename: AMMONIA,  in the last 168 hours CBC:  Recent Labs Lab 09/01/13 1118 09/02/13 0510 09/03/13 0423  WBC 8.4 12.8* 16.8*  NEUTROABS 6.8  --   --   HGB 11.3* 10.5* 11.2*  HCT 33.1* 30.8* 33.6*  MCV 96.8 95.1 97.4  PLT 182 191 191   Cardiac Enzymes:  Recent Labs Lab 09/01/13 1118  TROPONINI <0.30   BNP: BNP (last 3 results)  Recent Labs  07/19/13 2239 09/01/13 1118 09/02/13 0510  PROBNP 94.7 398.9* 1453.0*   CBG: No results found for this basename: GLUCAP,  in the last 168 hours     Signed:  Lelon Frohlich  [facility] Pager: [phone] 09/03/2013, 10:33 AM

## 2013-09-03 NOTE — Progress Notes (Signed)
Pt provided with dc instructions and education. Pt verbalized understanding. Pt has no questinos at this time. IV removed with tip intact. Heart monitor cleaned and returned to front. Dorna Bloom, RN

## 2013-09-03 NOTE — Progress Notes (Signed)
Pt calling out from room to hallway stating " i can't breath". Pt sitting on side of bed eating lunch without distress or difficulty. O2 sats 93% on 5L. Dr Alden Hipp made aware. OK to go ahead with discharge. Dorna Bloom, RN

## 2013-09-03 NOTE — Progress Notes (Addendum)
Pt ambulated in hallway. Pt O2 sats dropped to 79% on 6L o2. Pt placed on 6L and sats increased to 92% after 5 minutes of rest. HR elevated to 120s (Sinus tach). Pt complained of SOB but no wheezing on exam. Breath sounds dimished bilaterally. Pt back in bed on 5L of 02 with sats 90-92%. Dr Alden Hipp made aware. Dorna Bloom, RN.

## 2013-09-11 ENCOUNTER — Encounter: Payer: Self-pay | Admitting: Nurse Practitioner

## 2013-09-11 ENCOUNTER — Ambulatory Visit: Payer: Medicare Other | Admitting: Nurse Practitioner

## 2013-09-11 ENCOUNTER — Ambulatory Visit (INDEPENDENT_AMBULATORY_CARE_PROVIDER_SITE_OTHER): Payer: Medicare Other | Admitting: Nurse Practitioner

## 2013-09-11 VITALS — BP 110/70 | HR 100 | Resp 22 | Wt 130.0 lb

## 2013-09-11 DIAGNOSIS — F17200 Nicotine dependence, unspecified, uncomplicated: Secondary | ICD-10-CM

## 2013-09-11 DIAGNOSIS — J441 Chronic obstructive pulmonary disease with (acute) exacerbation: Secondary | ICD-10-CM

## 2013-09-11 DIAGNOSIS — F411 Generalized anxiety disorder: Secondary | ICD-10-CM

## 2013-09-11 MED ORDER — LEVOFLOXACIN 750 MG PO TABS
750.0000 mg | ORAL_TABLET | Freq: Every day | ORAL | Status: DC
Start: 2013-09-11 — End: 2013-12-23

## 2013-09-11 MED ORDER — IPRATROPIUM-ALBUTEROL 0.5-2.5 (3) MG/3ML IN SOLN: 3.0000 mL | RESPIRATORY_TRACT | Status: DC | PRN

## 2013-09-11 MED ORDER — PREDNISONE 20 MG PO TABS: ORAL_TABLET | ORAL | Status: AC

## 2013-09-11 MED ORDER — DEPEND UNDERGARMENTS MISC: Status: DC

## 2013-09-11 MED ORDER — ALPRAZOLAM 0.5 MG PO TABS: ORAL_TABLET | ORAL | Status: AC

## 2013-09-11 MED ORDER — OXYCODONE-ACETAMINOPHEN 5-325 MG PO TABS: 2.0000 | ORAL_TABLET | Freq: Four times a day (QID) | ORAL | Status: AC | PRN

## 2013-09-11 NOTE — Patient Instructions (Signed)
Will give you another round of prednisone and Levaquin (antibiotic) -will give prescription for DUONEBS-- use this instead of albuterol  -mucinex DM 1 tablet with full glass of water twice daily -will consult palliative care for goals of care  ---make appt with pulmonary doctor-- follow up with that off or our office ([facility] in 7-10 days)   If breathing does not improve or gets worse while you on these medication you seen to seek medical attention immediately

## 2013-09-11 NOTE — Progress Notes (Signed)
Patient ID: [name], female   DOB: 13-Sep-1950, 63 y.o.   MRN: [id number]    Allergies  Allergen Reactions  . Penicillins Rash    Chief Complaint  Patient presents with  . Hospitalization Follow-up    Patient was hospitalized x 3 days due to SOB, patient currently at 6 liters of oxygen   . Shaking    Patient is trying to stop smoking, currently at less than 1/2 a pack. Since trying to quit patient very shakey   . Orders    Patient would like an order for depends- advance homecare    HPI: Patient is a 63 y.o. 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 Now back at home-- conts on 6L [place], finished prednisone and  Levaquin; reports breathing is still rough Taking advair and albuterol neb  -Continues to smoke but has cut back to less than 1/2 pack a day from 2 pack -- smokes a few puffs at a time because she has to take her oxygen off to smoke  -needs to follow up with pulmonary doctor (she reports she is overdue)  -taking xanax regularly 1 every 5-6 hours needs it to help her quit smoking   Review of Systems:  Review of Systems  Constitutional: Positive for malaise/fatigue. Negative for fever, chills and weight loss.  HENT: Negative for congestion, ear discharge and hearing loss.   Eyes: Negative for blurred vision.  Respiratory: Positive for cough, sputum production, shortness of breath and wheezing.        Chronic; shortness of breath is worse from when she left the hospital  Cardiovascular: Positive for chest pain (still having chest pains from hospital).  Gastrointestinal: Negative for heartburn, abdominal pain, blood in stool and melena.  Genitourinary: Negative for dysuria.  Musculoskeletal: Negative for falls and myalgias.  Skin: Negative for rash.   neurofibromas  Neurological: Positive for headaches. Negative for dizziness, loss of consciousness and weakness.  Psychiatric/Behavioral: Negative for depression. The patient is nervous/anxious.      Past Medical History  Diagnosis Date  . DJD (degenerative joint disease)   . COPD (chronic obstructive pulmonary disease)   . Tobacco dependence 2000    low back  . Avascular necrosis   . Osteoporosis   . Substance abuse   . Neurofibromatosis   . Hyperplastic colonic polyp   . CHF (congestive heart failure)   . Abnormal weight loss   . Essential hypertension, benign   . Depressive disorder, not elsewhere classified   . Constipation   . Low back pain   . Anxiety   . Lung cancer 2012    LUL  . Shortness of breath   . Traumatic pneumothorax     history of  . History of radiation therapy 02/06/11 -02/17/11    LUL  . Chronic pain   . On home O2     5L N/C chronic  . Chronic headache    Past Surgical History  Procedure Laterality Date  . Laminectomy  02/2003  . Back surgery      multiple  . Tubal ligation    . Vesicovaginal fistula closure w/ tah    . Cholecystectomy    . Tonsilectomy, adenoidectomy, bilateral myringotomy and tubes    . Hip surgery  right hip, metal plate   Social History:   reports that she has been smoking Cigarettes.  She has a 17.5 pack-year smoking history. She has never used smokeless tobacco. She reports that she does not drink alcohol or use illicit drugs.  Family History  Problem Relation Age of Onset  . Lung cancer Father   . Emphysema Mother   . Neurofibromatosis Mother   . Liver disease      Medications: Patient's Medications  New Prescriptions   INCONTINENCE SUPPLY DISPOSABLE (DEPEND UNDERGARMENTS) MISC    Use depend daily as needed   IPRATROPIUM-ALBUTEROL (DUONEB) 0.5-2.5 (3) MG/3ML SOLN    Take 3 mLs by nebulization every 4 (four) hours as needed.   PREDNISONE (DELTASONE) 20 MG TABLET    Take 3 tablets for 5 days then decrease to 2  tablets for 2 days then decrease to 1 tablet for 2 days  Previous Medications   ALBUTEROL (PROVENTIL) (5 MG/ML) 0.5% NEBULIZER SOLUTION    Take 0.5 mLs (2.5 mg total) by nebulization every 4 (four) hours as needed for wheezing or shortness of breath.   ASPIRIN-ACETAMINOPHEN-CAFFEINE (EXCEDRIN MIGRAINE) 250-250-65 MG PER TABLET    Take 2 tablets by mouth every 6 (six) hours as needed for pain.   ATORVASTATIN (LIPITOR) 10 MG TABLET    Take 1 tablet (10 mg total) by mouth at bedtime.   FLUTICASONE-SALMETEROL (ADVAIR DISKUS) 250-50 MCG/DOSE AEPB    Inhale 1 puff into the lungs 2 (two) times daily.   GUAIFENESIN (MUCINEX) 600 MG 12 HR TABLET    Take 2 tablets (1,200 mg total) by mouth 2 (two) times daily.   LISINOPRIL (PRINIVIL,ZESTRIL) 40 MG TABLET    Take 1 tablet (40 mg total) by mouth every evening.   MIRTAZAPINE (REMERON) 15 MG TABLET    Take 1 tablet (15 mg total) by mouth at bedtime. Take one tablet by mouth at bedtime for rest   QUETIAPINE (SEROQUEL) 200 MG TABLET    Take 400 mg by mouth at bedtime.  Modified Medications   Modified Medication Previous Medication   ALPRAZOLAM (XANAX) 0.5 MG TABLET ALPRAZolam (XANAX) 0.5 MG tablet      1-2 tablet as needed for anxiety  May increase to 2 tablets for 1 week as you attempt to stop smoking    Take 0.5 mg by mouth every 6 (six) hours as needed for anxiety.   LEVOFLOXACIN (LEVAQUIN) 750 MG TABLET levofloxacin (LEVAQUIN) 750 MG tablet      Take 1 tablet (750 mg total) by mouth daily. For 5 days    Take 1 tablet (750 mg total) by mouth daily. For 5 days   OXYCODONE-ACETAMINOPHEN (PERCOCET/ROXICET) 5-325 MG PER TABLET oxyCODONE-acetaminophen (PERCOCET/ROXICET) 5-325 MG per tablet      Take 2 tablets by mouth every 6 (six) hours as needed for severe pain.    Take 2 tablets by mouth every 6 (six) hours as needed for severe pain.  Discontinued Medications   PREDNISONE (DELTASONE) 10 MG TABLET    Take 1 tablet (10 mg total) by mouth daily with breakfast.  Take 6 tablets today and decrease by 1 tablet daily until none are left.     Physical Exam:  Filed Vitals:   09/11/13 0839  BP: 110/70  Pulse: 100  Resp: 22  Weight: 130 lb (58.968 kg)  SpO2: 95%    Physical Exam  Constitutional: She is oriented to person, place, and time.  Frail female in NAD  HENT:  Mouth/Throat: Oropharynx is clear and  moist. No oropharyngeal exudate.  Cardiovascular: Normal rate, regular rhythm and normal heart sounds.   Pulmonary/Chest: No respiratory distress.  Increase RR; worse with talking, progressively worse for her; breath sounds diminished throughout  Abdominal: Soft. Bowel sounds are normal. She exhibits no distension.  Neurological: She is alert and oriented to person, place, and time.  Skin: Skin is warm and dry. No erythema.  Psychiatric: Affect normal.     Labs reviewed: Basic Metabolic Panel:  Recent Labs  01/28/13 0154  09/01/13 1118 09/02/13 0510 09/03/13 0423  NA  --   < > 145 141 145  K  --   < > 4.5 4.2 5.1  CL  --   < > 108 102 105  CO2  --   < > 21 25 24   GLUCOSE  --   < > 103* 127* 115*  BUN  --   < > 8 16 27*  CREATININE 0.80  < > 0.64 0.77 0.95  CALCIUM  --   < > 8.6 8.2* 8.6  TSH 0.280*  --   --   --   --   < > = values in this interval not displayed. Liver Function Tests: No results found for this basename: AST, ALT, ALKPHOS, BILITOT, PROT, ALBUMIN,  in the last 8760 hours No results found for this basename: LIPASE, AMYLASE,  in the last 8760 hours No results found for this basename: AMMONIA,  in the last 8760 hours CBC:  Recent Labs  01/27/13 1953  02/10/13 1532  09/01/13 1118 09/02/13 0510 09/03/13 0423  WBC 6.2  < > 11.2*  < > 8.4 12.8* 16.8*  NEUTROABS 4.1  --  6.0  --  6.8  --   --   HGB 12.0  < > 12.1  < > 11.3* 10.5* 11.2*  HCT 36.3  < > 36.4  < > 33.1* 30.8* 33.6*  MCV 91.4  < > 84  < > 96.8 95.1 97.4  PLT 239  < >  --   < > 182 191 191  < > = values in this interval not displayed. Lipid  Panel:  Recent Labs  12/26/12 1123  HDL 43  LDLCALC 131*  TRIG 199*  CHOLHDL 5.0*   TSH:  Recent Labs  01/28/13 0154  TSH 0.280*   A1C: No components found with this basename: A1C,    Assessment/Plan 1. COPD with acute exacerbation -mucinex DM BID for 1 week with increase water intake - predniSONE (DELTASONE) 20 MG tablet; Take 3 tablets for 5 days then decrease to 2 tablets for 2 days then decrease to 1 tablet for 2 days  Dispense: 21 tablet; Refill: 0 - levofloxacin (LEVAQUIN) 750 MG tablet; Take 1 tablet (750 mg total) by mouth daily. For 5 days  Dispense: 5 tablet; Refill: 0 - ipratropium-albuterol (DUONEB) 0.5-2.5 (3) MG/3ML SOLN; Take 3 mLs by nebulization every 4 (four) hours as needed.  Dispense: 360 mL; Refill: 2 -pt does not want to go back to the hospital but will if she has to; has made it clear she is a DNR and does not want to be intubated (understands this would not be good for her in her advanced disease state) Ambulatory referral to Hospice- due to end stage COPD for goals of care -pt instructed if breathing gets worse on this treatment to go to hospital immediately   2. ANXIETY - ALPRAZolam (XANAX) 0.5 MG tablet; 1-2 tablet as needed for anxiety  May increase to 2 tablets for  1 week as you attempt to stop smoking  Dispense: 160 tablet; Refill: 0  3. TOBACCO DEPENDENCE -to quit smoking; ongoing education provided -may cont xanax as needed for smoking cessation and advanced COPD    35 mins Time TOTAL:  time greater than 50% of total time spent doing pt counseled and coordination of care regarding worsening disease state, preparing for end of life care, worsening COPD and smoking cessation

## 2013-09-16 ENCOUNTER — Telehealth: Payer: Self-pay | Admitting: *Deleted

## 2013-09-16 NOTE — Telephone Encounter (Signed)
Mercie Eon with Hospice called and stated that they admitted patient today to their services and they stated that patient [name] need another Rx for Ativan. Patient told her that she stores the bottle under her mattress and the whole bottle of pills melted. The nurse stated that there was a weird looking orange cream in the bottle, not sure what it was. Please Advise.  Pharmacy: [facility]

## 2013-09-17 NOTE — Telephone Encounter (Signed)
FYI of no narcotics to be given to patient by our practice notated in patient's chart

## 2013-09-17 NOTE — Telephone Encounter (Signed)
Spoke with lynn- apparently there was confirmed diversion in the home; 150 pills that were filled on 09/11/13 that are now gone. At this time hospice will take over symptom management and at this time we will defer all symptom management to hospice MD-- therefore not providing ANY refills on ativan or pain management at this time

## 2013-09-18 ENCOUNTER — Ambulatory Visit: Payer: Medicare Other | Admitting: Nurse Practitioner

## 2013-09-22 ENCOUNTER — Other Ambulatory Visit: Payer: Self-pay | Admitting: Internal Medicine

## 2013-11-09 ENCOUNTER — Other Ambulatory Visit: Payer: Self-pay | Admitting: Nurse Practitioner

## 2013-11-10 ENCOUNTER — Other Ambulatory Visit: Payer: Self-pay | Admitting: Internal Medicine

## 2013-11-11 ENCOUNTER — Other Ambulatory Visit: Payer: Medicare Other

## 2013-11-13 ENCOUNTER — Ambulatory Visit: Payer: Medicare Other | Admitting: Internal Medicine

## 2013-11-17 ENCOUNTER — Telehealth: Payer: Self-pay | Admitting: *Deleted

## 2013-11-17 NOTE — Telephone Encounter (Signed)
CALLED PATIENT TO ASK ABOUT COMING IN FOR LABS ON 12-01-13, SPOKE WITH PATIENT AND SHE WANTS TO COME IN ON 12-01-13 @ 9 AM FOR STAT LABS PRIOR TO HER TEST.

## 2013-11-28 ENCOUNTER — Telehealth: Payer: Self-pay | Admitting: *Deleted

## 2013-11-28 NOTE — Telephone Encounter (Signed)
Called patient to inform that lab and test has been rescheduled for 12-03-13 due to insurance coverage of test, spoke with patient and she will get back with me on Monday to verify if she can make these appts.

## 2013-11-28 NOTE — Telephone Encounter (Signed)
CALLED PATIENT TO ASK ABOUT HER SCAN BEING ON 12-03-13 @ 8:30 AM @ [facility], DUE TO INSURANCE ISSUES, LVM FOR A RETURN CALL

## 2013-12-01 ENCOUNTER — Ambulatory Visit: Admission: RE | Admit: 2013-12-01 | Payer: No Typology Code available for payment source | Source: Ambulatory Visit

## 2013-12-01 ENCOUNTER — Ambulatory Visit (HOSPITAL_COMMUNITY): Payer: Medicare Other

## 2013-12-03 ENCOUNTER — Ambulatory Visit (HOSPITAL_COMMUNITY): Admission: RE | Admit: 2013-12-03 | Payer: Medicare Other | Source: Ambulatory Visit

## 2013-12-03 ENCOUNTER — Ambulatory Visit: Admission: RE | Admit: 2013-12-03 | Payer: Medicare Other | Source: Ambulatory Visit

## 2013-12-04 ENCOUNTER — Ambulatory Visit: Payer: Medicare Other | Admitting: Radiation Oncology

## 2013-12-17 ENCOUNTER — Encounter: Payer: Self-pay | Admitting: Nurse Practitioner

## 2013-12-22 ENCOUNTER — Other Ambulatory Visit: Payer: Self-pay | Admitting: *Deleted

## 2013-12-22 ENCOUNTER — Other Ambulatory Visit: Payer: Self-pay | Admitting: Nurse Practitioner

## 2014-01-09 ENCOUNTER — Other Ambulatory Visit: Payer: Self-pay | Admitting: Internal Medicine

## 2014-01-23 ENCOUNTER — Telehealth: Payer: Self-pay | Admitting: *Deleted

## 2014-01-23 NOTE — Telephone Encounter (Signed)
[name] with Hospice called and stated that patient is taking Lisinopril 40mg  once daily and her BP readings have been lower than normal. This morning it was 100/60 and last week it was 98/50. Nurse wants to know if you want any changes or what BP perimeters you want. Please Advise.

## 2014-01-26 NOTE — Telephone Encounter (Signed)
[name] with Hospice Notified.

## 2014-01-26 NOTE — Telephone Encounter (Signed)
Hold parameters for lisinopril:  Hold for sbp<100.

## 2014-04-14 ENCOUNTER — Encounter: Payer: Self-pay | Admitting: Internal Medicine

## 2014-04-28 ENCOUNTER — Telehealth: Payer: Self-pay | Admitting: *Deleted

## 2014-04-28 MED ORDER — INFLUENZA VAC SPLIT QUAD 0.5 ML IM SUSY: 0.5000 mL | PREFILLED_SYRINGE | Freq: Once | INTRAMUSCULAR | Status: AC

## 2014-04-28 NOTE — Telephone Encounter (Signed)
Faxed Rx to Pharmacy and Notified [name] with Hospice.

## 2014-04-28 NOTE — Telephone Encounter (Signed)
Fine with me.  She should take it.  Please fax order.  Thanks.

## 2014-04-28 NOTE — Telephone Encounter (Signed)
[name] with Hospice called and stated that patient would like to have a Flu Shot. Hospice nurse is willing to give it to her but needs it called into her pharmacy and an order to give it. Please Advise.  [facility] [employer] [facility] [address]

## 2014-05-20 ENCOUNTER — Other Ambulatory Visit: Payer: Self-pay | Admitting: Internal Medicine

## 2014-06-10 ENCOUNTER — Other Ambulatory Visit: Payer: Self-pay | Admitting: Internal Medicine

## 2014-11-11 ENCOUNTER — Encounter: Payer: Self-pay | Admitting: Internal Medicine

## 2015-03-17 ENCOUNTER — Encounter: Payer: Self-pay | Admitting: Internal Medicine

## 2015-07-22 ENCOUNTER — Other Ambulatory Visit: Payer: Self-pay | Admitting: *Deleted

## 2015-07-22 MED ORDER — ATORVASTATIN CALCIUM 10 MG PO TABS: 10.0000 mg | ORAL_TABLET | Freq: Every day | ORAL | Status: AC

## 2015-07-22 NOTE — Telephone Encounter (Signed)
Zemple patient

## 2015-12-01 ENCOUNTER — Telehealth: Payer: Self-pay

## 2015-12-01 NOTE — Telephone Encounter (Signed)
B/c of her being on hospice, we could fill it, but if hospice wants to order instead since they see her regularly, that does make more sense at this point.

## 2015-12-01 NOTE — Telephone Encounter (Signed)
Message left on triage voicemail: Patient needs refill on albuterol inhaler sent to [facility]   I called Renee and informed her patient not seen at out office since 2015 and we do not have an albuterol inhaler on the patient's medication list. I informed Renee that I will send to Dr.Reed for review and have her advise on refill request. Joseph Art stated she will further discuss with the hospice doctor and have them refill inhaler for patient.   I will still send message to Dr.Reed as a FYI and to see if another plan of action is to follow

## 2015-12-02 NOTE — Telephone Encounter (Signed)
Noted for future reference

## 2016-02-05 DEATH — deceased
